# Patient Record
Sex: Male | Born: 1935 | Race: White | Hispanic: No | Smoking: Former smoker
Health system: Southern US, Community
[De-identification: ages and names within clinical notes are randomized; demographics above are authoritative.]

## PROBLEM LIST (undated history)

## (undated) DIAGNOSIS — K219 Gastro-esophageal reflux disease without esophagitis: Secondary | ICD-10-CM

## (undated) DIAGNOSIS — K625 Hemorrhage of anus and rectum: Secondary | ICD-10-CM

## (undated) DIAGNOSIS — R2689 Other abnormalities of gait and mobility: Secondary | ICD-10-CM

## (undated) DIAGNOSIS — E7800 Pure hypercholesterolemia, unspecified: Secondary | ICD-10-CM

## (undated) DIAGNOSIS — G8929 Other chronic pain: Secondary | ICD-10-CM

## (undated) DIAGNOSIS — N3281 Overactive bladder: Secondary | ICD-10-CM

## (undated) DIAGNOSIS — H579 Unspecified disorder of eye and adnexa: Secondary | ICD-10-CM

## (undated) DIAGNOSIS — L309 Dermatitis, unspecified: Secondary | ICD-10-CM

## (undated) DIAGNOSIS — K5909 Other constipation: Secondary | ICD-10-CM

## (undated) DIAGNOSIS — R0602 Shortness of breath: Secondary | ICD-10-CM

## (undated) DIAGNOSIS — I509 Heart failure, unspecified: Secondary | ICD-10-CM

## (undated) DIAGNOSIS — H9190 Unspecified hearing loss, unspecified ear: Secondary | ICD-10-CM

## (undated) DIAGNOSIS — M2560 Stiffness of unspecified joint, not elsewhere classified: Secondary | ICD-10-CM

## (undated) DIAGNOSIS — M869 Osteomyelitis, unspecified: Secondary | ICD-10-CM

## (undated) DIAGNOSIS — K649 Unspecified hemorrhoids: Secondary | ICD-10-CM

## (undated) DIAGNOSIS — I499 Cardiac arrhythmia, unspecified: Secondary | ICD-10-CM

## (undated) DIAGNOSIS — M419 Scoliosis, unspecified: Secondary | ICD-10-CM

## (undated) DIAGNOSIS — N390 Urinary tract infection, site not specified: Secondary | ICD-10-CM

## (undated) DIAGNOSIS — M48062 Spinal stenosis, lumbar region with neurogenic claudication: Secondary | ICD-10-CM

## (undated) DIAGNOSIS — M6281 Muscle weakness (generalized): Secondary | ICD-10-CM

## (undated) DIAGNOSIS — G43909 Migraine, unspecified, not intractable, without status migrainosus: Secondary | ICD-10-CM

## (undated) DIAGNOSIS — I10 Essential (primary) hypertension: Secondary | ICD-10-CM

## (undated) DIAGNOSIS — L989 Disorder of the skin and subcutaneous tissue, unspecified: Secondary | ICD-10-CM

## (undated) DIAGNOSIS — R0989 Other specified symptoms and signs involving the circulatory and respiratory systems: Secondary | ICD-10-CM

## (undated) DIAGNOSIS — Z950 Presence of cardiac pacemaker: Secondary | ICD-10-CM

## (undated) DIAGNOSIS — K1379 Other lesions of oral mucosa: Secondary | ICD-10-CM

## (undated) DIAGNOSIS — M1990 Unspecified osteoarthritis, unspecified site: Secondary | ICD-10-CM

## (undated) DIAGNOSIS — M2550 Pain in unspecified joint: Secondary | ICD-10-CM

## (undated) DIAGNOSIS — R197 Diarrhea, unspecified: Secondary | ICD-10-CM

## (undated) HISTORY — DX: Diarrhea, unspecified: R19.7

## (undated) HISTORY — DX: Gastro-esophageal reflux disease without esophagitis: K21.9

## (undated) HISTORY — DX: Unspecified hearing loss, unspecified ear: H91.90

## (undated) HISTORY — DX: Other chronic pain: G89.29

## (undated) HISTORY — DX: Heart failure, unspecified: I50.9

## (undated) HISTORY — DX: Other lesions of oral mucosa: K13.79

## (undated) HISTORY — DX: Osteomyelitis, unspecified: M86.9

## (undated) HISTORY — DX: Muscle weakness (generalized): M62.81

## (undated) HISTORY — DX: Other abnormalities of gait and mobility: R26.89

## (undated) HISTORY — DX: Unspecified hemorrhoids: K64.9

## (undated) HISTORY — DX: Unspecified disorder of eye and adnexa: H57.9

## (undated) HISTORY — DX: Cardiac arrhythmia, unspecified: I49.9

## (undated) HISTORY — DX: Other specified symptoms and signs involving the circulatory and respiratory systems: R09.89

## (undated) HISTORY — DX: Pain in unspecified joint: M25.50

## (undated) HISTORY — DX: Pure hypercholesterolemia, unspecified: E78.00

## (undated) HISTORY — DX: Other constipation: K59.09

## (undated) HISTORY — DX: Shortness of breath: R06.02

## (undated) HISTORY — DX: Hemorrhage of anus and rectum: K62.5

## (undated) HISTORY — DX: Disorder of the skin and subcutaneous tissue, unspecified: L98.9

## (undated) HISTORY — DX: Essential (primary) hypertension: I10

## (undated) HISTORY — DX: Presence of cardiac pacemaker: Z95.0

## (undated) HISTORY — PX: INSERT / REPLACE / REMOVE PACEMAKER: SUR710

## (undated) HISTORY — DX: Scoliosis, unspecified: M41.9

## (undated) HISTORY — DX: Unspecified osteoarthritis, unspecified site: M19.90

## (undated) HISTORY — DX: Dermatitis, unspecified: L30.9

## (undated) HISTORY — DX: Spinal stenosis, lumbar region with neurogenic claudication: M48.062

## (undated) HISTORY — DX: Migraine, unspecified, not intractable, without status migrainosus: G43.909

## (undated) HISTORY — DX: Stiffness of unspecified joint, not elsewhere classified: M25.60

## (undated) HISTORY — DX: Urinary tract infection, site not specified: N39.0

## (undated) HISTORY — PX: DENTAL SURGERY: SHX609

## (undated) HISTORY — PX: OTHER SURGICAL HISTORY: SHX169

## (undated) HISTORY — DX: Overactive bladder: N32.81

---

## 2017-06-01 ENCOUNTER — Encounter: Payer: Self-pay | Admitting: Internal Medicine

## 2018-12-14 ENCOUNTER — Encounter: Payer: Self-pay | Admitting: Internal Medicine

## 2019-08-03 ENCOUNTER — Encounter: Payer: Self-pay | Admitting: Internal Medicine

## 2019-08-03 ENCOUNTER — Ambulatory Visit (INDEPENDENT_AMBULATORY_CARE_PROVIDER_SITE_OTHER): Payer: Medicare Other | Admitting: Internal Medicine

## 2019-08-03 ENCOUNTER — Other Ambulatory Visit: Payer: Self-pay

## 2019-08-03 VITALS — BP 118/64 | HR 70 | Ht 67.0 in | Wt 133.4 lb

## 2019-08-03 DIAGNOSIS — R0609 Other forms of dyspnea: Secondary | ICD-10-CM | POA: Diagnosis not present

## 2019-08-03 DIAGNOSIS — Z950 Presence of cardiac pacemaker: Secondary | ICD-10-CM

## 2019-08-03 DIAGNOSIS — R0600 Dyspnea, unspecified: Secondary | ICD-10-CM

## 2019-08-03 DIAGNOSIS — I442 Atrioventricular block, complete: Secondary | ICD-10-CM | POA: Diagnosis not present

## 2019-08-03 LAB — CUP PACEART INCLINIC DEVICE CHECK
Brady Statistic RA Percent Paced: 4 %
Brady Statistic RV Percent Paced: 99 %
Date Time Interrogation Session: 20200923040000
Implantable Lead Implant Date: 20090901
Implantable Lead Implant Date: 20090901
Implantable Lead Location: 753859
Implantable Lead Location: 753860
Implantable Lead Model: 4076
Implantable Lead Model: 4076
Implantable Pulse Generator Implant Date: 20180723
Lead Channel Impedance Value: 348 Ohm
Lead Channel Impedance Value: 894 Ohm
Lead Channel Pacing Threshold Amplitude: 0.7 V
Lead Channel Pacing Threshold Amplitude: 0.8 V
Lead Channel Pacing Threshold Pulse Width: 0.4 ms
Lead Channel Pacing Threshold Pulse Width: 0.4 ms
Lead Channel Sensing Intrinsic Amplitude: 5.3 mV
Lead Channel Setting Pacing Amplitude: 1.1 V
Lead Channel Setting Pacing Amplitude: 2 V
Lead Channel Setting Pacing Pulse Width: 0.4 ms
Lead Channel Setting Sensing Sensitivity: 2.5 mV
Pulse Gen Serial Number: 357327

## 2019-08-03 NOTE — Progress Notes (Signed)
ELECTROPHYSIOLOGY CONSULT NOTE  Patient ID: Justin Richmond, MRN: [id number], DOB/AGE: 12/22/1923 85 y.o. Admit date: (Not on file) Date of Consult: 08/03/2019  Primary Physician: Shirline Frees, MD Primary Cardiologist: new     Justin Richmond is a 85 y.o. male who is being seen today for the evaluation of pacemaker at the request of Dr Kenton Kingfisher .    HPI Justin Richmond is a 85 y.o. male SEEN to establish pacemaker followup.  Implanted for syncope 2009 with generator replacement 2018  No recurrent syncope  Largely nonambulatory 2/2 DOE but also limited by debility and weakness.  Dyspnea has been incompletely evaluated.  CT scan showed interstitial lung disease.  Pulmonary consultation was considered but never consummated.  There is no accompanying chest discomfort not withstanding his known coronary artery disease See Below   DATE TEST EF   2002 LHC    % Ramus severe Mild diffuse disease  11/14 MYOVIEW  36 % Mod anteroapical Fixed defect  6/18 Echo  74%     Past Medical History:  Diagnosis Date  . Chronic constipation   . Hemorrhoids   . Hypertension   . OAB (overactive bladder)   . Pacemaker   . Pure hypercholesterolemia   . Recurrent UTI   . Spinal stenosis, lumbar region with neurogenic claudication       Surgical History:  Past Surgical History:  Procedure Laterality Date  . INSERT / REPLACE / REMOVE PACEMAKER       Home Meds: Current Meds  Medication Sig  . aspirin EC 81 MG tablet Take 81 mg by mouth daily.  . carvedilol (COREG) 6.25 MG tablet Take 6.25 mg by mouth 2 (two) times daily with a meal.  . ROSUVASTATIN CALCIUM PO Take by mouth.    Allergies: No Known Allergies  Social History   Socioeconomic History  . Marital status: Unknown    Spouse name: Not on file  . Number of children: Not on file  . Years of education: Not on file  . Highest education level: Not on file  Occupational History  . Not on file  Social Needs  . Financial  resource strain: Not on file  . Food insecurity    Worry: Not on file    Inability: Not on file  . Transportation needs    Medical: Not on file    Non-medical: Not on file  Tobacco Use  . Smoking status: Former Smoker    Years: 2.00    Types: Cigarettes  . Smokeless tobacco: Never Used  Substance and Sexual Activity  . Alcohol use: Not on file  . Drug use: Not on file  . Sexual activity: Not on file  Lifestyle  . Physical activity    Days per week: Not on file    Minutes per session: Not on file  . Stress: Not on file  Relationships  . Social Herbalist on phone: Not on file    Gets together: Not on file    Attends religious service: Not on file    Active member of club or organization: Not on file    Attends meetings of clubs or organizations: Not on file    Relationship status: Not on file  . Intimate partner violence    Fear of current or ex partner: Not on file    Emotionally abused: Not on file    Physically abused: Not on file    Forced sexual activity: Not on file  Other  Topics Concern  . Not on file  Social History Narrative  . Not on file     No family history on file.   ROS:  Please see the history of present illness.     All other systems reviewed and negative.    Physical Exam: Blood pressure 118/64, pulse 70, height 5\' 7"  (1.702 m), weight 133 lb 6.4 oz (60.5 kg), SpO2 97 %. General: Well developed, well nourished male in no acute distress sitting in a wheel chair Head: Normocephalic, atraumatic, sclera non-icteric, no xanthomas, nares are without discharge. EENT: normal  Lymph Nodes:  none Neck: Negative for carotid bruits. JVD not elevated. Back:without scoliosis kyphosis Lungs: Clear bilaterally to auscultation without wheezes, rales, or rhonchi. Breathing is unlabored. Heart: RRR with S1 S2. No murmur . No rubs, or gallops appreciated. Abdomen: Soft, non-tender, non-distended with normoactive bowel sounds. No hepatomegaly. No  rebound/guarding. No obvious abdominal masses. Msk:  Strength and tone appear normal for age. Extremities: No clubbing or cyanosis. No edema.  Distal pedal pulses are 2+ and equal bilaterally. Skin: Warm and Dry Neuro: Alert and oriented X 3. CN III-XII intact Grossly normal sensory and motor function . Psych:  Responds to questions appropriately with a normal affect.      Labs: Cardiac Enzymes No results for input(s): CKTOTAL, CKMB, TROPONINI in the last 72 hours. CBC No results found for: WBC, HGB, HCT, MCV, PLT PROTIME: No results for input(s): LABPROT, INR in the last 72 hours. Chemistry No results for input(s): NA, K, CL, CO2, BUN, CREATININE, CALCIUM, PROT, BILITOT, ALKPHOS, ALT, AST, GLUCOSE in the last 168 hours.  Invalid input(s): LABALBU Lipids No results found for: CHOL, HDL, LDLCALC, TRIG BNP No results found for: PROBNP Thyroid Function Tests: No results for input(s): TSH, T4TOTAL, T3FREE, THYROIDAB in the last 72 hours.  Invalid input(s): FREET3 Miscellaneous No results found for: DDIMER  Radiology/Studies:  No results found.  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  Pacemaker function is normal/adequate.  Polarity switch occurred because of high bipolar impedance.  This normalized with reprogramming to unipolar.  I have reviewed the issue with the family.  Dyspnea is likely multifactorial related to debility, interstitial lung disease noted on CT scanning, and possibly some coronary artery disease resulting in ischemia.  Have suggested a follow-up with Dr. 01-30-2005 regarding further evaluation of his dyspnea, physical therapy as well as consideration of evaluation of his neuropathy.   We will see him in 3 to 4 months.         Tiburcio Pea

## 2019-08-03 NOTE — Patient Instructions (Addendum)
Medication Instructions:  Your physician recommends that you continue on your current medications as directed. Please refer to the Current Medication list given to you today.  *If you need a refill on your cardiac medications before your next appointment, please call your pharmacy*  Labwork: None ordered  Testing/Procedures: None ordered  Follow-Up: Remote monitoring is used to monitor your Pacemaker of ICD from home. This monitoring reduces the number of office visits required to check your device to one time per year. It allows [place] to keep an eye on the functioning of your device to ensure it is working properly. You are scheduled for a device check from home on 11/02/19. You may send your transmission at any time that day. If you have a wireless device, the transmission will be sent automatically. After your physician reviews your transmission, you will receive a postcard with your next transmission date.  Your physician recommends that you schedule a follow-up appointment in: 4 months with Dr. Caryl Comes.   Thank you for choosing [facility]!!   [phone]  Any Other Special Instructions Will Be Listed Below (If Applicable).

## 2019-09-07 ENCOUNTER — Other Ambulatory Visit: Payer: Self-pay

## 2019-09-07 ENCOUNTER — Ambulatory Visit (INDEPENDENT_AMBULATORY_CARE_PROVIDER_SITE_OTHER): Payer: Medicare Other | Admitting: Podiatry

## 2019-09-07 ENCOUNTER — Encounter: Payer: Self-pay | Admitting: Podiatry

## 2019-09-07 VITALS — BP 135/83 | HR 82

## 2019-09-07 DIAGNOSIS — G629 Polyneuropathy, unspecified: Secondary | ICD-10-CM | POA: Diagnosis not present

## 2019-09-07 DIAGNOSIS — B351 Tinea unguium: Secondary | ICD-10-CM | POA: Diagnosis not present

## 2019-09-07 DIAGNOSIS — M79674 Pain in right toe(s): Secondary | ICD-10-CM

## 2019-09-07 DIAGNOSIS — M79675 Pain in left toe(s): Secondary | ICD-10-CM | POA: Diagnosis not present

## 2019-09-09 NOTE — Progress Notes (Signed)
Subjective:   Patient ID: Justin Richmond, male   DOB: 85 y.o.   MRN: [id number]   HPI Patient states he gets some tingling in his feet if he is been on them a long time but he is having a lot of problems with his nails that have become very thick and incurvated and they cannot cut them patient presents with caregiver today.  Patient does not smoke likes to be active if possible   Review of Systems  All other systems reviewed and are negative.       Objective:  Physical Exam Vitals signs and nursing note reviewed.  Constitutional:      Appearance: He is well-developed.  Pulmonary:     Effort: Pulmonary effort is normal.  Musculoskeletal: Normal range of motion.  Skin:    General: Skin is warm.  Neurological:     Mental Status: He is alert.     Neurovascular status was found to be mildly diminished bilateral with patient found to have thick yellow brittle nailbeds 1-5 both feet that are dystrophic and moderately painful with palpation.  Patient is noted to have mild diminishment of digital perfusion and does have diminished muscle strength bilateral      Assessment:  Chronic mycotic nail infection with pain 1-5 both feet with possible mild neuropathic symptoms commensurate with age     Plan:  H&P reviewed conditions and at this point I did debride nailbeds 1-5 both feet with no iatrogenic bleeding and I recommended continuation of this treatment.  Patient will be seen back to recheck

## 2019-11-02 ENCOUNTER — Ambulatory Visit (INDEPENDENT_AMBULATORY_CARE_PROVIDER_SITE_OTHER): Payer: Medicare Other | Admitting: *Deleted

## 2019-11-02 DIAGNOSIS — I442 Atrioventricular block, complete: Secondary | ICD-10-CM

## 2019-11-03 LAB — CUP PACEART REMOTE DEVICE CHECK
Date Time Interrogation Session: 20201223055718
Implantable Lead Implant Date: 20090901
Implantable Lead Implant Date: 20090901
Implantable Lead Location: 753859
Implantable Lead Location: 753860
Implantable Lead Model: 4076
Implantable Lead Model: 4076
Implantable Pulse Generator Implant Date: 20180723
Pulse Gen Serial Number: 357327

## 2019-12-09 ENCOUNTER — Encounter: Payer: Self-pay | Admitting: Podiatry

## 2019-12-09 ENCOUNTER — Ambulatory Visit (INDEPENDENT_AMBULATORY_CARE_PROVIDER_SITE_OTHER): Payer: Medicare Other | Admitting: Podiatry

## 2019-12-09 ENCOUNTER — Other Ambulatory Visit: Payer: Self-pay

## 2019-12-09 DIAGNOSIS — G629 Polyneuropathy, unspecified: Secondary | ICD-10-CM

## 2019-12-09 DIAGNOSIS — B351 Tinea unguium: Secondary | ICD-10-CM

## 2019-12-09 DIAGNOSIS — M79675 Pain in left toe(s): Secondary | ICD-10-CM | POA: Diagnosis not present

## 2019-12-09 DIAGNOSIS — M79674 Pain in right toe(s): Secondary | ICD-10-CM

## 2019-12-09 NOTE — Progress Notes (Signed)
Complaint:  Visit Type: Patient returns to my office for continued preventative foot care services. Complaint: Patient states" my nails have grown long and thick and become painful to walk and wear shoes" Patient has been diagnosed with neuropathy.. The patient presents for preventative foot care services.  Podiatric Exam: Vascular: dorsalis pedis and posterior tibial pulses are palpable bilateral. Capillary return is immediate. Temperature gradient is WNL. Skin turgor WNL  Sensorium: Normal Semmes Weinstein monofilament test. Normal tactile sensation bilaterally. Nail Exam: Pt has thick disfigured discolored nails with subungual debris noted bilateral entire nail hallux through fifth toenails Ulcer Exam: There is no evidence of ulcer or pre-ulcerative changes or infection. Orthopedic Exam: Muscle tone and strength are WNL. No limitations in general ROM. No crepitus or effusions noted. Foot type and digits show no abnormalities. Bony prominences are unremarkable. Skin: No Porokeratosis. No infection or ulcers  Diagnosis:  Onychomycosis, , Pain in right toe, pain in left toes  Treatment & Plan Procedures and Treatment: Consent by patient was obtained for treatment procedures.   Debridement of mycotic and hypertrophic toenails, 1 through 5 bilateral and clearing of subungual debris. No ulceration, no infection noted.  Return Visit-Office Procedure: Patient instructed to return to the office for a follow up visit 4 months for continued evaluation and treatment.    Helane Gunther DPM

## 2019-12-13 ENCOUNTER — Encounter: Payer: Medicare Other | Admitting: Student

## 2019-12-15 ENCOUNTER — Encounter: Payer: Medicare Other | Admitting: Student

## 2020-02-01 ENCOUNTER — Ambulatory Visit (INDEPENDENT_AMBULATORY_CARE_PROVIDER_SITE_OTHER): Payer: Medicare Other | Admitting: *Deleted

## 2020-02-01 DIAGNOSIS — I442 Atrioventricular block, complete: Secondary | ICD-10-CM | POA: Diagnosis not present

## 2020-02-02 LAB — CUP PACEART REMOTE DEVICE CHECK
Battery Remaining Longevity: 90 mo
Battery Remaining Percentage: 100 %
Brady Statistic RA Percent Paced: 3 %
Brady Statistic RV Percent Paced: 99 %
Date Time Interrogation Session: 20210325094700
Implantable Lead Implant Date: 20090901
Implantable Lead Implant Date: 20090901
Implantable Lead Location: 753859
Implantable Lead Location: 753860
Implantable Lead Model: 4076
Implantable Lead Model: 4076
Implantable Pulse Generator Implant Date: 20180723
Lead Channel Impedance Value: 323 Ohm
Lead Channel Impedance Value: 927 Ohm
Lead Channel Pacing Threshold Amplitude: 0.8 V
Lead Channel Pacing Threshold Pulse Width: 0.4 ms
Lead Channel Setting Pacing Amplitude: 1.3 V
Lead Channel Setting Pacing Amplitude: 2 V
Lead Channel Setting Pacing Pulse Width: 0.4 ms
Lead Channel Setting Sensing Sensitivity: 2.5 mV
Pulse Gen Serial Number: 357327

## 2020-02-09 ENCOUNTER — Encounter: Payer: Self-pay | Admitting: *Deleted

## 2020-02-09 ENCOUNTER — Ambulatory Visit: Payer: Medicare Other | Admitting: Neurology

## 2020-02-09 ENCOUNTER — Telehealth: Payer: Self-pay | Admitting: Neurology

## 2020-02-09 NOTE — Telephone Encounter (Signed)
Patient showed up 10 minutes late to his appointment. I advised him that we would have to reschedule. Patient did not wish to reschedule.

## 2020-03-28 NOTE — Progress Notes (Signed)
[facility]    Provider:  Dr Jaynee Eagles Requesting Provider: Shirline Frees, MD, Suella Broad MD Primary Care Provider:  Shirline Frees, MD  CC:  Numbness in the feet  HPI:  Justin Richmond is a 86 y.o. male here as requested by Suella Broad and Dr. Kenton Kingfisher for numbness, weakness in legs, spinal stenosis but no pain.  Past medical history spinal stenosis of the lumbar region with neurogenic claudication, scoliosis, hypercholesterolemia, poor circulation, pacemaker for complete heart block, osteoarthritis, muscle weakness, migraine, joint stiffness and pain, hypertension, hearing loss, balance problems.  I reviewed Dr. Lurena Nida notes: His chief complaint was 5 years of low back pain, right leg pain and neck pain as well, 5 years "nagging pain", uses Tylenol or Aleve, previous injections gave him some nerve blocks in the past which was 5 years ago which lasted several years.  He also reports numbness in both of his feet, not a great historian accompanied with his daughter, also bilateral lower limb numbness, no pain more numbness, no history of diabetes.  Strength was 5 out of 5 but reflexes were difficult to get in either lower extremity, Hoffmann sign negative, he was seated in a wheelchair.  Not really complaining of pain in his neck or lumbar spine however biggest complaint is numbness and paresthesias in both lower extremities.  I was also able to review notes from Dr. Kenton Kingfisher who is his primary care, physical exam by Dr. Kenton Kingfisher appeared unremarkable, he was referred here for weakness of both legs by Dr. Kenton Kingfisher as well.  It started with low back pain and radiculopathy. He had 3 injections in the low back and the pain improved. He did not have this prior to the back pain. It has bene stable since the not worsening but not improving. The right one is worse. He has a difficult time telling me where, he have numbness up to the right knee, an din the left it is in the foot. No new  medication, not exposed to toxins, no cancer therapies. He lived for tell years at a care facility. Just numb. No burning or pain, he is having electrical shocks not painful but noticeable, numbness is continuous all day and all night, worse at night because he is inactive.Hand and feet always cold. No FHx of neuropathy. His son is diabetic but no history of diabetes. Here with daughter who provides much information.   Reviewed notes, labs and imaging from outside physicians, which showed:  See above  Review of Systems: Patient complains of symptoms per HPI as well as the following symptoms: low back pain ad numbness. Pertinent negatives and positives per HPI. All others negative.    Social History   Socioeconomic History  . Marital status: Widowed    Spouse name: Not on file  . Number of children: 4  . Years of education: Not on file  . Highest education level: Professional school degree (e.g., MD, DDS, DVM, JD)  Occupational History  . Not on file  Tobacco Use  . Smoking status: Former Smoker    Years: 2.00    Types: Cigarettes  . Smokeless tobacco: Never Used  Substance and Sexual Activity  . Alcohol use: Never    Comment: in college  . Drug use: Not on file    Comment: no  . Sexual activity: Not on file  Other Topics Concern  . Not on file  Social History Narrative   Lives with daughter   Right handed   Caffeine: 1 cup/day  Social Determinants of Health   Financial Resource Strain:   . Difficulty of Paying Living Expenses:   Food Insecurity:   . Worried About Charity fundraiser in the Last Year:   . Arboriculturist in the Last Year:   Transportation Needs:   . Film/video editor (Medical):   Marland Kitchen Lack of Transportation (Non-Medical):   Physical Activity:   . Days of Exercise per Week:   . Minutes of Exercise per Session:   Stress:   . Feeling of Stress :   Social Connections:   . Frequency of Communication with Friends and Family:   . Frequency of Social  Gatherings with Friends and Family:   . Attends Religious Services:   . Active Member of Clubs or Organizations:   . Attends Archivist Meetings:   Marland Kitchen Marital Status:   Intimate Partner Violence:   . Fear of Current or Ex-Partner:   . Emotionally Abused:   Marland Kitchen Physically Abused:   . Sexually Abused:     Family History  Problem Relation Age of Onset  . Stroke Mother   . Other Father        "black lung disease"  . Cancer Sister   . Ankylosing spondylitis Brother     Past Medical History:  Diagnosis Date  . Arrhythmia   . Balance problems   . Chronic back pain   . Chronic constipation   . Congestive heart failure ([place])   . Diarrhea   . Eczema   . Eye disease    cataracts/glaucoma  . GERD (gastroesophageal reflux disease)   . Hearing loss   . Hemorrhoids   . High cholesterol   . Hypertension   . Irregular heartbeat   . Joint pain   . Joint stiffness   . Migraine headache   . Mouth sores   . Muscle weakness   . OAB (overactive bladder)   . Osteoarthritis   . Pacemaker   . Poor circulation   . Pure hypercholesterolemia   . Rectal bleeding   . Recurrent UTI   . Scoliosis   . Shortness of breath   . Skin disorder   . Spinal stenosis, lumbar region with neurogenic claudication     Patient Active Problem List   Diagnosis Date Noted  . Peripheral polyneuropathy 04/02/2020  . Spinal stenosis of lumbar region with neurogenic claudication 04/02/2020    Past Surgical History:  Procedure Laterality Date  . DENTAL SURGERY     tooth removal, root canals  . INSERT / REPLACE / REMOVE PACEMAKER    . osteomyelitis surgery     x10 surgeries  . SPINAL INJECTION     x3     Current Outpatient Medications  Medication Sig Dispense Refill  . aspirin EC 81 MG tablet Take 81 mg by mouth daily.    . carvedilol (COREG) 6.25 MG tablet Take 6.25 mg by mouth 2 (two) times daily with a meal.    . pantoprazole (PROTONIX) 40 MG tablet Take 40 mg by mouth daily.    .  rosuvastatin (CRESTOR) 10 MG tablet      No current facility-administered medications for this visit.    Allergies as of 03/29/2020 - Review Complete 03/29/2020  Allergen Reaction Noted  . Penicillins  03/29/2020    Vitals: BP (!) 184/97 (BP Location: Right Arm, Patient Position: Sitting) Comment: "it happens everytime I go to a doctors office"  Pulse 75   Ht '5\' 7"'  (1.702 m)  Wt 143 lb (64.9 kg)   BMI 22.40 kg/m  Last Weight:  Wt Readings from Last 1 Encounters:  03/29/20 143 lb (64.9 kg)   Last Height:   Ht Readings from Last 1 Encounters:  03/29/20 '5\' 7"'  (1.702 m)     Physical exam: Exam: Gen: NAD, conversant, looks much younger than stated age                    CV: RRR, no MRG. No Carotid Bruits.  mild swelling in the distal lower right leg., warm, nontender Eyes: Conjunctivae clear without exudates or hemorrhage  Neuro: Detailed Neurologic Exam  Speech:    Speech is normal; fluent and spontaneous with normal comprehension.  Cognition:    The patient is oriented to person, place, and time;     recent and remote memory intact;     language fluent;     normal attention, concentration,     fund of knowledge Cranial Nerves:    The pupils are equal, round, pinpoint. Attempted fundoscopy could not visualize due to small pupils. Visual fields are full to finger confrontation. Extraocular movements are intact. Trigeminal sensation is intact and the muscles of mastication are normal. The face is symmetric. The palate elevates in the midline. Hearing intact. Voice is normal. Shoulder shrug is normal. The tongue has normal motion without fasciculations.   Coordination: No dysmetria  Gait:    Uses a walker, slow but not shuffling or ataxic  Motor Observation:  Left lower leg with atrophy and a large scar (chronic from age 63). left knee fusion cannot bed.  Tone:    Normal muscle tone.    Posture:    Posture is normal. normal erect    Strength: very minimal prox  weakness but symmetrical and not pathological.     Strength is V/V in the upper and lower limbs.      Sensation: Left foot intact pp, right foot decreased pp to the ankle, decreased temp left foot and the right as well. Intact vibration a few seconds, impaired proprioception.       Reflex Exam:  DTR's:    left knee fusion cannot bed. Trace AJs, 1+ right patellar, biceps 1+.   Toes:    The toes are downgoing bilaterally.   Clonus:    Clonus is absent.    Assessment/Plan:  86 y.o. male here as requested by Suella Broad and Dr. Kenton Kingfisher for numbness, weakness in legs, spinal stenosis but no pain.  Past medical history spinal stenosis of the lumbar region with neurogenic claudication, scoliosis, hypercholesterolemia, poor circulation, pacemaker for complete heart block, osteoarthritis, muscle weakness, migraine, joint stiffness and pain, hypertension, hearing loss, balance problems  -86 year old patient who looks much younger than stated age.  He has an interesting story he states that he developed lumbar stenosis with radiculopathy and current numbness in the feet at the same time.  Pain management with injections improve the radicular pain however he is left with stable numbness in the bilateral lower extremities.  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  I did explain to patient and daughter that after many years it is unlikely numbness will improve with any treatment however we will perform a complete serum neuropathy panel  - and EMG nerve conduction study only on the right leg.   Orders Placed This Encounter  Procedures  .  Hemoglobin A1c  . B. burgdorfi Antibody  . Vitamin B1  . TSH  . Sedimentation rate  . Sjogren's syndrome antibods(ssa + ssb)  . B12 and Folate Panel  . Tissue transglutaminase, IgA  . Gliadin antibodies, serum  .  Rheumatoid factor  . Heavy metals, blood  . Vitamin B6  . Multiple Myeloma Panel (SPEP&IFE w/QIG)  . Methylmalonic acid, serum  . ANA, IFA (with reflex)  . CBC  . Comprehensive metabolic panel  . NCV with EMG(electromyography)   No orders of the defined types were placed in this encounter.   Cc: Shirline Frees, MD,   Suella Broad MD  Sarina Ill, MD  [facility] [address] [place], [place] [address]  Phone [phone] Fax [phone]  I spent 60  minutes of face-to-face and non-face-to-face time with patient on the  1. Peripheral polyneuropathy   2. Spinal stenosis of lumbar region with neurogenic claudication    diagnosis.  This included previsit chart review, lab review, study review, order entry, electronic health record documentation, patient education on the different diagnostic and therapeutic options, counseling and coordination of care, risks and benefits of management, compliance, or risk factor reduction

## 2020-03-29 ENCOUNTER — Other Ambulatory Visit: Payer: Self-pay

## 2020-03-29 ENCOUNTER — Ambulatory Visit (INDEPENDENT_AMBULATORY_CARE_PROVIDER_SITE_OTHER): Payer: Medicare Other | Admitting: Neurology

## 2020-03-29 ENCOUNTER — Encounter: Payer: Self-pay | Admitting: Neurology

## 2020-03-29 VITALS — BP 184/97 | HR 75 | Ht 67.0 in | Wt 143.0 lb

## 2020-03-29 DIAGNOSIS — M48062 Spinal stenosis, lumbar region with neurogenic claudication: Secondary | ICD-10-CM | POA: Diagnosis not present

## 2020-03-29 DIAGNOSIS — G629 Polyneuropathy, unspecified: Secondary | ICD-10-CM | POA: Diagnosis not present

## 2020-03-29 NOTE — Patient Instructions (Addendum)
Peripheral Neuropathy Peripheral neuropathy is a type of nerve damage. It affects nerves that carry signals between the spinal cord and the arms, legs, and the rest of the body (peripheral nerves). It does not affect nerves in the spinal cord or brain. In peripheral neuropathy, one nerve or a group of nerves may be damaged. Peripheral neuropathy is a broad category that includes many specific nerve disorders, like diabetic neuropathy, hereditary neuropathy, and carpal tunnel syndrome. What are the causes? This condition may be caused by:  Diabetes. This is the most common cause of peripheral neuropathy.  Nerve injury.  Pressure or stress on a nerve that lasts a long time.  Lack (deficiency) of B vitamins. This can result from alcoholism, poor diet, or a restricted diet.  Infections.  Autoimmune diseases, such as rheumatoid arthritis and systemic lupus erythematosus.  Nerve diseases that are passed from parent to child (inherited).  Some medicines, such as cancer medicines (chemotherapy).  Poisonous (toxic) substances, such as lead and mercury.  Too little blood flowing to the legs.  Kidney disease.  Thyroid disease. In some cases, the cause of this condition is not known. What are the signs or symptoms? Symptoms of this condition depend on which of your nerves is damaged. Common symptoms include:  Loss of feeling (numbness) in the feet, hands, or both.  Tingling in the feet, hands, or both.  Burning pain.  Very sensitive skin.  Weakness.  Not being able to move a part of the body (paralysis).  Muscle twitching.  Clumsiness or poor coordination.  Loss of balance.  Not being able to control your bladder.  Feeling dizzy.  Sexual problems. How is this diagnosed? Diagnosing and finding the cause of peripheral neuropathy can be difficult. Your health care provider will take your medical history and do a physical exam. A neurological exam will also be done. This  involves checking things that are affected by your brain, spinal cord, and nerves (nervous system). For example, your health care provider will check your reflexes, how you move, and what you can feel. You may have other tests, such as:  Blood tests.  Electromyogram (EMG) and nerve conduction tests. These tests check nerve function and how well the nerves are controlling the muscles.  Imaging tests, such as CT scans or MRI to rule out other causes of your symptoms.  Removing a small piece of nerve to be examined in a lab (nerve biopsy). This is rare.  Removing and examining a small amount of the fluid that surrounds the brain and spinal cord (lumbar puncture). This is rare. How is this treated? Treatment for this condition may involve:  Treating the underlying cause of the neuropathy, such as diabetes, kidney disease, or vitamin deficiencies.  Stopping medicines that can cause neuropathy, such as chemotherapy.  Medicine to relieve pain. Medicines may include: ? Prescription or over-the-counter pain medicine. ? Antiseizure medicine. ? Antidepressants. ? Pain-relieving patches that are applied to painful areas of skin.  Surgery to relieve pressure on a nerve or to destroy a nerve that is causing pain.  Physical therapy to help improve movement and balance.  Devices to help you move around (assistive devices). Follow these instructions at home: Medicines  Take over-the-counter and prescription medicines only as told by your health care provider. Do not take any other medicines without first asking your health care provider.  Do not drive or use heavy machinery while taking prescription pain medicine. Lifestyle   Do not use any products that  contain nicotine or tobacco, such as cigarettes and e-cigarettes. Smoking keeps blood from reaching damaged nerves. If you need help quitting, ask your health care provider.  Avoid or limit alcohol. Too much alcohol can cause a vitamin B  deficiency, and vitamin B is needed for healthy nerves.  Eat a healthy diet. This includes: ? Eating foods that are high in fiber, such as fresh fruits and vegetables, whole grains, and beans. ? Limiting foods that are high in fat and processed sugars, such as fried or sweet foods. General instructions   If you have diabetes, work closely with your health care provider to keep your blood sugar under control.  If you have numbness in your feet: ? Check every day for signs of injury or infection. Watch for redness, warmth, and swelling. ? Wear padded socks and comfortable shoes. These help protect your feet.  Develop a good support system. Living with peripheral neuropathy can be stressful. Consider talking with a mental health specialist or joining a support group.  Use assistive devices and attend physical therapy as told by your health care provider. This may include using a walker or a cane.  Keep all follow-up visits as told by your health care provider. This is important. Contact a health care provider if:  You have new signs or symptoms of peripheral neuropathy.  You are struggling emotionally from dealing with peripheral neuropathy.  Your pain is not well-controlled. Get help right away if:  You have an injury or infection that is not healing normally.  You develop new weakness in an arm or leg.  You fall frequently. Summary  Peripheral neuropathy is when the nerves in the arms, or legs are damaged, resulting in numbness, weakness, or pain.  There are many causes of peripheral neuropathy, including diabetes, pinched nerves, vitamin deficiencies, autoimmune disease, and hereditary conditions.  Diagnosing and finding the cause of peripheral neuropathy can be difficult. Your health care provider will take your medical history, do a physical exam, and do tests, including blood tests and nerve function tests.  Treatment involves treating the underlying cause of the  neuropathy and taking medicines to help control pain. Physical therapy and assistive devices may also help. This information is not intended to replace advice given to you by your health care provider. Make sure you discuss any questions you have with your health care provider. Document Revised: 10/09/2017 Document Reviewed: 01/05/2017 Elsevier Patient Education  2020 [employer]    Electromyoneurogram Electromyoneurogram is a test to check how well your muscles and nerves are working. This procedure includes the combined use of electromyogram (EMG) and nerve conduction study (NCS). EMG is used to look for muscular disorders. NCS, which is also called electroneurogram, measures how well your nerves are controlling your muscles. The procedures are usually done together to check if your muscles and nerves are healthy. If the results of the tests are abnormal, this may indicate disease or injury, such as a neuromuscular disease or peripheral nerve damage. Tell a health care provider about:  Any allergies you have.  All medicines you are taking, including vitamins, herbs, eye drops, creams, and over-the-counter medicines.  Any problems you or family members have had with anesthetic medicines.  Any blood disorders you have.  Any surgeries you have had.  Any medical conditions you have.  If you have a pacemaker.  Whether you are pregnant or may be pregnant. What are the risks? Generally, this is a safe procedure. However, problems may occur, including:  Infection  where the electrodes were inserted.  Bleeding. What happens before the procedure? Medicines Ask your health care provider about:  Changing or stopping your regular medicines. This is especially important if you are taking diabetes medicines or blood thinners.  Taking medicines such as aspirin and ibuprofen. These medicines can thin your blood. Do not take these medicines unless your health care provider tells you to take  them.  Taking over-the-counter medicines, vitamins, herbs, and supplements. General instructions  Your health care provider may ask you to avoid: ? Beverages that have caffeine, such as coffee and tea. ? Any products that contain nicotine or tobacco. These products include cigarettes, e-cigarettes, and chewing tobacco. If you need help quitting, ask your health care provider.  Do not use lotions or creams on the same day that you will be having the procedure. What happens during the procedure? For EMG   Your health care provider will ask you to stay in a position so that he or she can access the muscle that will be studied. You may be standing, sitting, or lying down.  You may be given a medicine that numbs the area (local anesthetic).  A very thin needle that has an electrode will be inserted into your muscle.  Another small electrode will be placed on your skin near the muscle.  Your health care provider will ask you to continue to remain still.  The electrodes will send a signal that tells about the electrical activity of your muscles. You may see this on a monitor or hear it in the room.  After your muscles have been studied at rest, your health care provider will ask you to contract or flex your muscles. The electrodes will send a signal that tells about the electrical activity of your muscles.  Your health care provider will remove the electrodes and the electrode needles when the procedure is finished. The procedure may vary among health care providers and hospitals. For NCS   An electrode that records your nerve activity (recording electrode) will be placed on your skin by the muscle that is being studied.  An electrode that is used as a reference (reference electrode) will be placed near the recording electrode.  A paste or gel will be applied to your skin between the recording electrode and the reference electrode.  Your nerve will be stimulated with a mild shock.  Your health care provider will measure how much time it takes for your muscle to react.  Your health care provider will remove the electrodes and the gel when the procedure is finished. The procedure may vary among health care providers and hospitals. What happens after the procedure?  It is up to you to get the results of your procedure. Ask your health care provider, or the department that is doing the procedure, when your results will be ready.  Your health care provider may: ? Give you medicines for any pain. ? Monitor the insertion sites to make sure that bleeding stops. Summary  Electromyoneurogram is a test to check how well your muscles and nerves are working.  If the results of the tests are abnormal, this may indicate disease or injury.  This is a safe procedure. However, problems may occur, such as bleeding and infection.  Your health care provider will do two tests to complete this procedure. One checks your muscles (EMG) and another checks your nerves (NCS).  It is up to you to get the results of your procedure. Ask your health care provider, or  the department that is doing the procedure, when your results will be ready. This information is not intended to replace advice given to you by your health care provider. Make sure you discuss any questions you have with your health care provider. Document Revised: 07/13/2018 Document Reviewed: 06/25/2018 Elsevier Patient Education  [place].

## 2020-04-02 ENCOUNTER — Telehealth: Payer: Self-pay | Admitting: Emergency Medicine

## 2020-04-02 ENCOUNTER — Encounter: Payer: Self-pay | Admitting: Neurology

## 2020-04-02 DIAGNOSIS — M48062 Spinal stenosis, lumbar region with neurogenic claudication: Secondary | ICD-10-CM | POA: Insufficient documentation

## 2020-04-02 DIAGNOSIS — G629 Polyneuropathy, unspecified: Secondary | ICD-10-CM | POA: Insufficient documentation

## 2020-04-02 NOTE — Telephone Encounter (Signed)
Remote transmission sent to verify RV threshold was 0.5 V @ 0.4 ms.

## 2020-04-04 LAB — CBC
Hematocrit: 40.6 % (ref 37.5–51.0)
Hemoglobin: 13.6 g/dL (ref 13.0–17.7)
MCH: 31.5 pg (ref 26.6–33.0)
MCHC: 33.5 g/dL (ref 31.5–35.7)
MCV: 94 fL (ref 79–97)
Platelets: 231 10*3/uL (ref 150–450)
RBC: 4.32 x10E6/uL (ref 4.14–5.80)
RDW: 12.7 % (ref 11.6–15.4)
WBC: 8.7 10*3/uL (ref 3.4–10.8)

## 2020-04-04 LAB — MULTIPLE MYELOMA PANEL, SERUM
Albumin SerPl Elph-Mcnc: 3.8 g/dL (ref 2.9–4.4)
Albumin/Glob SerPl: 1 (ref 0.7–1.7)
Alpha 1: 0.2 g/dL (ref 0.0–0.4)
Alpha2 Glob SerPl Elph-Mcnc: 1.1 g/dL — ABNORMAL HIGH (ref 0.4–1.0)
B-Globulin SerPl Elph-Mcnc: 1.3 g/dL (ref 0.7–1.3)
Gamma Glob SerPl Elph-Mcnc: 1.4 g/dL (ref 0.4–1.8)
Globulin, Total: 4 g/dL — ABNORMAL HIGH (ref 2.2–3.9)
IgA/Immunoglobulin A, Serum: 474 mg/dL — ABNORMAL HIGH (ref 61–437)
IgG (Immunoglobin G), Serum: 1409 mg/dL (ref 603–1613)
IgM (Immunoglobulin M), Srm: 32 mg/dL (ref 15–143)

## 2020-04-04 LAB — COMPREHENSIVE METABOLIC PANEL
ALT: 16 IU/L (ref 0–44)
AST: 24 IU/L (ref 0–40)
Albumin/Globulin Ratio: 1.2 (ref 1.2–2.2)
Albumin: 4.3 g/dL (ref 3.5–4.6)
Alkaline Phosphatase: 70 IU/L (ref 48–121)
BUN/Creatinine Ratio: 26 — ABNORMAL HIGH (ref 10–24)
BUN: 24 mg/dL (ref 10–36)
Bilirubin Total: 0.4 mg/dL (ref 0.0–1.2)
CO2: 26 mmol/L (ref 20–29)
Calcium: 9.6 mg/dL (ref 8.6–10.2)
Chloride: 101 mmol/L (ref 96–106)
Creatinine, Ser: 0.94 mg/dL (ref 0.76–1.27)
GFR calc Af Amer: 80 mL/min/{1.73_m2} (ref >59)
GFR calc non Af Amer: 70 mL/min/{1.73_m2} (ref >59)
Globulin, Total: 3.5 g/dL (ref 1.5–4.5)
Glucose: 98 mg/dL (ref 65–99)
Potassium: 5.3 mmol/L — ABNORMAL HIGH (ref 3.5–5.2)
Sodium: 139 mmol/L (ref 134–144)
Total Protein: 7.8 g/dL (ref 6.0–8.5)

## 2020-04-04 LAB — ANTINUCLEAR ANTIBODIES, IFA: ANA Titer 1: NEGATIVE

## 2020-04-04 LAB — HEAVY METALS, BLOOD
Arsenic: 4 ug/L (ref 2–23)
Lead, Blood: 1 ug/dL (ref 0–4)
Mercury: 2.2 ug/L (ref 0.0–14.9)

## 2020-04-04 LAB — GLIADIN ANTIBODIES, SERUM
Antigliadin Abs, IgA: 6 units (ref 0–19)
Gliadin IgG: 2 units (ref 0–19)

## 2020-04-04 LAB — SEDIMENTATION RATE: Sed Rate: 37 mm/hr — ABNORMAL HIGH (ref 0–30)

## 2020-04-04 LAB — B. BURGDORFI ANTIBODIES: Lyme IgG/IgM Ab: <0.91 {ISR} (ref 0.00–0.90)

## 2020-04-04 LAB — HEMOGLOBIN A1C
Est. average glucose Bld gHb Est-mCnc: 123 mg/dL
Hgb A1c MFr Bld: 5.9 % — ABNORMAL HIGH (ref 4.8–5.6)

## 2020-04-04 LAB — TSH: TSH: 1.85 u[IU]/mL (ref 0.450–4.500)

## 2020-04-04 LAB — VITAMIN B1: Thiamine: 137.9 nmol/L (ref 66.5–200.0)

## 2020-04-04 LAB — B12 AND FOLATE PANEL
Folate: 10.9 ng/mL (ref >3.0)
Vitamin B-12: >2000 pg/mL — ABNORMAL HIGH (ref 232–1245)

## 2020-04-04 LAB — TISSUE TRANSGLUTAMINASE, IGA: Transglutaminase IgA: <2 U/mL (ref 0–3)

## 2020-04-04 LAB — RHEUMATOID FACTOR: Rheumatoid fact SerPl-aCnc: <10 IU/mL (ref 0.0–13.9)

## 2020-04-04 LAB — SJOGREN'S SYNDROME ANTIBODS(SSA + SSB)
ENA SSA (RO) Ab: <0.2 AI (ref 0.0–0.9)
ENA SSB (LA) Ab: <0.2 AI (ref 0.0–0.9)

## 2020-04-04 LAB — VITAMIN B6: Vitamin B6: 6.2 ug/L (ref 5.3–46.7)

## 2020-04-04 LAB — METHYLMALONIC ACID, SERUM: Methylmalonic Acid: 136 nmol/L (ref 0–378)

## 2020-04-05 ENCOUNTER — Telehealth: Payer: Self-pay | Admitting: *Deleted

## 2020-04-05 NOTE — Telephone Encounter (Signed)
Spoke with pt and advised he is prediabetic per lab results and suggested he follow up with PCP. Pt aware other labs looked fine and we will see him for EMG/NCS on June 28th @ 1:15 pm. Pt verbalized appreciation and his questions were answered.

## 2020-04-05 NOTE — Telephone Encounter (Signed)
-----   Message from Anson Fret, MD sent at 04/03/2020  2:36 PM EDT ----- Patient is ore-diabetic otherwise labs look fine, will see him for emg/ncs thanks.

## 2020-04-06 ENCOUNTER — Ambulatory Visit (INDEPENDENT_AMBULATORY_CARE_PROVIDER_SITE_OTHER): Payer: Medicare Other | Admitting: Podiatry

## 2020-04-06 ENCOUNTER — Other Ambulatory Visit: Payer: Self-pay

## 2020-04-06 ENCOUNTER — Encounter: Payer: Self-pay | Admitting: Podiatry

## 2020-04-06 VITALS — Temp 97.7°F

## 2020-04-06 DIAGNOSIS — M79675 Pain in left toe(s): Secondary | ICD-10-CM

## 2020-04-06 DIAGNOSIS — B351 Tinea unguium: Secondary | ICD-10-CM | POA: Diagnosis not present

## 2020-04-06 DIAGNOSIS — M79674 Pain in right toe(s): Secondary | ICD-10-CM | POA: Diagnosis not present

## 2020-04-06 NOTE — Progress Notes (Signed)
This patient returns to my office for at risk foot care.  This patient requires this care by a professional since this patient will be at risk due to having peripheral polyneuropathy.  Patient presents to the office in a wheelchair accompanied by his daughter. This patient is unable to cut nails himself since the patient cannot reach his nails.These nails are painful walking and wearing shoes.  This patient presents for at risk foot care today.  General Appearance  Alert, conversant and in no acute stress.  Vascular  Dorsalis pedis and posterior tibial  pulses are palpable  bilaterally.  Capillary return is within normal limits  bilaterally. Temperature is within normal limits  bilaterally.  Neurologic  Senn-Weinstein monofilament wire test within normal limits  bilaterally. Muscle power within normal limits bilaterally.  Nails Thick disfigured discolored nails with subungual debris  from hallux to fifth toes bilaterally. No evidence of bacterial infection or drainage bilaterally.  Orthopedic  No limitations of motion  feet .  No crepitus or effusions noted.  No bony pathology or digital deformities noted.  Skin  normotropic skin with no porokeratosis noted bilaterally.  No signs of infections or ulcers noted.     Onychomycosis  Pain in right toes  Pain in left toes  Consent was obtained for treatment procedures.   Mechanical debridement of nails 1-5  bilaterally performed with a nail nipper.  Filed with dremel without incident.    Return office visit   4 months                  Told patient to return for periodic foot care and evaluation due to potential at risk complications.   Helane Gunther DPM

## 2020-05-02 ENCOUNTER — Ambulatory Visit (INDEPENDENT_AMBULATORY_CARE_PROVIDER_SITE_OTHER): Payer: Medicare Other | Admitting: *Deleted

## 2020-05-02 DIAGNOSIS — I442 Atrioventricular block, complete: Secondary | ICD-10-CM | POA: Diagnosis not present

## 2020-05-03 ENCOUNTER — Telehealth: Payer: Self-pay

## 2020-05-03 LAB — CUP PACEART REMOTE DEVICE CHECK
Battery Remaining Longevity: 84 mo
Battery Remaining Percentage: 100 %
Brady Statistic RA Percent Paced: 3 %
Brady Statistic RV Percent Paced: 99 %
Date Time Interrogation Session: 20210624100500
Implantable Lead Implant Date: 20090901
Implantable Lead Implant Date: 20090901
Implantable Lead Location: 753859
Implantable Lead Location: 753860
Implantable Lead Model: 4076
Implantable Lead Model: 4076
Implantable Pulse Generator Implant Date: 20180723
Lead Channel Impedance Value: 322 Ohm
Lead Channel Impedance Value: 949 Ohm
Lead Channel Pacing Threshold Amplitude: 0.9 V
Lead Channel Pacing Threshold Pulse Width: 0.4 ms
Lead Channel Setting Pacing Amplitude: 2 V
Lead Channel Setting Pacing Amplitude: 3.5 V
Lead Channel Setting Pacing Pulse Width: 0.4 ms
Lead Channel Setting Sensing Sensitivity: 2.5 mV
Pulse Gen Serial Number: 357327

## 2020-05-03 NOTE — Progress Notes (Signed)
Remote pacemaker transmission.   

## 2020-05-03 NOTE — Telephone Encounter (Signed)
Spoke with patient to remind of missed remote transmission 

## 2020-05-07 ENCOUNTER — Encounter: Payer: Medicare Other | Admitting: Neurology

## 2020-05-09 ENCOUNTER — Institutional Professional Consult (permissible substitution): Payer: Medicare Other | Admitting: Critical Care Medicine

## 2020-05-16 ENCOUNTER — Other Ambulatory Visit: Payer: Self-pay

## 2020-05-16 ENCOUNTER — Ambulatory Visit (INDEPENDENT_AMBULATORY_CARE_PROVIDER_SITE_OTHER): Payer: Medicare Other | Admitting: Critical Care Medicine

## 2020-05-16 ENCOUNTER — Encounter: Payer: Self-pay | Admitting: Critical Care Medicine

## 2020-05-16 VITALS — BP 150/78 | HR 65 | Ht 66.14 in | Wt 144.2 lb

## 2020-05-16 DIAGNOSIS — J300 Vasomotor rhinitis: Secondary | ICD-10-CM | POA: Diagnosis not present

## 2020-05-16 DIAGNOSIS — R9389 Abnormal findings on diagnostic imaging of other specified body structures: Secondary | ICD-10-CM | POA: Diagnosis not present

## 2020-05-16 DIAGNOSIS — R0600 Dyspnea, unspecified: Secondary | ICD-10-CM

## 2020-05-16 DIAGNOSIS — R0609 Other forms of dyspnea: Secondary | ICD-10-CM

## 2020-05-16 DIAGNOSIS — R5381 Other malaise: Secondary | ICD-10-CM

## 2020-05-16 NOTE — Patient Instructions (Addendum)
Thank you for visiting Dr. Chestine Spore at [facility]. We recommend the following: Orders Placed This Encounter  Procedures  . CT Chest High Resolution  . Pulmonary function test   Orders Placed This Encounter  Procedures  . CT Chest High Resolution    Standing Status:   Future    Standing Expiration Date:   05/16/2021    Order Specific Question:   ** REASON FOR EXAM (FREE TEXT)    Answer:   concern for ILD    Order Specific Question:   Preferred imaging location?    Answer:   [facility]    Order Specific Question:   Radiology Contrast Protocol - do NOT remove file path    Answer:   [email or web address]  . Pulmonary function test    Standing Status:   Future    Standing Expiration Date:   05/16/2021    Order Specific Question:   Where should this test be performed?    Answer:   [facility]    Order Specific Question:   Full PFT: includes the following: basic spirometry, spirometry pre & post bronchodilator, diffusion capacity (DLCO), lung volumes    Answer:   Full PFT      Return in about 2 months (around 07/17/2020). after PFTs.    Please do your part to reduce the spread of COVID-19.

## 2020-05-16 NOTE — Progress Notes (Signed)
Synopsis: Referred in July 2021 for DOE by Johny Blamer, MD.  Subjective:   PATIENT ID: Justin Richmond GENDER: male DOB: 01/14/1924, MRN: [id number]  Chief Complaint  Patient presents with   Consult    Patient has shortness of breath with exertion that he has had for some time but feels like it has got worse but also became more mobile than before. Dry cough. Patient walks from room to room at home with a walker.     Justin Richmond is a 86 year old gentleman with a history of complete heart block status post PPM in 2009, lower extremity neuropathy bilaterally, lumbar spinal stenosis who presents for evaluation of dyspnea on exertion.  He is accompanied by daughter who lives in [place] and is staying with him while his daughter who lives in [place] is on vacation with her family.  He has dyspnea on exertion that has been going on for several years. He can only walk about 10 steps before stopping due to shortness of breath.  It did not have a discrete onset.  He is noticing it more since he is trying to walk more with his walker.  Due to balance issues and fear of falling from neuropathy and lower extremity numbness he has used a wheelchair or scooter significantly for the past several years.  He has been hospitalized twice for pneumonia in the last 5 years, but otherwise has no previous history of lung disease.  His father died of black lung disease from coal mining in his 68s and he has a son with asthma since childhood.  He never had shortness of breath at rest, sputum production, or significant cough.  He has postnasal drip and rhinorrhea which causes him to clear his throat frequently.  He has infrequent wheezing.  He does not remember when his mobility was last normal.  He moved to [place] from [place] in 2020 several months after his wife passed away.  He has a history of an enlarged heart, but is unsure if he has a history of congestive heart disease.  He follows with Dr. Graciela Husbands in  electrophysiology.  His most recent pacemaker generator change was in 2018.  Dr. Odessa Fleming note on 08/03/2019 indicates that his shortness of breath is not well explained by his cardiac disease.  He mentions a previously abnormal CT scan, but the patient is unsure if he has ever had one.  There is a chest x-ray report from [address] in [place] scanned into the chart.  He has no previous history of MI or PCI, but had a prescription for nitroglycerin several years ago which he infrequently had to use.  He has chronic right lower extremity edema, but is not on a diuretic regimen.  He does not relate shortness of breath to times when his pacemaker was implanted or changes were made.  He is pacemaker dependent.  Neuro appt, Dr. 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  No previous amiodarone exposure.  No pets; he has never had a pet bird.  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  No other occupational dust exposures.  He quit smoking in 1951 after 4 years x 0.75 packs/day.     Past Medical History:  Diagnosis Date   Arrhythmia    Balance problems    Chronic back pain  Chronic constipation    Congestive heart failure (HCC)    Diarrhea    Eczema    Eye disease    cataracts/glaucoma   GERD (gastroesophageal reflux disease)    Hearing loss    Hemorrhoids    High cholesterol    Hypertension    Irregular heartbeat    Joint pain    Joint stiffness    Migraine headache    Mouth sores    Muscle weakness    OAB (overactive bladder)    Osteoarthritis    Osteomyelitis (HCC)    childhood, left leg   Pacemaker    Poor circulation    Pure hypercholesterolemia    Rectal bleeding    Recurrent UTI    Scoliosis    Shortness of breath    Skin disorder    Spinal stenosis,  lumbar region with neurogenic claudication      Family History  Problem Relation Age of Onset   Stroke Mother    Other Father        "black lung disease"   Cancer Sister    Ankylosing spondylitis Brother    Asthma Son      Past Surgical History:  Procedure Laterality Date   DENTAL SURGERY     tooth removal, root canals   INSERT / REPLACE / REMOVE PACEMAKER     osteomyelitis surgery     x10 surgeries   SPINAL INJECTION     x3     Social History   Socioeconomic History   Marital status: Widowed    Spouse name: Not on file   Number of children: 4   Years of education: Not on file   Highest education level: Professional school degree (e.g., MD, DDS, DVM, JD)  Occupational History   Not on file  Tobacco Use   Smoking status: Former Smoker    Packs/day: 0.25    Years: 2.00    Pack years: 0.50    Types: Cigarettes    Quit date: 05/16/1950    Years since quitting: 70.0   Smokeless tobacco: Never Used  Vaping Use   Vaping Use: Never used  Substance and Sexual Activity   Alcohol use: Never    Comment: in college   Drug use: Never    Comment: no   Sexual activity: Not Currently  Other Topics Concern   Not on file  Social History Narrative   Lives with daughter   Right handed   Caffeine: 1 cup/day   Social Determinants of Health   Financial Resource Strain:    Difficulty of Paying Living Expenses:   Food Insecurity:    Worried About Programme researcher, broadcasting/film/video in the Last Year:    Barista in the Last Year:   Transportation Needs:    Freight forwarder (Medical):    Lack of Transportation (Non-Medical):   Physical Activity:    Days of Exercise per Week:    Minutes of Exercise per Session:   Stress:    Feeling of Stress :   Social Connections:    Frequency of Communication with Friends and Family:    Frequency of Social Gatherings with Friends and Family:    Attends Religious Services:    Active Member of Clubs or  Organizations:    Attends Banker Meetings:    Marital Status:   Intimate Partner Violence:    Fear of Current or Ex-Partner:    Emotionally Abused:    Physically Abused:  Sexually Abused:      Allergies  Allergen Reactions   Penicillins     Unknown reaction, "it's been a long time".      There is no immunization history on file for this patient.  Outpatient Medications Prior to Visit  Medication Sig Dispense Refill   aspirin EC 81 MG tablet Take 81 mg by mouth daily.     carvedilol (COREG) 6.25 MG tablet Take 6.25 mg by mouth 2 (two) times daily with a meal.     famotidine (PEPCID) 40 MG tablet Take 1 tablet by mouth daily.     rosuvastatin (CRESTOR) 10 MG tablet      pantoprazole (PROTONIX) 40 MG tablet Take 40 mg by mouth daily.     No facility-administered medications prior to visit.    Review of Systems  Constitutional: Negative for chills and fever.  HENT: Negative for congestion.        Rhinorrhea and postnasal drip chronically  Respiratory: Positive for shortness of breath and wheezing. Negative for cough and sputum production.   Cardiovascular: Positive for leg swelling. Negative for chest pain.  Gastrointestinal: Negative.   Genitourinary: Negative.   Musculoskeletal: Negative for joint pain and myalgias.  Skin: Negative for rash.  Neurological: Positive for weakness.       Chronic numbness bilateral lower extremities and balance issues due to neuropathy  Endo/Heme/Allergies: Does not bruise/bleed easily.     Objective:   Vitals:   05/16/20 1143  BP: (!) 150/78  Pulse: 65  SpO2: 95%  Weight: 144 lb 3.2 oz (65.4 kg)  Height: 5' 6.14" (1.68 m)   95% on  RA BMI Readings from Last 3 Encounters:  05/16/20 23.17 kg/m  03/29/20 22.40 kg/m  08/03/19 20.89 kg/m   Wt Readings from Last 3 Encounters:  05/16/20 144 lb 3.2 oz (65.4 kg)  03/29/20 143 lb (64.9 kg)  08/03/19 133 lb 6.4 oz (60.5 kg)    Physical Exam Vitals  reviewed.  Constitutional:      General: He is not in acute distress.    Comments: Frail-appearing elderly man sitting in a wheelchair  HENT:     Head: Normocephalic and atraumatic.  Eyes:     General: No scleral icterus. Cardiovascular:     Rate and Rhythm: Normal rate and regular rhythm.     Heart sounds: No murmur heard.   Pulmonary:     Comments: Breathing comfortably on room air, speaking in full sentences without conversational dyspnea.  Clear to auscultation bilaterally. Abdominal:     General: There is no distension.     Palpations: Abdomen is soft.  Musculoskeletal:     Cervical back: Neck supple.     Comments: Right lower extremity pitting edema, reduced left lower extremity muscle mass chronically  Lymphadenopathy:     Cervical: No cervical adenopathy.  Skin:    General: Skin is warm and dry.     Findings: No rash.  Neurological:     General: No focal deficit present.     Mental Status: He is alert.     Coordination: Coordination normal.  Psychiatric:        Mood and Affect: Mood normal.        Behavior: Behavior normal.      CBC    Component Value Date/Time   WBC 8.7 03/29/2020 1419   RBC 4.32 03/29/2020 1419   HGB 13.6 03/29/2020 1419   HCT 40.6 03/29/2020 1419   PLT 231 03/29/2020 1419   MCV 94  03/29/2020 1419   MCH 31.5 03/29/2020 1419   MCHC 33.5 03/29/2020 1419   RDW 12.7 03/29/2020 1419    CHEMISTRY No results for input(s): NA, K, CL, CO2, GLUCOSE, BUN, CREATININE, CALCIUM, MG, PHOS in the last 168 hours. CrCl cannot be calculated (Patient's most recent lab result is older than the maximum 21 days allowed.).  PCP labs 06/27/19: BUN 23 Cr 0.89  Chest Imaging- films reviewed: Report only available from 2016 CXR-biapical scarring, increased interstitial markings bilaterally, but improved compared to a prior film  Pulmonary Functions Testing Results: No flowsheet data found.   Echocardiogram 06/22/2018: LVEF 73%, diastolic dysfunction  present.  Normal LA, RV, RA.  Mild AR.     Assessment & Plan:     ICD-10-CM   1. DOE (dyspnea on exertion)  R06.00 Pulmonary function test    CT Chest High Resolution  2. Physical deconditioning  R53.81 Pulmonary function test  3. Abnormal CXR  R93.89 Pulmonary function test  4. 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  Deconditioning seems to be a significant part of this, although interstitial lung disease cannot be ruled out with a history of abnormal chest x-ray.  He does not have obvious inhalant exposures that increases risk for ILD. -HRCT chest -PFTs -Discussed deconditioning, which is best addressed with frequent submaximal physical activity.  Shortness of breath does not have to be a limitation; chest pain, dizziness, lightheadedness, feeling like you are going to fall or pass out would be symptoms that require you to stop rather than dyspnea alone. -If HRCT chest and PFTs do not fully explain symptoms, can assess whether he is limited by cardiac output from pacemaker settings, although this seems less likely than deconditioning.  Vasomotor rhinitis -Can start azelastine nasal spray twice daily if this remains bothersome.  Deconditioning, likely due to immobility from peripheral neuropathy -Regular submaximal physical activity -Physical therapy would be an option; neurology can better prognosticate if this will be progressive or not.   RTC in 2 months after PFTs.   Current Outpatient Medications:    aspirin EC 81 MG tablet, Take 81 mg by mouth daily., Disp: , Rfl:    carvedilol (COREG) 6.25 MG tablet, Take 6.25 mg by mouth 2 (two) times daily with a meal., Disp: , Rfl:    famotidine (PEPCID) 40 MG tablet, Take 1 tablet by mouth daily., Disp: , Rfl:    rosuvastatin (CRESTOR) 10 MG tablet, , Disp: , Rfl:    I spent 60 minutes on this encounter, including face to face time and non-face to face time spent reviewing records, charting,  coordinating care, etc.   Steffanie DunnLaura P Kinzlee Selvy, DO [facility] 05/16/2020 1:23 PM

## 2020-05-21 ENCOUNTER — Telehealth: Payer: Self-pay | Admitting: Critical Care Medicine

## 2020-05-21 NOTE — Telephone Encounter (Signed)
ATC Patient's Daughter [name].  LM to call back.

## 2020-05-22 NOTE — Telephone Encounter (Signed)
Spoke with pt's daughter, Marisue Ivan. She had a few questions about the timing of her father's follow up. These questions have been answered to the best of my ability. She also asked to have the pt's OV and PFT rescheduled to a different date. This has been done as well as his COVID test. Nothing further was needed.

## 2020-05-22 NOTE — Telephone Encounter (Signed)
Pt's daughter Norva Pavlov returning call.  [phone].  Should be available the rest of the day.  Usually late morning, early afternoon.

## 2020-05-22 NOTE — Telephone Encounter (Signed)
LMTCB x2 for pt's daughter, Marisue Ivan.

## 2020-05-22 NOTE — Telephone Encounter (Signed)
Called and left message for daughter, Norva Pavlov, to return call.

## 2020-07-02 ENCOUNTER — Ambulatory Visit (INDEPENDENT_AMBULATORY_CARE_PROVIDER_SITE_OTHER): Payer: Medicare Other | Admitting: Neurology

## 2020-07-02 DIAGNOSIS — G629 Polyneuropathy, unspecified: Secondary | ICD-10-CM | POA: Diagnosis not present

## 2020-07-02 DIAGNOSIS — Z0289 Encounter for other administrative examinations: Secondary | ICD-10-CM

## 2020-07-02 NOTE — Progress Notes (Signed)
Full Name: Justin Richmond Gender: Male MRN #: [id number] Date of Birth: 03/15/1924    Visit Date: 07/02/2020 14:50 Age: 86 Years Examining Physician: Naomie Dean, MD  Referring Physician: Johny Blamer, MD, Sheran Luz MD Height: 5 feet 6 inch  History: Numbness in the feet  Summary: EMG/nerve conduction study was performed on the right lower extremity.  Peroneal motor nerve showed reduced amplitude (0.6 mV, normal greater than 2) and decreased conduction velocity (fibular head to ankle, 36 m/s, normal greater than 44) and decreased conduction velocity (pop fossa to fibular head, 34 m/s, normal greater than 44).  The right tibial motor nerve showed reduced amplitude (1 mV, normal greater than 4) and decreased conduction velocity (pop fossa to ankle, 35 m/s, normal greater than 41).  The right sural sensory nerve and the right superficial peroneal sensory nerve showed no response.  The right tibial F wave showed delayed latency (56.2 ms, normal less than 56) in the right ulnar F wave showed delayed latency (36.7 ms, normal less than 32).  All remaining nerves (as indicated in the following tables) were within normal limits.  All muscles (as indicated in the following tables) were within normal limits.       Conclusion: There is electrophysiologic evidence of the length dependent, axonal, sensorimotor, moderately-severe peripheral polyneuropathy.  Naomie Dean, M.D.  [facility] [address] [place], [place] [address] Tel: [phone] Fax: [phone]  Verbal informed consent was obtained from the patient, patient was informed of potential risk of procedure, including bruising, bleeding, hematoma formation, infection, muscle weakness, muscle pain, numbness, among others.         MNC    Nerve / Sites Muscle Latency Ref. Amplitude Ref. Rel Amp Segments Distance Velocity Ref. Area    ms ms mV mV %  cm m/s m/s mVms  R Ulnar - ADM     Wrist ADM 3.3 ?3.3  7.1 ?6.0 100 Wrist - ADM 7   33.2     B.Elbow ADM 7.9  6.8  95.6 B.Elbow - Wrist 23 49 ?49 29.4     A.Elbow ADM 10.0  6.6  96.3 A.Elbow - B.Elbow 10 49 ?49 29.5         A.Elbow - Wrist      R Peroneal - EDB     Ankle EDB 6.1 ?6.5 0.6 ?2.0 100 Ankle - EDB 9   4.0     Fib head EDB 14.3  0.5  72.7 Fib head - Ankle 29 36 ?44 3.3     Pop fossa EDB 17.2  0.5  104 Pop fossa - Fib head 10 34 ?44 3.0         Pop fossa - Ankle      R Tibial - AH     Ankle AH 4.6 ?5.8 1.0 ?4.0 100 Ankle - AH 9   6.2     Pop fossa AH 16.6  0.7  68.5 Pop fossa - Ankle 42 35 ?41 3.5           SNC    Nerve / Sites Rec. Site Peak Lat Ref.  Amp Ref. Segments Distance    ms ms V V  cm  R Sural - Ankle (Calf)     Calf Ankle NR ?4.4 NR ?6 Calf - Ankle 14  R Superficial peroneal - Ankle     Lat leg Ankle NR ?4.4 NR ?6 Lat leg - Ankle 14  R Ulnar - Orthodromic, (Dig  V, Mid palm)     Dig V Wrist 3.1 ?3.1 5 ?5 Dig V - Wrist 11           F  Wave    Nerve F Lat Ref.   ms ms  R Tibial - AH 56.2 ?56.0  R Ulnar - ADM 36.7 ?32.0         EMG Summary Table    Spontaneous MUAP Recruitment  Muscle IA Fib PSW Fasc Other Amp Dur. Poly Pattern  R. Vastus medialis Normal None None None _______ Normal Normal Normal Normal  R. Tibialis anterior Normal None None None _______ Normal Normal Normal Normal  R. Gastrocnemius (Medial head) Normal None None None _______ Normal Normal Normal Normal  R. Extensor hallucis longus Normal None None None _______ Normal Normal Normal Normal  R. Abductor hallucis Normal None None None _______ Normal Normal Normal Normal      

## 2020-07-02 NOTE — Progress Notes (Signed)
See procedure note.

## 2020-07-03 NOTE — Progress Notes (Signed)
History: This is a really lovely 86 year old here as requested by Dr. Herma Mering and Dr. Kenton Kingfisher for peripheral polyneuropathy in the legs.  He is a very nice gentleman here with his wife who wants to be very proactive about trying to find out why he has chronic numbness for decades in his feet and legs.  EMG nerve conduction results consistent with axonal peripheral polyneuropathy.  I did discuss today again with patient and daughter that after many years it is unlikely that numbness will improve with any treatment or that we may even find a cause.  However patient really wanted to go forward with work-up and we did perform extensive lab work which included ANA, MMA, multiple myeloma, B6, heavy metals, rheumatoid factor, celiac's disease antibodies, B12, folate, Sjogren's, sed rate, TSH, B1, Lyme, hemoglobin A1c without any significant findings.  His hemoglobin A1c was 5.9 which is prediabetes which may be contributing.  EMG nerve conduction study today did show a suspected axonal, length dependent, peripheral polyneuropathy of idiopathic etiology.  I reviewed this with patient and his daughter today, we reviewed lab work, and unfortunately at this time there is no intervention for his chronic polyneuropathy.  EMG did not show any myopathy or myositis or other issues to explain his reported weakness however is likely due to his history of lumbar stenosis.  Patient and daughter appreciated discussion.     I spent over 15 minutes of face-to-face and non-face-to-face time with patient on the  1. Peripheral polyneuropathy    diagnosis.  This included previsit chart review, lab review, study review, order entry, electronic health record documentation, patient education on the different diagnostic and therapeutic options, counseling and coordination of care, risks and benefits of management, compliance, or risk factor reduction. This does not include time spent on emg/ncs.

## 2020-07-03 NOTE — Procedures (Signed)
Full Name: Justin Richmond Gender: Male MRN #: [id number] Date of Birth: 03/15/1924    Visit Date: 07/02/2020 14:50 Age: 86 Years Examining Physician: Naomie Dean, MD  Referring Physician: Johny Blamer, MD, Sheran Luz MD Height: 5 feet 6 inch  History: Numbness in the feet  Summary: EMG/nerve conduction study was performed on the right lower extremity.  Peroneal motor nerve showed reduced amplitude (0.6 mV, normal greater than 2) and decreased conduction velocity (fibular head to ankle, 36 m/s, normal greater than 44) and decreased conduction velocity (pop fossa to fibular head, 34 m/s, normal greater than 44).  The right tibial motor nerve showed reduced amplitude (1 mV, normal greater than 4) and decreased conduction velocity (pop fossa to ankle, 35 m/s, normal greater than 41).  The right sural sensory nerve and the right superficial peroneal sensory nerve showed no response.  The right tibial F wave showed delayed latency (56.2 ms, normal less than 56) in the right ulnar F wave showed delayed latency (36.7 ms, normal less than 32).  All remaining nerves (as indicated in the following tables) were within normal limits.  All muscles (as indicated in the following tables) were within normal limits.       Conclusion: There is electrophysiologic evidence of the length dependent, axonal, sensorimotor, moderately-severe peripheral polyneuropathy.  Naomie Dean, M.D.  [facility] [address] [place], [place] [address] Tel: [phone] Fax: [phone]  Verbal informed consent was obtained from the patient, patient was informed of potential risk of procedure, including bruising, bleeding, hematoma formation, infection, muscle weakness, muscle pain, numbness, among others.         MNC    Nerve / Sites Muscle Latency Ref. Amplitude Ref. Rel Amp Segments Distance Velocity Ref. Area    ms ms mV mV %  cm m/s m/s mVms  R Ulnar - ADM     Wrist ADM 3.3 ?3.3  7.1 ?6.0 100 Wrist - ADM 7   33.2     B.Elbow ADM 7.9  6.8  95.6 B.Elbow - Wrist 23 49 ?49 29.4     A.Elbow ADM 10.0  6.6  96.3 A.Elbow - B.Elbow 10 49 ?49 29.5         A.Elbow - Wrist      R Peroneal - EDB     Ankle EDB 6.1 ?6.5 0.6 ?2.0 100 Ankle - EDB 9   4.0     Fib head EDB 14.3  0.5  72.7 Fib head - Ankle 29 36 ?44 3.3     Pop fossa EDB 17.2  0.5  104 Pop fossa - Fib head 10 34 ?44 3.0         Pop fossa - Ankle      R Tibial - AH     Ankle AH 4.6 ?5.8 1.0 ?4.0 100 Ankle - AH 9   6.2     Pop fossa AH 16.6  0.7  68.5 Pop fossa - Ankle 42 35 ?41 3.5           SNC    Nerve / Sites Rec. Site Peak Lat Ref.  Amp Ref. Segments Distance    ms ms V V  cm  R Sural - Ankle (Calf)     Calf Ankle NR ?4.4 NR ?6 Calf - Ankle 14  R Superficial peroneal - Ankle     Lat leg Ankle NR ?4.4 NR ?6 Lat leg - Ankle 14  R Ulnar - Orthodromic, (Dig  V, Mid palm)     Dig V Wrist 3.1 ?3.1 5 ?5 Dig V - Wrist 33           F  Wave    Nerve F Lat Ref.   ms ms  R Tibial - AH 56.2 ?56.0  R Ulnar - ADM 36.7 ?32.0         EMG Summary Table    Spontaneous MUAP Recruitment  Muscle IA Fib PSW Fasc Other Amp Dur. Poly Pattern  R. Vastus medialis Normal None None None _______ Normal Normal Normal Normal  R. Tibialis anterior Normal None None None _______ Normal Normal Normal Normal  R. Gastrocnemius (Medial head) Normal None None None _______ Normal Normal Normal Normal  R. Extensor hallucis longus Normal None None None _______ Normal Normal Normal Normal  R. Abductor hallucis Normal None None None _______ Normal Normal Normal Normal

## 2020-07-04 ENCOUNTER — Telehealth: Payer: Self-pay | Admitting: Neurology

## 2020-07-04 NOTE — Telephone Encounter (Signed)
I called the pt. He stated the bill did not specify which labs were covered and which ones were not. He has a bill of $500.

## 2020-07-04 NOTE — Telephone Encounter (Signed)
Pt called stating that he received a bill from [facility] and they informed him that in order for him not to have this bill the codes would have to be changed for his lab work. Please advise.

## 2020-07-05 NOTE — Telephone Encounter (Signed)
I called [facility] and updated the codes for the labs she provided: Vitamin B12, Hgb A1C, Sjogrens, and Gliadin. Dr. Lucia Gaskins was checking for Diabetes, Vitamin B12 deficiency, Celiac Disease and Sjogren's. They will bill insurance again and I was advised the pt should disregard the current bill and wait 30-45 days for them to hear back from insurance. I updated the pt. He verbalized appreciation.

## 2020-07-14 ENCOUNTER — Other Ambulatory Visit (HOSPITAL_COMMUNITY): Payer: Medicare Other

## 2020-07-18 ENCOUNTER — Ambulatory Visit: Payer: Medicare Other | Admitting: Critical Care Medicine

## 2020-07-20 ENCOUNTER — Other Ambulatory Visit: Payer: Self-pay

## 2020-07-20 ENCOUNTER — Ambulatory Visit
Admission: RE | Admit: 2020-07-20 | Discharge: 2020-07-20 | Disposition: A | Discharge status: Home / Self Care | Payer: Medicare Other | Source: Ambulatory Visit | Attending: Critical Care Medicine | Admitting: Critical Care Medicine

## 2020-07-20 DIAGNOSIS — R0600 Dyspnea, unspecified: Secondary | ICD-10-CM

## 2020-07-20 DIAGNOSIS — R0609 Other forms of dyspnea: Secondary | ICD-10-CM

## 2020-07-20 NOTE — Progress Notes (Signed)
Please let Mr. Justin Richmond know that his CT scan does show some scarring in his lungs.  We will review this at his visit coming up on 9/17.  Steffanie Dunn, DO 07/20/20 8:06 PM [facility]

## 2020-07-23 ENCOUNTER — Telehealth: Payer: Self-pay | Admitting: Critical Care Medicine

## 2020-07-23 NOTE — Progress Notes (Signed)
Tried calling pt, NA and no VM

## 2020-07-23 NOTE — Telephone Encounter (Signed)
Tried calling the pt and he did not answer and there was no VM, WCB

## 2020-07-23 NOTE — Telephone Encounter (Signed)
Please let Mr. Justin Richmond know that his CT scan does show some scarring in his lungs.  We will review this at his visit coming up on 9/17.  Laura P Clark, DO 07/20/20 8:06 PM [facility]  

## 2020-07-24 ENCOUNTER — Other Ambulatory Visit (HOSPITAL_COMMUNITY)
Admission: RE | Admit: 2020-07-24 | Discharge: 2020-07-24 | Disposition: A | Discharge status: Home / Self Care | Payer: Medicare Other | Source: Ambulatory Visit | Attending: Critical Care Medicine | Admitting: Critical Care Medicine

## 2020-07-24 ENCOUNTER — Other Ambulatory Visit (HOSPITAL_COMMUNITY): Payer: Medicare Other

## 2020-07-24 DIAGNOSIS — Z01812 Encounter for preprocedural laboratory examination: Secondary | ICD-10-CM | POA: Diagnosis present

## 2020-07-24 DIAGNOSIS — Z20822 Contact with and (suspected) exposure to covid-19: Secondary | ICD-10-CM | POA: Insufficient documentation

## 2020-07-24 LAB — SARS CORONAVIRUS 2 (TAT 6-24 HRS): SARS Coronavirus 2: NEGATIVE

## 2020-07-24 NOTE — Telephone Encounter (Signed)
Called spoke with patient let him know the results. And he wanted to let us know he went for his covid test today for his procedure on the 17th  Nothing further needed at this time.

## 2020-07-26 NOTE — Progress Notes (Signed)
Synopsis: Referred in July 2021 for DOE by Shirline Frees, MD.  Subjective:   PATIENT ID: Justin Richmond GENDER: male DOB: [birth year], MRN: [id number]  Chief Complaint  Patient presents with  . Follow-up    PFT today. Still shortness of breath with exertion. Daughter states that she has heard him breathing a little hard when he is sitting at rest.     Justin Richmond is a 86 y/o gentleman who presents for follow-up of chronic shortness of breath that has been going on for several years.  He has a history of abnormal chest x-ray in [place], but no imaging prior to recent CT scan in the [place] area.  His symptoms are overall stable.  He continues to have shortness of breath with exertion.  He has been more tired with increased shortness of breath at times, even at rest.  Has episodes of shortness of breath that can persist for several hours until he falls asleep. He has a history of asbestos exposures in the remote past, some that were significant volumes, although these were infrequent.  He is accompanied today by his daughter who lives in [place].    OV 05/16/20: Justin Richmond is a 86 year old gentleman with a history of complete heart block status post PPM in 2009, lower extremity neuropathy bilaterally, lumbar spinal stenosis who presents for evaluation of dyspnea on exertion.  He is accompanied by daughter who lives in [place] and is staying with him while his daughter who lives in [place] is on vacation with her family.  He has dyspnea on exertion that has been going on for several years. He can only walk about 10 steps before stopping due to shortness of breath.  It did not have a discrete onset.  He is noticing it more since he is trying to walk more with his walker.  Due to balance issues and fear of falling from neuropathy and lower extremity numbness he has used a wheelchair or scooter significantly for the past several years.  He has been hospitalized twice for pneumonia in the  last 5 years, but otherwise has no previous history of lung disease.  His father died of black lung disease from coal mining in his 60s and he has a son with asthma since childhood.  He never had shortness of breath at rest, sputum production, or significant cough.  He has postnasal drip and rhinorrhea which causes him to clear his throat frequently.  He has infrequent wheezing.  He does not remember when his mobility was last normal.  He moved to [place] from [place] in 2020 several months after his wife passed away.  He has a history of an enlarged heart, but is unsure if he has a history of congestive heart disease.  He follows with Dr. Caryl Comes in electrophysiology.  His most recent pacemaker generator change was in 2018.  Dr. Olin Pia note on 08/03/2019 indicates that his shortness of breath is not well explained by his cardiac disease.  He mentions a previously abnormal CT scan, but the patient is unsure if he has ever had one.  There is a chest x-ray report from thousand 16 in [place] scanned into the chart.  He has no previous history of MI or PCI, but had a prescription for nitroglycerin several years ago which he infrequently had to use.  He has chronic right lower extremity edema, but is not on a diuretic regimen.  He does not relate shortness of breath to times when his pacemaker  was implanted or changes were made.  He is pacemaker dependent.  Neuro appt, Dr. 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  No previous amiodarone exposure.  No pets; he has never had a pet bird.  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  No other occupational dust exposures.  He quit smoking in 1951 after 4 years x 0.75 packs/day.   Past Medical History:  Diagnosis Date  . Arrhythmia   . Balance problems   . Chronic  back pain   . Chronic constipation   . Congestive heart failure ([place])   . Diarrhea   . Eczema   . Eye disease    cataracts/glaucoma  . GERD (gastroesophageal reflux disease)   . Hearing loss   . Hemorrhoids   . High cholesterol   . Hypertension   . Irregular heartbeat   . Joint pain   . Joint stiffness   . Migraine headache   . Mouth sores   . Muscle weakness   . OAB (overactive bladder)   . Osteoarthritis   . Osteomyelitis ([place])    childhood, left leg  . Pacemaker   . Poor circulation   . Pure hypercholesterolemia   . Rectal bleeding   . Recurrent UTI   . Scoliosis   . Shortness of breath   . Skin disorder   . Spinal stenosis, lumbar region with neurogenic claudication      Family History  Problem Relation Age of Onset  . Stroke Mother   . Other Father        "black lung disease"  . Cancer Sister   . Ankylosing spondylitis Brother   . Asthma Son      Past Surgical History:  Procedure Laterality Date  . DENTAL SURGERY     tooth removal, root canals  . INSERT / REPLACE / REMOVE PACEMAKER    . osteomyelitis surgery     x10 surgeries  . SPINAL INJECTION     x3     Social History   Socioeconomic History  . Marital status: Widowed    Spouse name: Not on file  . Number of children: 4  . Years of education: Not on file  . Highest education level: Professional school degree (e.g., MD, DDS, DVM, JD)  Occupational History  . Not on file  Tobacco Use  . Smoking status: Former Smoker    Packs/day: 0.25    Years: 2.00    Pack years: 0.50    Types: Cigarettes    Quit date: 05/16/1950    Years since quitting: 70.2  . Smokeless tobacco: Never Used  Vaping Use  . Vaping Use: Never used  Substance and Sexual Activity  . Alcohol use: Never    Comment: in college  . Drug use: Never    Comment: no  . Sexual activity: Not Currently  Other Topics Concern  . Not on file  Social History Narrative   Lives with daughter   Right handed   Caffeine: 1 cup/day    Social Determinants of Health   Financial Resource Strain:   . Difficulty of Paying Living Expenses: Not on file  Food Insecurity:   . Worried About Charity fundraiser in the Last Year: Not on file  . Ran Out of Food in the Last Year: Not on file  Transportation  Needs:   . Lack of Transportation (Medical): Not on file  . Lack of Transportation (Non-Medical): Not on file  Physical Activity:   . Days of Exercise per Week: Not on file  . Minutes of Exercise per Session: Not on file  Stress:   . Feeling of Stress : Not on file  Social Connections:   . Frequency of Communication with Friends and Family: Not on file  . Frequency of Social Gatherings with Friends and Family: Not on file  . Attends Religious Services: Not on file  . Active Member of Clubs or Organizations: Not on file  . Attends Archivist Meetings: Not on file  . Marital Status: Not on file  Intimate Partner Violence:   . Fear of Current or Ex-Partner: Not on file  . Emotionally Abused: Not on file  . Physically Abused: Not on file  . Sexually Abused: Not on file     Allergies  Allergen Reactions  . Penicillins     Unknown reaction, "it's been a long time".      There is no immunization history on file for this patient.  Outpatient Medications Prior to Visit  Medication Sig Dispense Refill  . aspirin EC 81 MG tablet Take 81 mg by mouth daily.    . carvedilol (COREG) 6.25 MG tablet Take 6.25 mg by mouth 2 (two) times daily with a meal.    . esomeprazole (NEXIUM) 40 MG capsule Take 20 mg by mouth daily.    . rosuvastatin (CRESTOR) 10 MG tablet     . VENTOLIN HFA 108 (90 Base) MCG/ACT inhaler     . famotidine (PEPCID) 40 MG tablet Take 1 tablet by mouth daily.     No facility-administered medications prior to visit.    Review of Systems  Constitutional: Negative for chills and fever.  HENT: Negative for congestion.        Rhinorrhea and postnasal drip chronically  Respiratory: Positive for  shortness of breath and wheezing. Negative for cough and sputum production.   Cardiovascular: Positive for leg swelling. Negative for chest pain.  Gastrointestinal: Negative.   Genitourinary: Negative.   Musculoskeletal: Negative for joint pain and myalgias.  Skin: Negative for rash.  Neurological: Positive for weakness.       Chronic numbness bilateral lower extremities and balance issues due to neuropathy  Endo/Heme/Allergies: Does not bruise/bleed easily.     Objective:   Vitals:   07/27/20 1616  BP: (!) 150/78  Pulse: 77  Temp: (!) 96 F (35.6 C)  TempSrc: Temporal  SpO2: 96%  Weight: 142 lb (64.4 kg)  Height: '5\' 6"'  (1.676 m)   96% on  RA BMI Readings from Last 3 Encounters:  07/27/20 22.92 kg/m  05/16/20 23.17 kg/m  03/29/20 22.40 kg/m   Wt Readings from Last 3 Encounters:  07/27/20 142 lb (64.4 kg)  05/16/20 144 lb 3.2 oz (65.4 kg)  03/29/20 143 lb (64.9 kg)    Physical Exam Vitals reviewed.  Constitutional:      Appearance: He is not ill-appearing.     Comments: Frail-appearing, sitting in a wheelchair  HENT:     Head: Normocephalic and atraumatic.  Eyes:     General: No scleral icterus. Cardiovascular:     Rate and Rhythm: Normal rate and regular rhythm.     Heart sounds: No murmur heard.   Pulmonary:     Comments: Breathing comfortably on room air, no conversational dyspnea.  Faint rales bilaterally. Abdominal:     General:  There is no distension.     Palpations: Abdomen is soft.  Musculoskeletal:        General: No swelling.     Cervical back: Neck supple.     Comments: Right distal lower extremity with minimal muscle mass related to previous surgical debridements.  Lymphadenopathy:     Cervical: No cervical adenopathy.  Skin:    General: Skin is warm and dry.     Findings: No rash.  Neurological:     General: No focal deficit present.     Mental Status: He is alert.     Coordination: Coordination normal.  Psychiatric:        Mood and  Affect: Mood normal.        Behavior: Behavior normal.      CBC    Component Value Date/Time   WBC 8.7 03/29/2020 1419   RBC 4.32 03/29/2020 1419   HGB 13.6 03/29/2020 1419   HCT 40.6 03/29/2020 1419   PLT 231 03/29/2020 1419   MCV 94 03/29/2020 1419   MCH 31.5 03/29/2020 1419   MCHC 33.5 03/29/2020 1419   RDW 12.7 03/29/2020 1419    CHEMISTRY CMP Latest Ref Rng & Units 03/29/2020  Glucose 65 - 99 mg/dL 98  BUN 10 - 36 mg/dL 24  Creatinine 0.76 - 1.27 mg/dL 0.94  Sodium 134 - 144 mmol/L 139  Potassium 3.5 - 5.2 mmol/L 5.3(H)  Chloride 96 - 106 mmol/L 101  CO2 20 - 29 mmol/L 26  Calcium 8.6 - 10.2 mg/dL 9.6  Total Protein 6.0 - 8.5 g/dL 7.8  Total Bilirubin 0.0 - 1.2 mg/dL 0.4  Alkaline Phos 48 - 121 IU/L 70  AST 0 - 40 IU/L 24  ALT 0 - 44 IU/L 16    PCP labs 06/27/19: BUN 23 Cr 0.89  Recent labs 03/29/2020: ESR 37  ANA negative SSA, SSB <0.2 RF <10  Chest Imaging- films reviewed: CT chest 07/20/20: subpleural cysts, intraparenchymal fibrosis without cranio-caudal gradient. No air trapping. Indeterminate for UIP. 4cm ascending aortic aneurysm. Moderate hiatal herina.   Pulmonary Functions Testing Results: PFT Results Latest Ref Rng & Units 07/27/2020  FVC-Pre L 1.82  FVC-Predicted Pre % 65  FVC-Post L 1.96  FVC-Predicted Post % 70  Pre FEV1/FVC % % 78  Post FEV1/FCV % % 68  FEV1-Pre L 1.42  FEV1-Predicted Pre % 78  FEV1-Post L 1.33  DLCO uncorrected ml/min/mmHg 10.04  DLCO corrected ml/min/mmHg 10.04   2021-no significant obstruction or bronchodilator reversibility.  Spirometry suggests restriction, but lung volumes unable to be performed to verify this.  Diffusion capacity unable to be performed.   Echocardiogram 06/22/2018: LVEF 40%, diastolic dysfunction present.  Normal LA, RV, RA.  Mild AR.     Assessment & Plan:   No diagnosis found.    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  Duration of symptoms and advanced age  may favor pulmonary asbestosis.  PFT suggestive of restriction, but lung volumes unable to be performed. -ILD packet given him -Reviewed previous ILD labs that were obtained. -Follow-up with Dr. Chase Caller in [place].  Likely will require follow-up imaging to evaluate for degree of progressiveness.  Decision to initiate antifibrotic therapy at his age will be deferred to Dr. Chase Caller.  Aortic aneurysm incidentally discovered on CT -needs 1- year follow up CT scan to monitor -avoid quinolone antibiotics  RTC in 2 months with Dr. Chase Caller.   Current Outpatient Medications:  .  aspirin EC 81 MG tablet, Take 81  mg by mouth daily., Disp: , Rfl:  .  carvedilol (COREG) 6.25 MG tablet, Take 6.25 mg by mouth 2 (two) times daily with a meal., Disp: , Rfl:  .  esomeprazole (NEXIUM) 40 MG capsule, Take 20 mg by mouth daily., Disp: , Rfl:  .  rosuvastatin (CRESTOR) 10 MG tablet, , Disp: , Rfl:  .  VENTOLIN HFA 108 (90 Base) MCG/ACT inhaler, , Disp: , Rfl:      Julian Hy, DO [facility] 07/27/2020 4:19 PM

## 2020-07-27 ENCOUNTER — Ambulatory Visit (INDEPENDENT_AMBULATORY_CARE_PROVIDER_SITE_OTHER): Payer: Medicare Other | Admitting: Critical Care Medicine

## 2020-07-27 ENCOUNTER — Other Ambulatory Visit: Payer: Self-pay

## 2020-07-27 ENCOUNTER — Encounter: Payer: Self-pay | Admitting: Critical Care Medicine

## 2020-07-27 VITALS — BP 150/78 | HR 77 | Temp 96.0°F | Ht 66.0 in | Wt 142.0 lb

## 2020-07-27 DIAGNOSIS — R5381 Other malaise: Secondary | ICD-10-CM

## 2020-07-27 DIAGNOSIS — R0600 Dyspnea, unspecified: Secondary | ICD-10-CM

## 2020-07-27 DIAGNOSIS — R0609 Other forms of dyspnea: Secondary | ICD-10-CM

## 2020-07-27 DIAGNOSIS — J849 Interstitial pulmonary disease, unspecified: Secondary | ICD-10-CM | POA: Diagnosis not present

## 2020-07-27 DIAGNOSIS — R9389 Abnormal findings on diagnostic imaging of other specified body structures: Secondary | ICD-10-CM

## 2020-07-27 LAB — PULMONARY FUNCTION TEST
DL/VA: 3.11 ml/min/mmHg/L
DLCO cor: 10.04 ml/min/mmHg
DLCO unc: 10.04 ml/min/mmHg
FEF 25-75 Post: 1.07 L/sec
FEF 25-75 Pre: 1.29 L/sec
FEF2575-%Change-Post: -16 %
FEF2575-%Pred-Post: 111 %
FEF2575-%Pred-Pre: 133 %
FEV1-%Change-Post: -6 %
FEV1-%Pred-Post: 73 %
FEV1-%Pred-Pre: 78 %
FEV1-Post: 1.33 L
FEV1-Pre: 1.42 L
FEV1FVC-%Change-Post: -12 %
FEV1FVC-%Pred-Pre: 113 %
FEV6-%Change-Post: 9 %
FEV6-%Pred-Post: 77 %
FEV6-%Pred-Pre: 71 %
FEV6-Post: 1.96 L
FEV6-Pre: 1.79 L
FEV6FVC-%Change-Post: 0 %
FEV6FVC-%Pred-Post: 110 %
FEV6FVC-%Pred-Pre: 110 %
FVC-%Change-Post: 7 %
FVC-%Pred-Post: 70 %
FVC-%Pred-Pre: 65 %
FVC-Post: 1.96 L
FVC-Pre: 1.82 L
Post FEV1/FVC ratio: 68 %
Post FEV6/FVC ratio: 100 %
Pre FEV1/FVC ratio: 78 %
Pre FEV6/FVC Ratio: 100 %

## 2020-07-27 NOTE — Patient Instructions (Addendum)
Thank you for visiting Dr. Chestine Spore at [facility]. We recommend the following:   No follow-ups on file. with Dr. Marchelle Gearing (30 minutes visit).    Please do your part to reduce the spread of COVID-19.

## 2020-07-27 NOTE — Progress Notes (Signed)
Pre and Post Spirometry and DLCO completed today

## 2020-08-01 ENCOUNTER — Ambulatory Visit (INDEPENDENT_AMBULATORY_CARE_PROVIDER_SITE_OTHER): Payer: Medicare Other | Admitting: *Deleted

## 2020-08-01 DIAGNOSIS — I442 Atrioventricular block, complete: Secondary | ICD-10-CM

## 2020-08-01 LAB — CUP PACEART REMOTE DEVICE CHECK
Battery Remaining Longevity: 78 mo
Battery Remaining Percentage: 100 %
Brady Statistic RA Percent Paced: 4 %
Brady Statistic RV Percent Paced: 97 %
Date Time Interrogation Session: 20210922001100
Implantable Lead Implant Date: 20090901
Implantable Lead Implant Date: 20090901
Implantable Lead Location: 753859
Implantable Lead Location: 753860
Implantable Lead Model: 4076
Implantable Lead Model: 4076
Implantable Pulse Generator Implant Date: 20180723
Lead Channel Impedance Value: 1039 Ohm
Lead Channel Impedance Value: 345 Ohm
Lead Channel Pacing Threshold Amplitude: 0.7 V
Lead Channel Pacing Threshold Pulse Width: 0.4 ms
Lead Channel Setting Pacing Amplitude: 1.2 V
Lead Channel Setting Pacing Amplitude: 2 V
Lead Channel Setting Pacing Pulse Width: 0.4 ms
Lead Channel Setting Sensing Sensitivity: 2.5 mV
Pulse Gen Serial Number: 357327

## 2020-08-03 NOTE — Progress Notes (Signed)
Remote pacemaker transmission.   

## 2020-08-08 ENCOUNTER — Ambulatory Visit: Payer: Medicare Other | Admitting: Podiatry

## 2020-08-24 ENCOUNTER — Ambulatory Visit (INDEPENDENT_AMBULATORY_CARE_PROVIDER_SITE_OTHER): Payer: Medicare Other | Admitting: Podiatry

## 2020-08-24 ENCOUNTER — Other Ambulatory Visit: Payer: Self-pay

## 2020-08-24 DIAGNOSIS — M79674 Pain in right toe(s): Secondary | ICD-10-CM | POA: Diagnosis not present

## 2020-08-24 DIAGNOSIS — M79675 Pain in left toe(s): Secondary | ICD-10-CM | POA: Diagnosis not present

## 2020-08-24 DIAGNOSIS — B351 Tinea unguium: Secondary | ICD-10-CM

## 2020-08-28 ENCOUNTER — Encounter: Payer: Self-pay | Admitting: Podiatry

## 2020-08-28 NOTE — Progress Notes (Signed)
  Subjective:  Patient ID: Justin Richmond, male    DOB: 03-18-1924,  MRN: [id number]  Chief Complaint  Patient presents with  . routine foot care    nail trim    86 y.o. male returns for the above complaint.  Patient presents with thickened elongated dystrophic toenails x10.  Patient would like to have them debrided down as she is not able to do it himself.  He denies any other acute complaints.  He is known to Dr. Stacie Acres.  Objective:  There were no vitals filed for this visit. Podiatric Exam: Vascular: dorsalis pedis and posterior tibial pulses are palpable bilateral. Capillary return is immediate. Temperature gradient is WNL. Skin turgor WNL  Sensorium: Normal Semmes Weinstein monofilament test. Normal tactile sensation bilaterally. Nail Exam: Pt has thick disfigured discolored nails with subungual debris noted bilateral entire nail hallux through fifth toenails.  Pain on palpation to the nails. Ulcer Exam: There is no evidence of ulcer or pre-ulcerative changes or infection. Orthopedic Exam: Muscle tone and strength are WNL. No limitations in general ROM. No crepitus or effusions noted. HAV  B/L.  Hammer toes 2-5  B/L. Skin: No Porokeratosis. No infection or ulcers    Assessment & Plan:   1. Pain due to onychomycosis of toenails of both feet     Patient was evaluated and treated and all questions answered.  Onychomycosis with pain  -Nails palliatively debrided as below. -Educated on self-care  Procedure: Nail Debridement Rationale: pain  Type of Debridement: manual, sharp debridement. Instrumentation: Nail nipper, rotary burr. Number of Nails: 10  Procedures and Treatment: Consent by patient was obtained for treatment procedures. The patient understood the discussion of treatment and procedures well. All questions were answered thoroughly reviewed. Debridement of mycotic and hypertrophic toenails, 1 through 5 bilateral and clearing of subungual debris. No ulceration, no  infection noted.  Return Visit-Office Procedure: Patient instructed to return to the office for a follow up visit 3 months for continued evaluation and treatment.  Nicholes Rough, DPM    No follow-ups on file.

## 2020-08-31 ENCOUNTER — Other Ambulatory Visit: Payer: Self-pay

## 2020-08-31 ENCOUNTER — Ambulatory Visit (INDEPENDENT_AMBULATORY_CARE_PROVIDER_SITE_OTHER): Payer: Medicare Other | Admitting: Physician Assistant

## 2020-08-31 VITALS — BP 128/64 | HR 85 | Ht 66.0 in | Wt 144.0 lb

## 2020-08-31 DIAGNOSIS — Z950 Presence of cardiac pacemaker: Secondary | ICD-10-CM | POA: Diagnosis not present

## 2020-08-31 DIAGNOSIS — R0602 Shortness of breath: Secondary | ICD-10-CM

## 2020-08-31 DIAGNOSIS — I10 Essential (primary) hypertension: Secondary | ICD-10-CM | POA: Diagnosis not present

## 2020-08-31 DIAGNOSIS — I493 Ventricular premature depolarization: Secondary | ICD-10-CM | POA: Diagnosis not present

## 2020-08-31 NOTE — Progress Notes (Signed)
Cardiology Office Note Date:  08/31/2020  Patient ID:  Justin Richmond, DOB [birth year], MRN [id number] PCP:  Johny Blamer, MD  Cardiologist/Electrophysiologist: Dr. Graciela Husbands (new, Sept 2020) Pulmonology: Dr. Dot Been, Dr. Marchelle Gearing [facility])    Chief Complaint: SOB  History of Present Illness: Justin Richmond is a 86 y.o. male with history of CHB w/p PPM, spinal stenosis with b/l neuropathy, ILD, HTN, HLD.  moved from [place] to be with his daughter  He comes in today to be seen fopr Dr. Graciela Husbands.  Last seen by him (and the 1st time) 08/03/19 to establish EP and PPM care. DATE TEST EF   2002 LHC    % Ramus severe Mild diffuse disease  11/14 MYOVIEW  36 % Mod anteroapical Fixed defect  6/18 Echo  74%   06/22/2018 echo with LVEF 73%, diastolic dysfunction (grade I), mild AI          07/15/2017 Lexiscan myoview, negative for ischemia, EF 66%   He noted A lead failure and a polarity switch to unipolar with normalized impedance. Dyspnea felt multifactorial, debility, interstitial lung disease noted on CT scanning, and possibly some coronary artery disease resulting in ischemia.  Recommended f/u with PMD and see EP back again 3-55mo.  He saw pulm service Sept/2021, described as having chronic SOB for several years, described as chroinically at about 10 steps requiring him stop to catch his breath. Ambulates minimally due to pain and instaibility mostly using wheelchair or scooter. At this visit states most recently had developed some degree of rest SOB. Pulm dx Pulmonary fibrosis concerning for IPF vs p. Asbestos.  PFTs suggestive of restrictive Planned for f/u with Dr. Marchelle Gearing in ILD clinic.    His PMD is sending him back for SOB. High res CT 07/20/2020  Lungs/Pleura: There is a pattern of mild pulmonary fibrosis without apical to basal gradient, featuring areas of irregular peripheral consolidation and subpleural bronchiolectasis without clear evidence of honeycombing. No  significant air trapping on expiratory phase imaging. No pleural effusion or pneumothorax  IMPRESSION: 1. There is a pattern of mild pulmonary fibrosis without apical to basal gradient, featuring areas of irregular peripheral consolidation and subpleural bronchiolectasis without clear evidence of honeycombing. Findings are in an "indeterminate for UIP" pattern by ATS pulmonary fibrosis criteria given unusual distribution of findings, differential considerations including both UIP and NSIP. Consider follow-up examination in 1 year to assess for stability of fibrotic findings and pattern. 2. Enlargement of the tubular ascending thoracic aorta measuring up to 4.0 x 3.8 cm. Recommend annual imaging followup by CTA or MRA if clinically appropriate. This recommendation follows 2010 ACCF/AHA/AATS/ACR/ASA/SCA/SCAI/SIR/STS/SVM Guidelines for the Diagnosis and Management of Patients with Thoracic Aortic Disease. Circulation. 2010; 121: T701-X793. Aortic aneurysm NOS (ICD10-I71.9) Aortic aneurysm NOS (ICD10-I71.9) Aortic Atherosclerosis (ICD10-I70.0). 3. Coronary artery disease. 4. Moderate hiatal hernia with intrathoracic position of the gastric Fundus.  TODAY He is accompanied by his daughter The patient comes in a wheelchair, he reports he ambulates very little, mentions not only his spinal stenosis but severe scoliosis make walking very difficult.  He will ambulate in the house with a walker, but uses wheelchair or scooter for any need to go any further distances or outside. He states she has been SOB for many years. It is very difficult to get a clear picture of changes, escalation in this symptoms.  His daughter notes with  Minimal exertion he is breathing heavier. He says occasionally he will feel a little breathless at rest. No symptoms of orthopnea or  PND that I can elicit clearly. He has hit/miss resting SOB some occur at night but do not require him to get up or do anything to for it  to settle. He had 3 days ago a brief episode of CP, was at rest, central, nonradiating, no associated symptoms and no particular SOB either.  Lasted maybe a minute and resolved. No dizzy spells, near syncope or syncope.  He has observed when checking his pulse he will feel skipped beats and is concerned about his pacemaker.   Device information BSCi dual chamber PPM implanted 2009, gen change 2018 Has Medtronic leads NOT and MRI compatible system  Past Medical History:  Diagnosis Date  . Arrhythmia   . Balance problems   . Chronic back pain   . Chronic constipation   . Congestive heart failure ([facility])   . Diarrhea   . Eczema   . Eye disease    cataracts/glaucoma  . GERD (gastroesophageal reflux disease)   . Hearing loss   . Hemorrhoids   . High cholesterol   . Hypertension   . Irregular heartbeat   . Joint pain   . Joint stiffness   . Migraine headache   . Mouth sores   . Muscle weakness   . OAB (overactive bladder)   . Osteoarthritis   . Osteomyelitis ([facility])    childhood, left leg  . Pacemaker   . Poor circulation   . Pure hypercholesterolemia   . Rectal bleeding   . Recurrent UTI   . Scoliosis   . Shortness of breath   . Skin disorder   . Spinal stenosis, lumbar region with neurogenic claudication     Past Surgical History:  Procedure Laterality Date  . DENTAL SURGERY     tooth removal, root canals  . INSERT / REPLACE / REMOVE PACEMAKER    . osteomyelitis surgery     x10 surgeries  . SPINAL INJECTION     x3     Current Outpatient Medications  Medication Sig Dispense Refill  . aspirin EC 81 MG tablet Take 81 mg by mouth daily.    . carvedilol (COREG) 6.25 MG tablet Take 6.25 mg by mouth 2 (two) times daily with a meal.    . esomeprazole (NEXIUM) 40 MG capsule Take 20 mg by mouth daily.    Marland Kitchen gabapentin (NEURONTIN) 100 MG capsule Take 100 mg by mouth daily.     Marland Kitchen ofloxacin (FLOXIN) 0.3 % OTIC solution     . rosuvastatin (CRESTOR) 10 MG tablet     .  VENTOLIN HFA 108 (90 Base) MCG/ACT inhaler Inhale 2 puffs into the lungs every 4 (four) hours as needed.      No current facility-administered medications for this visit.    Allergies:   Penicillins   Social History:  The patient  reports that he quit smoking about 70 years ago. His smoking use included cigarettes. He has a 0.50 pack-year smoking history. He has never used smokeless tobacco. He reports that he does not drink alcohol and does not use drugs.   Family History:  The patient's family history includes Ankylosing spondylitis in his brother; Asthma in his son; Cancer in his sister; Other in his father; Stroke in his mother.  ROS:  Please see the history of present illness.    All other systems are reviewed and otherwise negative.   PHYSICAL EXAM:  VS:  BP 128/64   Pulse 85   Ht 5\' 6"  (1.676 m)   Wt 144 lb (  65.3 kg)   SpO2 95%   BMI 23.24 kg/m  BMI: Body mass index is 23.24 kg/m. Well nourished, well developed, in no acute distress HEENT: normocephalic, atraumatic Neck: no JVD, carotid bruits or masses Cardiac:  RRR; no significant murmurs, no rubs, or gallops Lungs:  CTA b/l, no wheezing, rhonchi or rales Abd: soft, nontender MS: no deformity, age appropriate, perhaps advanced atrophy Ext: no edema Skin: warm and dry, no rash Neuro:  No gross deficits appreciated Psych: euthymic mood, full affect  PPM site is stable, no tethering or discomfort   EKG:  Done today and reviewed by myself shows  SR?VP, some AV paced beats, PVCs  Device interrogation done today and reviewed by myself:  Battery and lead measurements are good No episodes There are AT/AF in a arrhythmia report log since Sept 2020, without EGM, longest 33min50sec.  Recent Labs: 03/29/2020: ALT 16; BUN 24; Creatinine, Ser 0.94; Hemoglobin 13.6; Platelets 231; Potassium 5.3; Sodium 139; TSH 1.850  No results found for requested labs within last 8760 hours.   CrCl cannot be calculated (Patient's most  recent lab result is older than the maximum 21 days allowed.).   Wt Readings from Last 3 Encounters:  08/31/20 144 lb (65.3 kg)  07/27/20 142 lb (64.4 kg)  05/16/20 144 lb 3.2 oz (65.4 kg)     Other studies reviewed: Additional studies/records reviewed today include: summarized above  ASSESSMENT AND PLAN:  1. PPM     Intact function  2. HTN     Looks good  3. CAD     No anginal sounding symptoms  4. SOB 5. ILD 6. Grade I DD     I do not suspect volume OL, hios CT last month was with edema or effusions     Exam does not support volume OL    SOB sounds chronic with perhaps slow progression, he denies any acute changes    Given his sever scoliosis, this may also play a role  7. PVCs     None heard on auscultation, burden appears low by pacer check   Disposition: F/u with remotes as usual, will have him back in 79mo, sooner if needed  Current medicines are reviewed at length with the patient today.  The patient did not have any concerns regarding medicines.  Norma Fredrickson, PA-C 08/31/2020 3:14 PM     [facility] [address] [place] [address] [phone] (office)  [phone] (fax)

## 2020-08-31 NOTE — Patient Instructions (Signed)
Medication Instructions:   Your physician recommends that you continue on your current medications as directed. Please refer to the Current Medication list given to you today.   *If you need a refill on your cardiac medications before your next appointment, please call your pharmacy*   Lab Work: NONE ORDERED  TODAY    If you have labs (blood work) drawn today and your tests are completely normal, you will receive your results only by: Marland Kitchen MyChart Message (if you have MyChart) OR . A paper copy in the mail If you have any lab test that is abnormal or we need to change your treatment, we will call you to review the results.   Testing/Procedures: NONE ORDERED  TODAY   Follow-Up: At [facility], you and your health needs are our priority.  As part of our continuing mission to provide you with exceptional heart care, we have created designated Provider Care Teams.  These Care Teams include your primary Cardiologist (physician) and Advanced Practice Providers (APPs -  Physician Assistants and Nurse Practitioners) who all work together to provide you with the care you need, when you need it.  We recommend signing up for the patient portal called "MyChart".  Sign up information is provided on this After Visit Summary.  MyChart is used to connect with patients for Virtual Visits (Telemedicine).  Patients are able to view lab/test results, encounter notes, upcoming appointments, etc.  Non-urgent messages can be sent to your provider as well.   To learn more about what you can do with MyChart, go to [email or web address].    Your next appointment:   6 month(s)  The format for your next appointment:   In Person  Provider:   You will see one of the following Advanced Practice Providers on your designated Care Team:    Gypsy Balsam, NP  Francis Dowse, PA-C  Casimiro Needle "Otilio Saber, [place]      Other Instructions

## 2020-09-04 ENCOUNTER — Other Ambulatory Visit: Payer: Self-pay

## 2020-09-04 ENCOUNTER — Encounter: Payer: Self-pay | Admitting: Pulmonary Disease

## 2020-09-04 ENCOUNTER — Ambulatory Visit (INDEPENDENT_AMBULATORY_CARE_PROVIDER_SITE_OTHER): Payer: Medicare Other | Admitting: Pulmonary Disease

## 2020-09-04 VITALS — BP 140/72 | HR 74 | Temp 97.3°F | Ht 66.0 in | Wt 145.2 lb

## 2020-09-04 DIAGNOSIS — J849 Interstitial pulmonary disease, unspecified: Secondary | ICD-10-CM

## 2020-09-04 NOTE — Patient Instructions (Signed)
I have reviewed your CT scan and lung function test which shows mild changes from a condition called interstitial lung disease At this point I would recommend conservative approach without any treatment.  I would not recommend further work-up such as biopsy We will continue to monitor this with repeat lung function test in 6 months and follow-up in clinic after that

## 2020-09-04 NOTE — Progress Notes (Signed)
Justin Richmond    [id number]    09/14/1924  Primary Care Physician:Harris, Chrissie Noa, MD  Referring Physician: Johny Blamer, MD [phone] [address] [place],  [place] [address]  Chief complaint: Consult for interstitial lung disease  HPI: 86 year old with history of CHF, complete heart block status post PPM, arrhythmias, GERD, spinal stenosis, childhood osteomyelitis of left leg Referred for evaluation of pulmonary fibrosis noted on CT scan He has chronic dyspnea on exertion for many years without any worsening.  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  No ongoing mold, hot tub, Jacuzzi.  No feather pillows or comforters Smoking history: Smoked 0.75 packs/day for 4 years.  Quit in 1951 Travel history: Originally from [place] in [place].  No significant recent travel Relevant family history: Father was a Forensic scientist and died of black lung   Outpatient Encounter Medications as of 09/04/2020  Medication Sig   aspirin EC 81 MG tablet Take 81 mg by mouth daily.   carvedilol (COREG) 6.25 MG tablet Take 6.25 mg by mouth 2 (two) times daily with a meal.   esomeprazole (NEXIUM) 40 MG capsule Take 20 mg by mouth daily.   gabapentin (NEURONTIN) 100 MG capsule Take 100 mg by mouth daily.    rosuvastatin (CRESTOR) 10 MG tablet    VENTOLIN HFA 108 (90 Base) MCG/ACT inhaler Inhale 2 puffs into the lungs every 4 (four) hours as needed.    [DISCONTINUED] ofloxacin (FLOXIN) 0.3 % OTIC solution    No facility-administered encounter medications on file as of 09/04/2020.    Allergies as of 09/04/2020 - Review Complete 09/04/2020  Allergen Reaction Noted   Penicillins  03/29/2020    Past Medical History:  Diagnosis Date   Arrhythmia    Balance problems    Chronic back  pain    Chronic constipation    Congestive heart failure (HCC)    Diarrhea    Eczema    Eye disease    cataracts/glaucoma   GERD (gastroesophageal reflux disease)    Hearing loss    Hemorrhoids    High cholesterol    Hypertension    Irregular heartbeat    Joint pain    Joint stiffness    Migraine headache    Mouth sores    Muscle weakness    OAB (overactive bladder)    Osteoarthritis    Osteomyelitis (HCC)    childhood, left leg   Pacemaker    Poor circulation    Pure hypercholesterolemia    Rectal bleeding    Recurrent UTI    Scoliosis    Shortness of breath    Skin disorder    Spinal stenosis, lumbar region with neurogenic claudication     Past Surgical History:  Procedure Laterality Date   DENTAL SURGERY     tooth removal, root canals   INSERT / REPLACE / REMOVE PACEMAKER     osteomyelitis surgery     x10 surgeries   SPINAL INJECTION     x3     Family History  Problem Relation Age of Onset   Stroke Mother    Other Father        "black lung disease"   Cancer Sister    Ankylosing spondylitis Brother    Asthma Son     Social History  Socioeconomic History   Marital status: Widowed    Spouse name: Not on file   Number of children: 4   Years of education: Not on file   Highest education level: Professional school degree (e.g., MD, DDS, DVM, JD)  Occupational History   Not on file  Tobacco Use   Smoking status: Former Smoker    Packs/day: 0.25    Years: 2.00    Pack years: 0.50    Types: Cigarettes    Quit date: 05/16/1950    Years since quitting: 70.3   Smokeless tobacco: Never Used  Vaping Use   Vaping Use: Never used  Substance and Sexual Activity   Alcohol use: Never    Comment: in college   Drug use: Never    Comment: no   Sexual activity: Not Currently  Other Topics Concern   Not on file  Social History Narrative   Lives with daughter   Right handed   Caffeine: 1 cup/day   Social  Determinants of Health   Financial Resource Strain:    Difficulty of Paying Living Expenses: Not on file  Food Insecurity:    Worried About Programme researcher, broadcasting/film/video in the Last Year: Not on file   [employer] of Food in the Last Year: Not on file  Transportation Needs:    Lack of Transportation (Medical): Not on file   Lack of Transportation (Non-Medical): Not on file  Physical Activity:    Days of Exercise per Week: Not on file   Minutes of Exercise per Session: Not on file  Stress:    Feeling of Stress : Not on file  Social Connections:    Frequency of Communication with Friends and Family: Not on file   Frequency of Social Gatherings with Friends and Family: Not on file   Attends Religious Services: Not on file   Active Member of Clubs or Organizations: Not on file   Attends Banker Meetings: Not on file   Marital Status: Not on file  Intimate Partner Violence:    Fear of Current or Ex-Partner: Not on file   Emotionally Abused: Not on file   Physically Abused: Not on file   Sexually Abused: Not on file    Review of systems: Review of Systems  Constitutional: Negative for fever and chills.  HENT: Negative.   Eyes: Negative for blurred vision.  Respiratory: as per HPI  Cardiovascular: Negative for chest pain and palpitations.  Gastrointestinal: Negative for vomiting, diarrhea, blood per rectum. Genitourinary: Negative for dysuria, urgency, frequency and hematuria.  Musculoskeletal: Negative for myalgias, back pain and joint pain.  Skin: Negative for itching and rash.  Neurological: Negative for dizziness, tremors, focal weakness, seizures and loss of consciousness.  Endo/Heme/Allergies: Negative for environmental allergies.  Psychiatric/Behavioral: Negative for depression, suicidal ideas and hallucinations.  All other systems reviewed and are negative.  Physical Exam: Blood pressure 140/72, pulse 74, temperature (!) 97.3 F (36.3 C), temperature  source Skin, height 5\' 6"  (1.676 m), weight 145 lb 3.2 oz (65.9 kg), SpO2 94 %. Gen:      No acute distress HEENT:  EOMI, sclera anicteric Neck:     No masses; no thyromegaly Lungs:    Clear to auscultation bilaterally; normal respiratory effort CV:         Regular rate and rhythm; no murmurs Abd:      + bowel sounds; soft, non-tender; no palpable masses, no distension Ext:    No edema; adequate peripheral perfusion Skin:  Warm and dry; no rash Neuro: alert and oriented x 3 Psych: normal mood and affect  Data Reviewed: Imaging: High-res CT 07/20/2020-mild pulmonary fibrosis without basal gradient with irregular consolidation, bronchiolectasis.  No honeycombing.  Indeterminate for UIP.  I have reviewed the images personally.  PFTs: 07/27/2020 FVC 1.82 [85%], FEV1 1.42 [38%], F/F 78, DLCO 10.04 Likely has mild restriction.  Unable to complete lung volumes.  Labs: CTD serologies 03/29/2020 ANA, SSA, SSB, rheumatoid factor, Ro, La-negative  Assessment:  Interstitial lung disease CT scan reviewed with nonspecific alternate pattern. Has remote asbestos exposure but CT scan is not typical for asbestosis. Given his age and frailty I would not recommend aggressive work-up or treatment at present  We can continue monitoring and if there is progression consider antifibrotic's in the future Discussed in detail with patient and daughter in clinic today.   Plan/Recommendations: Spirometry, diffusion capacity in 6 months  Chilton Greathouse MD [facility] 09/04/2020, 12:21 PM  CC: Johny Blamer, MD

## 2020-10-31 ENCOUNTER — Ambulatory Visit (INDEPENDENT_AMBULATORY_CARE_PROVIDER_SITE_OTHER): Payer: Medicare Other

## 2020-10-31 DIAGNOSIS — I442 Atrioventricular block, complete: Secondary | ICD-10-CM | POA: Diagnosis not present

## 2020-11-04 LAB — CUP PACEART REMOTE DEVICE CHECK
Battery Remaining Longevity: 72 mo
Battery Remaining Percentage: 100 %
Brady Statistic RA Percent Paced: 6 %
Brady Statistic RV Percent Paced: 92 %
Date Time Interrogation Session: 20211222001100
Implantable Lead Implant Date: 20090901
Implantable Lead Implant Date: 20090901
Implantable Lead Location: 753859
Implantable Lead Location: 753860
Implantable Lead Model: 4076
Implantable Lead Model: 4076
Implantable Pulse Generator Implant Date: 20180723
Lead Channel Impedance Value: 1011 Ohm
Lead Channel Impedance Value: 358 Ohm
Lead Channel Pacing Threshold Amplitude: 0.6 V
Lead Channel Pacing Threshold Pulse Width: 0.4 ms
Lead Channel Setting Pacing Amplitude: 1.2 V
Lead Channel Setting Pacing Amplitude: 2 V
Lead Channel Setting Pacing Pulse Width: 0.4 ms
Lead Channel Setting Sensing Sensitivity: 2.5 mV
Pulse Gen Serial Number: 357327

## 2020-11-07 ENCOUNTER — Other Ambulatory Visit: Payer: Self-pay | Admitting: Physician Assistant

## 2020-11-07 DIAGNOSIS — U071 COVID-19: Secondary | ICD-10-CM

## 2020-11-07 DIAGNOSIS — I10 Essential (primary) hypertension: Secondary | ICD-10-CM

## 2020-11-07 DIAGNOSIS — Z950 Presence of cardiac pacemaker: Secondary | ICD-10-CM

## 2020-11-07 DIAGNOSIS — J849 Interstitial pulmonary disease, unspecified: Secondary | ICD-10-CM

## 2020-11-07 NOTE — Progress Notes (Signed)
I connected by phone with [name] on 11/07/2020 at 5:18 PM to discuss the potential use of a new treatment for mild to moderate COVID-19 viral infection in non-hospitalized patients.  This patient is a 86 y.o. male that meets the FDA criteria for Emergency Use Authorization of COVID monoclonal antibody sotrovimab, casirivimab/imdevimab or bamlamivimab/estevimab.  Has a (+) direct SARS-CoV-2 viral test result  Has mild or moderate COVID-19   Is NOT hospitalized due to COVID-19  Is within 10 days of symptom onset  Has at least one of the high risk factor(s) for progression to severe COVID-19 and/or hospitalization as defined in EUA.  Specific high risk criteria : Older age (>/= 84 yo), Cardiovascular disease or hypertension, Chronic Lung Disease and Other high risk medical condition per CDC:  unvaccinated.    I have spoken and communicated the following to the patient or parent/caregiver regarding COVID monoclonal antibody treatment:  1. FDA has authorized the emergency use for the treatment of mild to moderate COVID-19 in adults and pediatric patients with positive results of direct SARS-CoV-2 viral testing who are 75 years of age and older weighing at least 40 kg, and who are at high risk for progressing to severe COVID-19 and/or hospitalization.  2. The significant known and potential risks and benefits of COVID monoclonal antibody, and the extent to which such potential risks and benefits are unknown.  3. Information on available alternative treatments and the risks and benefits of those alternatives, including clinical trials.  4. Patients treated with COVID monoclonal antibody should continue to self-isolate and use infection control measures (e.g., wear mask, isolate, social distance, avoid sharing personal items, clean and disinfect "high touch" surfaces, and frequent handwashing) according to CDC guidelines.   5. The patient or parent/caregiver has the option to accept or  refuse COVID monoclonal antibody treatment.  After reviewing this information with the patient, the patient has agreed to receive one of the available covid 19 monoclonal antibodies and will be provided an appropriate fact sheet prior to infusion.  Sx onset 12/28. Set up for infusion on 12/30 @ 10:30am. Directions given to [facility]. Pt is aware that insurance will be charged an infusion fee. Pt is unvaccinated. + home test.   [name] 11/07/2020 5:18 PM

## 2020-11-08 ENCOUNTER — Ambulatory Visit (HOSPITAL_COMMUNITY)
Admission: RE | Admit: 2020-11-08 | Discharge: 2020-11-08 | Disposition: A | Discharge status: Home / Self Care | Payer: Medicare Other | Source: Ambulatory Visit | Attending: Pulmonary Disease | Admitting: Pulmonary Disease

## 2020-11-08 DIAGNOSIS — I10 Essential (primary) hypertension: Secondary | ICD-10-CM | POA: Diagnosis present

## 2020-11-08 DIAGNOSIS — Z950 Presence of cardiac pacemaker: Secondary | ICD-10-CM

## 2020-11-08 DIAGNOSIS — Z23 Encounter for immunization: Secondary | ICD-10-CM | POA: Diagnosis not present

## 2020-11-08 DIAGNOSIS — J849 Interstitial pulmonary disease, unspecified: Secondary | ICD-10-CM | POA: Diagnosis present

## 2020-11-08 DIAGNOSIS — U071 COVID-19: Secondary | ICD-10-CM | POA: Diagnosis present

## 2020-11-08 MED ORDER — EPINEPHRINE 0.3 MG/0.3ML IJ SOAJ: 0.3000 mg | Freq: Once | INTRAMUSCULAR | Status: DC | PRN

## 2020-11-08 MED ORDER — ALBUTEROL SULFATE HFA 108 (90 BASE) MCG/ACT IN AERS: 2.0000 | INHALATION_SPRAY | Freq: Once | RESPIRATORY_TRACT | Status: DC | PRN

## 2020-11-08 MED ORDER — METHYLPREDNISOLONE SODIUM SUCC 125 MG IJ SOLR: 125.0000 mg | Freq: Once | INTRAMUSCULAR | Status: DC | PRN

## 2020-11-08 MED ORDER — DIPHENHYDRAMINE HCL 50 MG/ML IJ SOLN: 50.0000 mg | Freq: Once | INTRAMUSCULAR | Status: DC | PRN

## 2020-11-08 MED ORDER — FAMOTIDINE IN NACL 20-0.9 MG/50ML-% IV SOLN: 20.0000 mg | Freq: Once | INTRAVENOUS | Status: DC | PRN

## 2020-11-08 MED ORDER — SODIUM CHLORIDE 0.9 % IV SOLN: INTRAVENOUS | Status: DC | PRN

## 2020-11-08 MED ORDER — SODIUM CHLORIDE 0.9 % IV SOLN: Freq: Once | INTRAVENOUS | Status: AC

## 2020-11-08 NOTE — Progress Notes (Signed)
Patient reviewed Fact Sheet for Patients, Parents, and Caregivers for Emergency Use Authorization (EUA) of Casi/Regen for the Treatment of Coronavirus. Patient also reviewed and is agreeable to the estimated cost of treatment. Patient is agreeable to proceed.    

## 2020-11-08 NOTE — Progress Notes (Signed)
°  Diagnosis: COVID-19  Physician: Dr. Wright   Procedure: Covid Infusion Clinic Med: casirivimab\imdevimab infusion - Provided patient with casirivimab\imdevimab fact sheet for patients, parents and caregivers prior to infusion.  Complications: No immediate complications noted.  Discharge: Discharged home   Anab Vivar  B Chantelle Verdi 11/08/2020   

## 2020-11-08 NOTE — Discharge Instructions (Signed)
10 Things You Can Do to Manage Your COVID-19 Symptoms at Home If you have possible or confirmed COVID-19: 1. Stay home from work and school. And stay away from other public places. If you must go out, avoid using any kind of public transportation, ridesharing, or taxis. 2. Monitor your symptoms carefully. If your symptoms get worse, call your healthcare provider immediately. 3. Get rest and stay hydrated. 4. If you have a medical appointment, call the healthcare provider ahead of time and tell them that you have or may have COVID-19. 5. For medical emergencies, call 911 and notify the dispatch personnel that you have or may have COVID-19. 6. Cover your cough and sneezes with a tissue or use the inside of your elbow. 7. Wash your hands often with soap and water for at least 20 seconds or clean your hands with an alcohol-based hand sanitizer that contains at least 60% alcohol. 8. As much as possible, stay in a specific room and away from other people in your home. Also, you should use a separate bathroom, if available. If you need to be around other people in or outside of the home, wear a mask. 9. Avoid sharing personal items with other people in your household, like dishes, towels, and bedding. 10. Clean all surfaces that are touched often, like counters, tabletops, and doorknobs. Use household cleaning sprays or wipes according to the label instructions. cdc.gov/coronavirus 05/11/2019 This information is not intended to replace advice given to you by your health care provider. Make sure you discuss any questions you have with your health care provider. Document Revised: 10/13/2019 Document Reviewed: 10/13/2019 Elsevier Patient Education  2020 Elsevier Inc. What types of side effects do monoclonal antibody drugs cause?  Common side effects  In general, the more common side effects caused by monoclonal antibody drugs include: . Allergic reactions, such as hives or itching . Flu-like signs and  symptoms, including chills, fatigue, fever, and muscle aches and pains . Nausea, vomiting . Diarrhea . Skin rashes . Low blood pressure   The CDC is recommending patients who receive monoclonal antibody treatments wait at least 90 days before being vaccinated.  Currently, there are no data on the safety and efficacy of mRNA COVID-19 vaccines in persons who received monoclonal antibodies or convalescent plasma as part of COVID-19 treatment. Based on the estimated half-life of such therapies as well as evidence suggesting that reinfection is uncommon in the 90 days after initial infection, vaccination should be deferred for at least 90 days, as a precautionary measure until additional information becomes available, to avoid interference of the antibody treatment with vaccine-induced immune responses. If you have any questions or concerns after the infusion please call the Advanced Practice Provider on call at [phone]. This number is ONLY intended for your use regarding questions or concerns about the infusion post-treatment side-effects.  Please do not provide this number to others for use. For return to work notes please contact your primary care provider.   If someone you know is interested in receiving treatment please have them call the COVID hotline at [phone].   

## 2020-11-14 NOTE — Progress Notes (Signed)
Remote pacemaker transmission.   

## 2020-11-28 ENCOUNTER — Ambulatory Visit: Payer: Medicare Other | Admitting: Podiatry

## 2021-01-30 ENCOUNTER — Ambulatory Visit (INDEPENDENT_AMBULATORY_CARE_PROVIDER_SITE_OTHER): Payer: Medicare Other

## 2021-01-30 DIAGNOSIS — I442 Atrioventricular block, complete: Secondary | ICD-10-CM | POA: Diagnosis not present

## 2021-01-30 LAB — CUP PACEART REMOTE DEVICE CHECK
Battery Remaining Longevity: 72 mo
Battery Remaining Percentage: 100 %
Brady Statistic RA Percent Paced: 3 %
Brady Statistic RV Percent Paced: 95 %
Date Time Interrogation Session: 20220323125000
Implantable Lead Implant Date: 20090901
Implantable Lead Implant Date: 20090901
Implantable Lead Location: 753859
Implantable Lead Location: 753860
Implantable Lead Model: 4076
Implantable Lead Model: 4076
Implantable Pulse Generator Implant Date: 20180723
Lead Channel Impedance Value: 328 Ohm
Lead Channel Impedance Value: 819 Ohm
Lead Channel Pacing Threshold Amplitude: 0.7 V
Lead Channel Pacing Threshold Pulse Width: 0.4 ms
Lead Channel Setting Pacing Amplitude: 1.3 V
Lead Channel Setting Pacing Amplitude: 2 V
Lead Channel Setting Pacing Pulse Width: 0.4 ms
Lead Channel Setting Sensing Sensitivity: 2.5 mV
Pulse Gen Serial Number: 357327

## 2021-02-08 ENCOUNTER — Other Ambulatory Visit (HOSPITAL_COMMUNITY)
Admission: RE | Admit: 2021-02-08 | Discharge: 2021-02-08 | Disposition: A | Discharge status: Home / Self Care | Payer: Medicare Other | Source: Ambulatory Visit | Attending: Pulmonary Disease | Admitting: Pulmonary Disease

## 2021-02-08 DIAGNOSIS — Z20822 Contact with and (suspected) exposure to covid-19: Secondary | ICD-10-CM | POA: Insufficient documentation

## 2021-02-08 DIAGNOSIS — Z01812 Encounter for preprocedural laboratory examination: Secondary | ICD-10-CM | POA: Insufficient documentation

## 2021-02-08 NOTE — Progress Notes (Signed)
Remote pacemaker transmission.   

## 2021-02-09 LAB — SARS CORONAVIRUS 2 (TAT 6-24 HRS): SARS Coronavirus 2: NEGATIVE

## 2021-02-12 ENCOUNTER — Other Ambulatory Visit: Payer: Self-pay

## 2021-02-12 ENCOUNTER — Ambulatory Visit (INDEPENDENT_AMBULATORY_CARE_PROVIDER_SITE_OTHER): Payer: Medicare Other | Admitting: Pulmonary Disease

## 2021-02-12 DIAGNOSIS — J849 Interstitial pulmonary disease, unspecified: Secondary | ICD-10-CM

## 2021-02-12 NOTE — Patient Instructions (Signed)
Spirometry performed today. 

## 2021-02-12 NOTE — Progress Notes (Signed)
Spirometry only performed today. 

## 2021-02-14 ENCOUNTER — Ambulatory Visit (INDEPENDENT_AMBULATORY_CARE_PROVIDER_SITE_OTHER): Payer: Medicare Other | Admitting: Pulmonary Disease

## 2021-02-14 ENCOUNTER — Telehealth: Payer: Self-pay | Admitting: Pulmonary Disease

## 2021-02-14 ENCOUNTER — Other Ambulatory Visit: Payer: Self-pay

## 2021-02-14 ENCOUNTER — Encounter: Payer: Self-pay | Admitting: Pulmonary Disease

## 2021-02-14 VITALS — BP 130/72 | HR 91 | Temp 97.3°F | Ht 66.0 in | Wt 137.0 lb

## 2021-02-14 DIAGNOSIS — R0600 Dyspnea, unspecified: Secondary | ICD-10-CM

## 2021-02-14 DIAGNOSIS — J849 Interstitial pulmonary disease, unspecified: Secondary | ICD-10-CM

## 2021-02-14 DIAGNOSIS — R0609 Other forms of dyspnea: Secondary | ICD-10-CM

## 2021-02-14 MED ORDER — BUDESONIDE-FORMOTEROL FUMARATE 160-4.5 MCG/ACT IN AERO: 2.0000 | INHALATION_SPRAY | Freq: Two times a day (BID) | RESPIRATORY_TRACT | 5 refills | Status: DC

## 2021-02-14 MED ORDER — PREDNISONE 20 MG PO TABS: ORAL_TABLET | ORAL | 0 refills | Status: DC

## 2021-02-14 MED ORDER — AIRDUO DIGIHALER 232-14 MCG/ACT IN AEPB: 1.0000 | INHALATION_SPRAY | Freq: Two times a day (BID) | RESPIRATORY_TRACT | 5 refills | Status: DC

## 2021-02-14 NOTE — Patient Instructions (Addendum)
We will start you on prednisone 40 mg a day for 2 weeks Schedule high-resolution CT  Prescribed airduo respiclick 232 1 puff twice daily  Follow up in 3 months

## 2021-02-14 NOTE — Progress Notes (Signed)
Justin Richmond    [id number]    [birth year]  Primary Care Physician:Harris, Chrissie Noa, MD  Referring Physician: Johny Blamer, MD [phone] [place],  [place] [address]  Chief complaint: Follow for interstitial lung disease  HPI:  87 year old with history of CHF, complete heart block status post PPM, arrhythmias, GERD, spinal stenosis, childhood osteomyelitis of left leg Referred for evaluation of pulmonary fibrosis noted on CT scan He has chronic dyspnea on exertion for many years without any worsening.  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  No ongoing mold, hot tub, Jacuzzi.  No feather pillows or comforters Smoking history: Smoked 0.75 packs/day for 4 years.  Quit in 1951 Travel history: Originally from [place] in [place].  No significant recent travel Relevant family history: Father was a Forensic scientist and died of black lung  Interim history: Developed COVID-19 in December 2021.  He was treated with monoclonal antibody Breathing has worsened since then.  He has increased dyspnea on exertion, denies any cough, congestion  His primary care had given him samples of bevespi.  He cannot tell if it makes a difference and it is too expensive to get a prescription of bevespi through his insurance as it cost $500  Outpatient Encounter Medications as of 02/14/2021  Medication Sig  . Ascorbic Acid (VITAMIN C) 500 MG CAPS See admin instructions.  Marland Kitchen aspirin EC 81 MG tablet Take 81 mg by mouth daily.  . carvedilol (COREG) 6.25 MG tablet Take 6.25 mg by mouth 2 (two) times daily with a meal.  . esomeprazole (NEXIUM) 40 MG capsule Take 20 mg by mouth daily.  Marland Kitchen gabapentin (NEURONTIN) 100 MG capsule Take 100 mg by mouth daily.   . Garlic 500 MG CAPS See admin instructions.   . Magnesium Malate POWD See admin instructions.  Marland Kitchen oxybutynin (DITROPAN) 5 MG tablet 1 tablet  . rosuvastatin (CRESTOR) 10 MG tablet   . Turmeric 450 MG CAPS See admin instructions.  . VENTOLIN HFA 108 (90 Base) MCG/ACT inhaler Inhale 2 puffs into the lungs every 4 (four) hours as needed.   . Vitamin A 7.5 MG (25000 UT) CAPS See admin instructions.   No facility-administered encounter medications on file as of 02/14/2021.    Physical Exam: Blood pressure 130/72, pulse 91, temperature (!) 97.3 F (36.3 C), temperature source Temporal, height 5\' 6"  (1.676 m), weight 137 lb (62.1 kg), SpO2 93 %. Gen:      No acute distress HEENT:  EOMI, sclera anicteric Neck:     No masses; no thyromegaly Lungs:    Clear to auscultation bilaterally; normal respiratory effort CV:         Regular rate and rhythm; no murmurs Abd:      + bowel sounds; soft, non-tender; no palpable masses, no distension Ext:    No edema; adequate peripheral perfusion Skin:      Warm and dry; no rash Neuro: alert and oriented x 3 Psych: normal mood and affect  Data Reviewed: Imaging: High-res CT 07/20/2020-mild pulmonary fibrosis without basal gradient with irregular consolidation, bronchiolectasis.  No honeycombing.  Indeterminate for UIP.  I have reviewed the images personally.  PFTs: 07/27/2020 FVC 1.82 [85%], FEV1 1.42 [38%], F/F 78, DLCO 10.04 Likely has mild restriction.  Unable to complete lung volumes.  02/12/2021 FVC  1.71 [62%], FEV1 1.37 [concentration], F/F 80 Mild restriction, reduction in FVC compared to 2021  Labs: CTD serologies 03/29/2020 ANA, SSA, SSB, rheumatoid factor, Ro, La-negative  Assessment:  Interstitial lung disease Prior CT scan reviewed with nonspecific alternate pattern. Has remote asbestos exposure but CT scan is not typical for asbestosis. Given his age and frailty we had not recommended aggressive work-up or treatment at present  Now with worsening dyspnea after a bout of COVID-19 in  December.  The COVID-19 may have exacerbated underlying ILD or reactive airway disease has he has slight wheeze on examination We will give him prednisone 40 mg a day for 2 weeks and start AirDuo RespiClick generic as he cannot afford the bevespi Schedule high-resolution CT  Plan/Recommendations: Prednisone 40 mg a day for 2 weeks Airduo respiclick High-res CT  Chilton Greathouse MD [facility] 02/14/2021, 11:46 AM  CC: Johny Blamer, MD

## 2021-02-14 NOTE — Telephone Encounter (Signed)
Please call in Symbicort 160/4.5

## 2021-02-14 NOTE — Telephone Encounter (Signed)
Called and spoke with Patient. Patient had questions about what medications were sent to pharmacy. Patient was told by [facility] that Airduo was not preferred by his insurance. Patient stated pharmacy gave a list of preferred inhalers, but Patient was unable to give inhaler list. [facility] and spoke with Pharmacist. Madelaine Bhat is not covered by insurance. Preferred inhalers are Symbicort, Advair, Flovent, and Spiriva. Called Patient and told him I had his preferred inhaler list form pharmacy. Patient aware preferred inhaler list is being sent to Dr. Isaiah Serge and someone will call him back with another inhaler to try.  Message routed to Dr. Isaiah Serge to advise

## 2021-02-14 NOTE — Telephone Encounter (Signed)
Symbicort 160 prescription sent to [facility]. Air duo removed from patient medication list. Nothing further at this time.

## 2021-02-20 ENCOUNTER — Telehealth: Payer: Self-pay | Admitting: Pulmonary Disease

## 2021-02-20 NOTE — Telephone Encounter (Signed)
I called and spoke with patient daughter, [name], who is on DPR regarding cost of symbicort. Daughter stated the cost of Symbicort was $400 until deductible was made. I offered patient assistance and they agreed to try this so I have mailed paperwork to them and told them to return when they finish it. Dr. Isaiah Serge, daughter stated he was given Breztri samples from PCP and is almost out, can we give more samples to help until PAP is completed? Thanks!

## 2021-02-20 NOTE — Telephone Encounter (Signed)
Called pt's pharmacy and spoke with [facility] checking to see if they had the Symb Rx that was sent in after pt's last OV. Narda Rutherford stated that they did receive the Rx but due to pt's copay being $400 which will be until pt's deductible was met, they left the Rx on file and called the pt to try to tell him this. Stated to [place] that I would call pt's daughter to further discuss with her and she verbalized understanding.  Called pt's daughter Benjamine Mola but unable to reach. Left message for her to return call.

## 2021-02-20 NOTE — Telephone Encounter (Signed)
Pt's daughter returning a phone call. Pt's daughter can be reached at [phone].

## 2021-02-25 MED ORDER — BREZTRI AEROSPHERE 160-9-4.8 MCG/ACT IN AERO: 2.0000 | INHALATION_SPRAY | Freq: Two times a day (BID) | RESPIRATORY_TRACT | 0 refills | Status: AC

## 2021-02-25 NOTE — Telephone Encounter (Signed)
Looked at patient's formulary online. Symbicort is covered as a tier 3 medication. Generic Symbicort is not covered. Virgel Bouquet is also covered as a tier 3 medication.   Called and spoke with patient's daughter Justin Richmond. She is aware that I will leave samples of Breztri up front. She verbalized understanding of instructions.   Nothing further needed at time of call.

## 2021-02-25 NOTE — Addendum Note (Signed)
Addended by: Maurene Capes on: 02/25/2021 02:22 PM   Modules accepted: Orders

## 2021-02-25 NOTE — Telephone Encounter (Signed)
Ok to give [employer]. Can they find out from insurance which inhalers are preferred?

## 2021-02-27 ENCOUNTER — Ambulatory Visit (INDEPENDENT_AMBULATORY_CARE_PROVIDER_SITE_OTHER): Payer: Medicare Other | Admitting: Podiatry

## 2021-02-27 ENCOUNTER — Other Ambulatory Visit: Payer: Self-pay

## 2021-02-27 DIAGNOSIS — M79675 Pain in left toe(s): Secondary | ICD-10-CM

## 2021-02-27 DIAGNOSIS — B351 Tinea unguium: Secondary | ICD-10-CM | POA: Diagnosis not present

## 2021-02-27 DIAGNOSIS — M79674 Pain in right toe(s): Secondary | ICD-10-CM | POA: Diagnosis not present

## 2021-02-27 DIAGNOSIS — S90821A Blister (nonthermal), right foot, initial encounter: Secondary | ICD-10-CM

## 2021-02-27 DIAGNOSIS — G629 Polyneuropathy, unspecified: Secondary | ICD-10-CM | POA: Diagnosis not present

## 2021-02-28 ENCOUNTER — Encounter: Payer: Self-pay | Admitting: Podiatry

## 2021-02-28 NOTE — Progress Notes (Signed)
  Subjective:  Patient ID: Justin Richmond, male    DOB: 1924-09-06,  MRN: [id number]  Chief Complaint  Patient presents with  . debride    RFC -BL nails trimming    87 y.o. male returns for the above complaint.  Patient presents with thickened elongated dystrophic toenails x10.  Patient would like to have them debrided down as she is not able to do it himself.  He denies any other acute complaints.  He has secondary complaint of right dorsal right blister that he does not know how it happened.  There is no clinical signs of infection.  He has not done anything for it.  There has not been any fluid filled blister formation.  He would like to know what was going on if there is way to prevent this from happening.  He denies any other acute complaints.  Objective:  There were no vitals filed for this visit. Podiatric Exam: Vascular: dorsalis pedis and posterior tibial pulses are palpable bilateral. Capillary return is immediate. Temperature gradient is WNL. Skin turgor WNL  Sensorium: Normal Semmes Weinstein monofilament test. Normal tactile sensation bilaterally. Nail Exam: Pt has thick disfigured discolored nails with subungual debris noted bilateral entire nail hallux through fifth toenails.  Pain on palpation to the nails. Ulcer Exam: There is no evidence of ulcer or pre-ulcerative changes or infection. Orthopedic Exam: Muscle tone and strength are WNL. No limitations in general ROM. No crepitus or effusions noted. HAV  B/L.  Hammer toes 2-5  B/L. Skin: No Porokeratosis. No infection or ulcers.  Right dorsal foot dried blister without any clinical signs of infection no ulceration noted.  No bony abnormalities noted.    Assessment & Plan:   1. Friction blister of the foot, right, initial encounter   2. Pain due to onychomycosis of toenails of both feet   3. Neuropathy     Patient was evaluated and treated and all questions answered.  Right dorsal foot blister friction dried -I  explained the patient the etiology of friction blister versus but options were discussed.  At this time it is very dried and the underlying skin is completely epithelialized.  I discussed offloading with padding as well as possible shoe gear modification.  He states understanding and will immediately change his shoes as he seems to be putting aggressive pressure on the dorsal aspect of foot.  If there is no resolve male with trying conservative treatment options we can discuss other options during next cycle visit.  He states understanding  Onychomycosis with pain  -Nails palliatively debrided as below. -Educated on self-care  Procedure: Nail Debridement Rationale: pain  Type of Debridement: manual, sharp debridement. Instrumentation: Nail nipper, rotary burr. Number of Nails: 10  Procedures and Treatment: Consent by patient was obtained for treatment procedures. The patient understood the discussion of treatment and procedures well. All questions were answered thoroughly reviewed. Debridement of mycotic and hypertrophic toenails, 1 through 5 bilateral and clearing of subungual debris. No ulceration, no infection noted.  Return Visit-Office Procedure: Patient instructed to return to the office for a follow up visit 3 months for continued evaluation and treatment.  Nicholes Rough, DPM    No follow-ups on file.

## 2021-03-01 ENCOUNTER — Inpatient Hospital Stay: Admission: RE | Admit: 2021-03-01 | Payer: Medicare Other | Source: Ambulatory Visit

## 2021-03-05 LAB — PULMONARY FUNCTION TEST
FEF 25-75 Pre: 1.41 L/sec
FEF2575-%Pred-Pre: 154 %
FEV1-%Pred-Pre: 77 %
FEV1-Pre: 1.37 L
FEV1FVC-%Pred-Pre: 116 %
FEV6-%Pred-Pre: 69 %
FEV6-Pre: 1.71 L
FEV6FVC-%Pred-Pre: 110 %
FVC-%Pred-Pre: 62 %
Pre FEV1/FVC ratio: 80 %
Pre FEV6/FVC Ratio: 100 %

## 2021-03-10 ENCOUNTER — Emergency Department (HOSPITAL_COMMUNITY): Payer: Medicare Other

## 2021-03-10 ENCOUNTER — Encounter (HOSPITAL_COMMUNITY): Payer: Self-pay

## 2021-03-10 ENCOUNTER — Other Ambulatory Visit: Payer: Self-pay

## 2021-03-10 ENCOUNTER — Inpatient Hospital Stay (HOSPITAL_COMMUNITY)
Admission: EM | Admit: 2021-03-10 | Discharge: 2021-03-12 | DRG: 690 | Disposition: A | Discharge status: Home / Self Care | Payer: Medicare Other | Attending: Internal Medicine | Admitting: Internal Medicine

## 2021-03-10 DIAGNOSIS — M1990 Unspecified osteoarthritis, unspecified site: Secondary | ICD-10-CM | POA: Diagnosis present

## 2021-03-10 DIAGNOSIS — Z950 Presence of cardiac pacemaker: Secondary | ICD-10-CM

## 2021-03-10 DIAGNOSIS — G629 Polyneuropathy, unspecified: Secondary | ICD-10-CM | POA: Diagnosis present

## 2021-03-10 DIAGNOSIS — N3281 Overactive bladder: Secondary | ICD-10-CM | POA: Diagnosis present

## 2021-03-10 DIAGNOSIS — I110 Hypertensive heart disease with heart failure: Secondary | ICD-10-CM | POA: Diagnosis present

## 2021-03-10 DIAGNOSIS — J849 Interstitial pulmonary disease, unspecified: Secondary | ICD-10-CM | POA: Diagnosis present

## 2021-03-10 DIAGNOSIS — Z8679 Personal history of other diseases of the circulatory system: Secondary | ICD-10-CM

## 2021-03-10 DIAGNOSIS — G8929 Other chronic pain: Secondary | ICD-10-CM | POA: Diagnosis present

## 2021-03-10 DIAGNOSIS — Z66 Do not resuscitate: Secondary | ICD-10-CM | POA: Diagnosis present

## 2021-03-10 DIAGNOSIS — Z20822 Contact with and (suspected) exposure to covid-19: Secondary | ICD-10-CM | POA: Diagnosis present

## 2021-03-10 DIAGNOSIS — L89152 Pressure ulcer of sacral region, stage 2: Secondary | ICD-10-CM | POA: Diagnosis present

## 2021-03-10 DIAGNOSIS — Z7951 Long term (current) use of inhaled steroids: Secondary | ICD-10-CM | POA: Diagnosis not present

## 2021-03-10 DIAGNOSIS — M549 Dorsalgia, unspecified: Secondary | ICD-10-CM | POA: Diagnosis present

## 2021-03-10 DIAGNOSIS — Z7952 Long term (current) use of systemic steroids: Secondary | ICD-10-CM

## 2021-03-10 DIAGNOSIS — H409 Unspecified glaucoma: Secondary | ICD-10-CM | POA: Diagnosis present

## 2021-03-10 DIAGNOSIS — K5909 Other constipation: Secondary | ICD-10-CM | POA: Diagnosis present

## 2021-03-10 DIAGNOSIS — Z87440 Personal history of urinary (tract) infections: Secondary | ICD-10-CM | POA: Diagnosis not present

## 2021-03-10 DIAGNOSIS — I5032 Chronic diastolic (congestive) heart failure: Secondary | ICD-10-CM

## 2021-03-10 DIAGNOSIS — I10 Essential (primary) hypertension: Secondary | ICD-10-CM | POA: Diagnosis not present

## 2021-03-10 DIAGNOSIS — E7800 Pure hypercholesterolemia, unspecified: Secondary | ICD-10-CM | POA: Diagnosis present

## 2021-03-10 DIAGNOSIS — H9190 Unspecified hearing loss, unspecified ear: Secondary | ICD-10-CM | POA: Diagnosis present

## 2021-03-10 DIAGNOSIS — Z87891 Personal history of nicotine dependence: Secondary | ICD-10-CM

## 2021-03-10 DIAGNOSIS — N419 Inflammatory disease of prostate, unspecified: Secondary | ICD-10-CM | POA: Diagnosis present

## 2021-03-10 DIAGNOSIS — Z79899 Other long term (current) drug therapy: Secondary | ICD-10-CM

## 2021-03-10 DIAGNOSIS — L8990 Pressure ulcer of unspecified site, unspecified stage: Secondary | ICD-10-CM | POA: Insufficient documentation

## 2021-03-10 DIAGNOSIS — N390 Urinary tract infection, site not specified: Secondary | ICD-10-CM | POA: Diagnosis not present

## 2021-03-10 DIAGNOSIS — Z800 Family history of malignant neoplasm of digestive organs: Secondary | ICD-10-CM

## 2021-03-10 DIAGNOSIS — N3001 Acute cystitis with hematuria: Secondary | ICD-10-CM

## 2021-03-10 DIAGNOSIS — G2581 Restless legs syndrome: Secondary | ICD-10-CM | POA: Diagnosis present

## 2021-03-10 DIAGNOSIS — K219 Gastro-esophageal reflux disease without esophagitis: Secondary | ICD-10-CM

## 2021-03-10 DIAGNOSIS — Z880 Allergy status to penicillin: Secondary | ICD-10-CM

## 2021-03-10 LAB — CBC WITH DIFFERENTIAL/PLATELET
Abs Immature Granulocytes: 0.03 10*3/uL (ref 0.00–0.07)
Basophils Absolute: 0 10*3/uL (ref 0.0–0.1)
Basophils Relative: 0 %
Eosinophils Absolute: 0 10*3/uL (ref 0.0–0.5)
Eosinophils Relative: 0 %
HCT: 36.4 % — ABNORMAL LOW (ref 39.0–52.0)
Hemoglobin: 12.2 g/dL — ABNORMAL LOW (ref 13.0–17.0)
Immature Granulocytes: 0 %
Lymphocytes Relative: 5 %
Lymphs Abs: 0.4 10*3/uL — ABNORMAL LOW (ref 0.7–4.0)
MCH: 31 pg (ref 26.0–34.0)
MCHC: 33.5 g/dL (ref 30.0–36.0)
MCV: 92.6 fL (ref 80.0–100.0)
Monocytes Absolute: 0.1 10*3/uL (ref 0.1–1.0)
Monocytes Relative: 1 %
Neutro Abs: 7.9 10*3/uL — ABNORMAL HIGH (ref 1.7–7.7)
Neutrophils Relative %: 94 %
Platelets: 186 10*3/uL (ref 150–400)
RBC: 3.93 MIL/uL — ABNORMAL LOW (ref 4.22–5.81)
RDW: 13.8 % (ref 11.5–15.5)
WBC: 8.5 10*3/uL (ref 4.0–10.5)
nRBC: 0 % (ref 0.0–0.2)

## 2021-03-10 LAB — URINALYSIS, ROUTINE W REFLEX MICROSCOPIC
Bilirubin Urine: NEGATIVE
Glucose, UA: NEGATIVE mg/dL
Ketones, ur: NEGATIVE mg/dL
Nitrite: NEGATIVE
Protein, ur: 30 mg/dL — AB
RBC / HPF: >50 RBC/hpf — ABNORMAL HIGH (ref 0–5)
Specific Gravity, Urine: 1.008 (ref 1.005–1.030)
WBC, UA: >50 WBC/hpf — ABNORMAL HIGH (ref 0–5)
pH: 6 (ref 5.0–8.0)

## 2021-03-10 LAB — COMPREHENSIVE METABOLIC PANEL
ALT: 13 U/L (ref 0–44)
AST: 19 U/L (ref 15–41)
Albumin: 3.1 g/dL — ABNORMAL LOW (ref 3.5–5.0)
Alkaline Phosphatase: 51 U/L (ref 38–126)
Anion gap: 8 (ref 5–15)
BUN: 22 mg/dL (ref 8–23)
CO2: 20 mmol/L — ABNORMAL LOW (ref 22–32)
Calcium: 8.5 mg/dL — ABNORMAL LOW (ref 8.9–10.3)
Chloride: 108 mmol/L (ref 98–111)
Creatinine, Ser: 1.03 mg/dL (ref 0.61–1.24)
GFR, Estimated: >60 mL/min (ref >60)
Glucose, Bld: 85 mg/dL (ref 70–99)
Potassium: 4.2 mmol/L (ref 3.5–5.1)
Sodium: 136 mmol/L (ref 135–145)
Total Bilirubin: 0.6 mg/dL (ref 0.3–1.2)
Total Protein: 6.8 g/dL (ref 6.5–8.1)

## 2021-03-10 LAB — RESP PANEL BY RT-PCR (FLU A&B, COVID) ARPGX2
Influenza A by PCR: NEGATIVE
Influenza B by PCR: NEGATIVE
SARS Coronavirus 2 by RT PCR: NEGATIVE

## 2021-03-10 LAB — APTT: aPTT: 27 seconds (ref 24–36)

## 2021-03-10 LAB — LACTIC ACID, PLASMA: Lactic Acid, Venous: 1.8 mmol/L (ref 0.5–1.9)

## 2021-03-10 LAB — PROTIME-INR
INR: 1.1 (ref 0.8–1.2)
Prothrombin Time: 14.2 seconds (ref 11.4–15.2)

## 2021-03-10 MED ORDER — LACTATED RINGERS IV SOLN: INTRAVENOUS | Status: AC

## 2021-03-10 MED ORDER — ACETAMINOPHEN 650 MG RE SUPP: 650.0000 mg | Freq: Four times a day (QID) | RECTAL | Status: DC | PRN

## 2021-03-10 MED ORDER — ONDANSETRON HCL 4 MG/2ML IJ SOLN: 4.0000 mg | Freq: Four times a day (QID) | INTRAMUSCULAR | Status: DC | PRN

## 2021-03-10 MED ORDER — SODIUM CHLORIDE 0.9 % IV SOLN: INTRAVENOUS | Status: DC

## 2021-03-10 MED ORDER — LACTATED RINGERS IV BOLUS (SEPSIS)
1000.0000 mL | Freq: Once | INTRAVENOUS | Status: AC
Start: 2021-03-10 — End: 2021-03-10
  Administered 2021-03-10: 1000 mL via INTRAVENOUS

## 2021-03-10 MED ORDER — ACETAMINOPHEN 325 MG PO TABS: 650.0000 mg | ORAL_TABLET | Freq: Four times a day (QID) | ORAL | Status: DC | PRN

## 2021-03-10 MED ORDER — LEVOFLOXACIN IN D5W 750 MG/150ML IV SOLN
750.0000 mg | Freq: Once | INTRAVENOUS | Status: AC
  Administered 2021-03-10: 750 mg via INTRAVENOUS
  Filled 2021-03-10: qty 150

## 2021-03-10 MED ORDER — ONDANSETRON HCL 4 MG PO TABS: 4.0000 mg | ORAL_TABLET | Freq: Four times a day (QID) | ORAL | Status: DC | PRN

## 2021-03-10 MED ORDER — SODIUM CHLORIDE 0.9 % IV SOLN
1.0000 g | INTRAVENOUS | Status: DC
  Administered 2021-03-11 – 2021-03-12 (×2): 1 g via INTRAVENOUS
  Filled 2021-03-10 (×2): qty 1

## 2021-03-10 MED ORDER — ENSURE ENLIVE PO LIQD
237.0000 mL | Freq: Two times a day (BID) | ORAL | Status: DC
  Administered 2021-03-11 – 2021-03-12 (×3): 237 mL via ORAL

## 2021-03-10 MED ORDER — LACTATED RINGERS IV BOLUS (SEPSIS)
1000.0000 mL | Freq: Once | INTRAVENOUS | Status: AC
  Administered 2021-03-10: 1000 mL via INTRAVENOUS

## 2021-03-10 NOTE — ED Notes (Signed)
Pt departed the dept via stetcher and ED tech accompanying.

## 2021-03-10 NOTE — Progress Notes (Signed)
A consult was received from an ED physician for levaquin per pharmacy dosing.  The patient's profile has been reviewed for ht/wt/allergies/indication/available labs.    PCN allergy: unknown reaction, "it's been a long time".  No past abx seen in [facility].  On Bactrim PTA  A one time order has been placed for levaquin 750 IV x 1 by EDP   Further antibiotics/pharmacy consults should be ordered by admitting physician if indicated.                       Thank you, Herby Abraham, Pharm.D 03/10/2021 12:33 PM

## 2021-03-10 NOTE — ED Triage Notes (Signed)
Per EMS, pt presents to the ED for blood in urine. EMS states the pt is currently undergoing abx therapy for prostatitis and pain with urination. Pt additionally c/o chills.  Per EMS pt lives with his daughter. Per EMS< pt denying any n/v. Pt was hypertensive en route and hypoxic. Transported on [place]L [place]

## 2021-03-10 NOTE — ED Notes (Signed)
ED provider at the bedside.  

## 2021-03-10 NOTE — ED Notes (Signed)
Report given to floor nurse Grayland Jack, RN.

## 2021-03-10 NOTE — ED Notes (Signed)
Hospitalist at the bedside 

## 2021-03-10 NOTE — ED Notes (Signed)
Provider notified of pt's VS triggering sepsis.  

## 2021-03-10 NOTE — ED Provider Notes (Signed)
[facility]-EMERGENCY DEPT Provider Note   CSN: [id number] Arrival date & time: 03/10/21  1148     History Chief Complaint  Patient presents with  . Hematuria    Per EMS pt being treated for prostatitis    Justin Richmond is a 87 y.o. male.  87 year old male presents with hematuria x1 day with associated diffuse weakness.  No reported fever or emesis.  Denies any blood thinner use.  Has had some coughing with some dyspnea.  Denies any chest pain.  Patient recently diagnosed with prostatitis and is currently taking antibiotics.  EMS was called and patient found to have an oxygen requirement and transported here for further management        Past Medical History:  Diagnosis Date  . Arrhythmia   . Balance problems   . Chronic back pain   . Chronic constipation   . Congestive heart failure (HCC)   . Diarrhea   . Eczema   . Eye disease    cataracts/glaucoma  . GERD (gastroesophageal reflux disease)   . Hearing loss   . Hemorrhoids   . High cholesterol   . Hypertension   . Irregular heartbeat   . Joint pain   . Joint stiffness   . Migraine headache   . Mouth sores   . Muscle weakness   . OAB (overactive bladder)   . Osteoarthritis   . Osteomyelitis (HCC)    childhood, left leg  . Pacemaker   . Poor circulation   . Pure hypercholesterolemia   . Rectal bleeding   . Recurrent UTI   . Scoliosis   . Shortness of breath   . Skin disorder   . Spinal stenosis, lumbar region with neurogenic claudication     Patient Active Problem List   Diagnosis Date Noted  . Peripheral polyneuropathy 04/02/2020  . Spinal stenosis of lumbar region with neurogenic claudication 04/02/2020    Past Surgical History:  Procedure Laterality Date  . DENTAL SURGERY     tooth removal, root canals  . INSERT / REPLACE / REMOVE PACEMAKER    . osteomyelitis surgery     x10 surgeries  . SPINAL INJECTION     x3        Family History  Problem Relation Age of Onset   . Stroke Mother   . Other Father        "black lung disease"  . Cancer Sister   . Ankylosing spondylitis Brother   . Asthma Son     Social History   Tobacco Use  . Smoking status: Former Smoker    Packs/day: 0.25    Years: 2.00    Pack years: 0.50    Types: Cigarettes    Quit date: 05/16/1950    Years since quitting: 70.8  . Smokeless tobacco: Never Used  Vaping Use  . Vaping Use: Never used  Substance Use Topics  . Alcohol use: Never    Comment: in college  . Drug use: Never    Comment: no    Home Medications Prior to Admission medications   Medication Sig Start Date End Date Taking? Authorizing Provider  Ascorbic Acid (VITAMIN C) 500 MG CAPS See admin instructions.    [provider]  aspirin EC 81 MG tablet Take 81 mg by mouth daily.    [provider]  Budeson-Glycopyrrol-Formoterol (BREZTRI AEROSPHERE) 160-9-4.8 MCG/ACT AERO Inhale 2 puffs into the lungs 2 (two) times daily. 02/25/21   Chilton Greathouse, MD  budesonide-formoterol (SYMBICORT) 160-4.5 MCG/ACT  inhaler Inhale 2 puffs into the lungs 2 (two) times daily. 02/14/21   Mannam, Colbert Coyer, MD  carvedilol (COREG) 6.25 MG tablet Take 6.25 mg by mouth 2 (two) times daily with a meal.    [provider]  esomeprazole (NEXIUM) 40 MG capsule Take 20 mg by mouth daily. 06/12/20   [provider]  gabapentin (NEURONTIN) 100 MG capsule Take 100 mg by mouth daily.  08/14/20   [provider]  Garlic 500 MG CAPS See admin instructions.    [provider]  Magnesium Malate POWD See admin instructions.    [provider]  oxybutynin (DITROPAN) 5 MG tablet 1 tablet 02/04/21   [provider]  predniSONE (DELTASONE) 20 MG tablet Take 40mg  daily for 14 days 02/14/21   04/16/21, MD  rosuvastatin (CRESTOR) 10 MG tablet  03/05/20   [provider]  Turmeric 450 MG CAPS See admin instructions.    [provider]  VENTOLIN HFA 108 (90 Base) MCG/ACT  inhaler Inhale 2 puffs into the lungs every 4 (four) hours as needed.  07/18/20   [provider]  Vitamin A 7.5 MG (25000 UT) CAPS See admin instructions.    [provider]    Allergies    Penicillins  Review of Systems   Review of Systems  All other systems reviewed and are negative.   Physical Exam Updated Vital Signs Ht 1.676 m (5\' 6" )   Wt 62.1 kg   SpO2 (!) 85%   BMI 22.10 kg/m   Physical Exam Vitals and nursing note reviewed.  Constitutional:      General: He is not in acute distress.    Appearance: Normal appearance. He is well-developed. He is not toxic-appearing.  HENT:     Head: Normocephalic and atraumatic.  Eyes:     General: Lids are normal.     Conjunctiva/sclera: Conjunctivae normal.     Pupils: Pupils are equal, round, and reactive to light.  Neck:     Thyroid: No thyroid mass.     Trachea: No tracheal deviation.  Cardiovascular:     Rate and Rhythm: Normal rate and regular rhythm.     Heart sounds: Normal heart sounds. No murmur heard. No gallop.   Pulmonary:     Effort: Pulmonary effort is normal. No respiratory distress.     Breath sounds: Normal breath sounds. No stridor. No decreased breath sounds, wheezing, rhonchi or rales.  Abdominal:     General: Bowel sounds are normal. There is no distension.     Palpations: Abdomen is soft.     Tenderness: There is no abdominal tenderness. There is no rebound.  Musculoskeletal:        General: No tenderness. Normal range of motion.     Cervical back: Normal range of motion and neck supple.  Skin:    General: Skin is warm and dry.     Findings: No abrasion or rash.  Neurological:     General: No focal deficit present.     Mental Status: He is alert and oriented to person, place, and time.     GCS: GCS eye subscore is 4. GCS verbal subscore is 5. GCS motor subscore is 6.     Cranial Nerves: No cranial nerve deficit.     Sensory: No sensory deficit.  Psychiatric:        Attention and  Perception: Attention normal.        Mood and Affect: Affect is flat.  ED Results / Procedures / Treatments   Labs (all labs ordered are listed, but only abnormal results are displayed) Labs Reviewed  URINE CULTURE  CULTURE, BLOOD (ROUTINE X 2)  CULTURE, BLOOD (ROUTINE X 2)  RESP PANEL BY RT-PCR (FLU A&B, COVID) ARPGX2  URINALYSIS, ROUTINE W REFLEX MICROSCOPIC  CBC WITH DIFFERENTIAL/PLATELET  COMPREHENSIVE METABOLIC PANEL  LACTIC ACID, PLASMA    EKG EKG Interpretation  Date/Time:  Sunday Mar 10 2021 12:08:41 EDT Ventricular Rate:  113 PR Interval:  105 QRS Duration: 153 QT Interval:  378 QTC Calculation: 519 R Axis:   -84 Text Interpretation: Ventricular-paced complexes No further analysis attempted due to paced rhythm Confirmed by Lorre Nick ([id number]) on 03/10/2021 1:09:25 PM   Radiology No results found.  Procedures Procedures   Medications Ordered in ED Medications  0.9 %  sodium chloride infusion (has no administration in time range)    ED Course  I have reviewed the triage vital signs and the nursing notes.  Pertinent labs & imaging results that were available during my care of the patient were reviewed by me and considered in my medical decision making (see chart for details).    MDM Rules/Calculators/A&P                          Patient febrile here and tachycardic.  Code sepsis initiated due to altered middle status.  Given IV fluids and started on IV antibiotics.  Chest x-ray without acute findings.  Urinalysis consistent with infection.  Renal CT consistent with cystitis.  First lactate negative.  Will admit to the hospitalist service   CRITICAL CARE Performed by: Toy Baker Total critical care time: 50 minutes Critical care time was exclusive of separately billable procedures and treating other patients. Critical care was necessary to treat or prevent imminent or life-threatening deterioration. Critical care was time spent personally by  me on the following activities: development of treatment plan with patient and/or surrogate as well as nursing, discussions with consultants, evaluation of patient's response to treatment, examination of patient, obtaining history from patient or surrogate, ordering and performing treatments and interventions, ordering and review of laboratory studies, ordering and review of radiographic studies, pulse oximetry and re-evaluation of patient's condition.  Final Clinical Impression(s) / ED Diagnoses Final diagnoses:  None    Rx / DC Orders ED Discharge Orders    None       Lorre Nick, MD 03/10/21 1336

## 2021-03-10 NOTE — Progress Notes (Signed)
elink following sepsis 

## 2021-03-10 NOTE — ED Notes (Signed)
Pt in imaging at this time.

## 2021-03-10 NOTE — H&P (Signed)
History and Physical    Mclain Freer JGG:[id number] DOB: 01-02-1924 DOA: 03/10/2021  PCP: Johny Blamer, MD  Patient coming from: home  Chief Complaint: urinary frequency, weakness  HPI: Justin Richmond is a 87 y.o. male with medical history significant of ILD, CHB, HLD, overactive bladder. Presenting with dysuria and weakness. He reports that he's been having problems with overactive bladder for a month. However, over the past week, he's had more problems with dysuria and weakness. He's noted that he's going to the bathroom more frequently and only having a small amount come out. It's typically dark, smelly urine. He was seen by his PCP and started on medication for prostatitis 3 days ago. Over the last 24 hours, he's seen occasional redness in his urine and he's felt more weak. Family became concerned and brought his to the ED for help. He denies any other aggravating or alleviating symptoms.   ED Course: CT renal study was negative for stones, however it did show diffuse bladder thickening concerning for cystitis. He was started on levaquin and fluids. [facility] was called for admission.  Review of Systems:  Denies CP, dyspnea, palpitations, N/V/D, syncopal episodes. Reports fever today. Review of systems is otherwise negative for all not mentioned in HPI.   PMHx Past Medical History:  Diagnosis Date  . Arrhythmia   . Balance problems   . Chronic back pain   . Chronic constipation   . Congestive heart failure (HCC)   . Diarrhea   . Eczema   . Eye disease    cataracts/glaucoma  . GERD (gastroesophageal reflux disease)   . Hearing loss   . Hemorrhoids   . High cholesterol   . Hypertension   . Irregular heartbeat   . Joint pain   . Joint stiffness   . Migraine headache   . Mouth sores   . Muscle weakness   . OAB (overactive bladder)   . Osteoarthritis   . Osteomyelitis (HCC)    childhood, left leg  . Pacemaker   . Poor circulation   . Pure hypercholesterolemia   . Rectal  bleeding   . Recurrent UTI   . Scoliosis   . Shortness of breath   . Skin disorder   . Spinal stenosis, lumbar region with neurogenic claudication     PSHx Past Surgical History:  Procedure Laterality Date  . DENTAL SURGERY     tooth removal, root canals  . INSERT / REPLACE / REMOVE PACEMAKER    . osteomyelitis surgery     x10 surgeries  . SPINAL INJECTION     x3     SocHx  reports that he quit smoking about 70 years ago. His smoking use included cigarettes. He has a 0.50 pack-year smoking history. He has never used smokeless tobacco. He reports that he does not drink alcohol and does not use drugs.  Allergies  Allergen Reactions  . Penicillins     Unknown reaction, "it's been a long time".    FamHx Family History  Problem Relation Age of Onset  . Stroke Mother   . Other Father        "black lung disease"  . Cancer Sister   . Ankylosing spondylitis Brother   . Asthma Son     Prior to Admission medications   Medication Sig Start Date End Date Taking? Authorizing Provider  Ascorbic Acid (VITAMIN C) 500 MG CAPS See admin instructions.    [provider]  aspirin EC 81 MG tablet Take 81 mg by mouth  daily.    [provider]  Budeson-Glycopyrrol-Formoterol (BREZTRI AEROSPHERE) 160-9-4.8 MCG/ACT AERO Inhale 2 puffs into the lungs 2 (two) times daily. 02/25/21   Mannam, Colbert Coyer, MD  budesonide-formoterol (SYMBICORT) 160-4.5 MCG/ACT inhaler Inhale 2 puffs into the lungs 2 (two) times daily. 02/14/21   Mannam, Colbert Coyer, MD  carvedilol (COREG) 6.25 MG tablet Take 6.25 mg by mouth 2 (two) times daily with a meal.    [provider]  esomeprazole (NEXIUM) 40 MG capsule Take 20 mg by mouth daily. 06/12/20   [provider]  gabapentin (NEURONTIN) 100 MG capsule Take 100 mg by mouth daily.  08/14/20   [provider]  Garlic 500 MG CAPS See admin instructions.    [provider]  Magnesium Malate POWD See admin instructions.     [provider]  oxybutynin (DITROPAN) 5 MG tablet 1 tablet 02/04/21   [provider]  predniSONE (DELTASONE) 20 MG tablet Take 40mg  daily for 14 days 02/14/21   04/16/21, MD  rosuvastatin (CRESTOR) 10 MG tablet  03/05/20   [provider]  Turmeric 450 MG CAPS See admin instructions.    [provider]  VENTOLIN HFA 108 (90 Base) MCG/ACT inhaler Inhale 2 puffs into the lungs every 4 (four) hours as needed.  07/18/20   [provider]  Vitamin A 7.5 MG (25000 UT) CAPS See admin instructions.    [provider]    Physical Exam: Vitals:   03/10/21 1245 03/10/21 1306 03/10/21 1315 03/10/21 1329  BP: 117/65 125/74 122/69   Pulse: (!) 106 (!) 103 (!) 102 (!) 102  Resp: (!) 22 (!) 24 20 (!) 22  Temp:      TempSrc:      SpO2: 94% 94% 98% 96%  Weight:      Height:        General: 87 y.o. male resting in bed in NAD Eyes: PERRL, normal sclera ENMT: Nares patent w/o discharge, orophaynx clear, dentition normal, ears w/o discharge/lesions/ulcers Neck: Supple, trachea midline Cardiovascular: tachy, +S1, S2, no m/g/r, equal pulses throughout Respiratory: good air movement, soft crackles at bases, normal WOB GI: BS+, NDNT, no masses noted, no organomegaly noted MSK: No e/c/c Skin: No rashes, bruises, ulcerations noted Neuro: A&O x 3, no focal deficits Psyc: Appropriate interaction and affect, calm/cooperative  Labs on Admission: I have personally reviewed following labs and imaging studies  CBC: Recent Labs  Lab 03/10/21 1232  WBC 8.5  NEUTROABS 7.9*  HGB 12.2*  HCT 36.4*  MCV 92.6  PLT 186   Basic Metabolic Panel: Recent Labs  Lab 03/10/21 1232  NA 136  K 4.2  CL 108  CO2 20*  GLUCOSE 85  BUN 22  CREATININE 1.03  CALCIUM 8.5*   GFR: Estimated Creatinine Clearance: 38.5 mL/min (by C-G formula based on SCr of 1.03 mg/dL). Liver Function Tests: Recent Labs  Lab 03/10/21 1232  AST 19  ALT 13  ALKPHOS 51   BILITOT 0.6  PROT 6.8  ALBUMIN 3.1*   No results for input(s): LIPASE, AMYLASE in the last 168 hours. No results for input(s): AMMONIA in the last 168 hours. Coagulation Profile: Recent Labs  Lab 03/10/21 1232  INR 1.1   Cardiac Enzymes: No results for input(s): CKTOTAL, CKMB, CKMBINDEX, TROPONINI in the last 168 hours. BNP (last 3 results) No results for input(s): PROBNP in the last 8760 hours. HbA1C: No results for input(s): HGBA1C in the last 72 hours. CBG: No results for input(s): GLUCAP in the  last 168 hours. Lipid Profile: No results for input(s): CHOL, HDL, LDLCALC, TRIG, CHOLHDL, LDLDIRECT in the last 72 hours. Thyroid Function Tests: No results for input(s): TSH, T4TOTAL, FREET4, T3FREE, THYROIDAB in the last 72 hours. Anemia Panel: No results for input(s): VITAMINB12, FOLATE, FERRITIN, TIBC, IRON, RETICCTPCT in the last 72 hours. Urine analysis:    Component Value Date/Time   COLORURINE YELLOW 03/10/2021 1230   APPEARANCEUR HAZY (A) 03/10/2021 1230   LABSPEC 1.008 03/10/2021 1230   PHURINE 6.0 03/10/2021 1230   GLUCOSEU NEGATIVE 03/10/2021 1230   HGBUR LARGE (A) 03/10/2021 1230   BILIRUBINUR NEGATIVE 03/10/2021 1230   KETONESUR NEGATIVE 03/10/2021 1230   PROTEINUR 30 (A) 03/10/2021 1230   NITRITE NEGATIVE 03/10/2021 1230   LEUKOCYTESUR LARGE (A) 03/10/2021 1230    Radiological Exams on Admission: DG Chest Port 1 View  Result Date: 03/10/2021 CLINICAL DATA:  Shortness of breath EXAM: PORTABLE CHEST 1 VIEW COMPARISON:  CT chest 07/20/2020 FINDINGS: Chronic bilateral interstitial thickening. No focal consolidation. No pleural effusion or pneumothorax. Heart and mediastinal contours are unremarkable. Dual lead cardiac pacemaker. No acute osseous abnormality. IMPRESSION: No acute cardiopulmonary disease. Bilateral chronic interstitial lung disease. Electronically Signed   By: Elige KoHetal  Patel   On: 03/10/2021 13:03   CT Renal Stone Study  Result Date:  03/10/2021 CLINICAL DATA:  Hematuria.  Undergoing treatment for prostatitis. EXAM: CT ABDOMEN AND PELVIS WITHOUT CONTRAST TECHNIQUE: Multidetector CT imaging of the abdomen and pelvis was performed following the standard protocol without IV contrast. COMPARISON:  None. FINDINGS: Lower chest: Chronic interstitial lung disease at the lung bases with fibrosis. Hepatobiliary: No focal liver abnormality is seen. No gallstones, gallbladder wall thickening, or biliary dilatation. Pancreas: Unremarkable. No pancreatic ductal dilatation or surrounding inflammatory changes. Spleen: Normal in size without focal abnormality. Adrenals/Urinary Tract: Normal adrenal glands. 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 No other urolithiasis. No obstructive uropathy. Diffuse bladder wall thickening with haziness in the surrounding fat most concerning for cystitis. Stomach/Bowel: Stomach is within normal limits. Appendix appears normal. No evidence of bowel wall thickening, distention, or inflammatory changes. Large amount of stool in the ascending colon. Vascular/Lymphatic: Normal caliber abdominal aorta with mild atherosclerosis. No lymphadenopathy. Reproductive: Prostate is unremarkable. Other: Left inguinal hernia containing fat.  No ascites. Musculoskeletal: No acute osseous abnormality. No aggressive osseous lesion. Moderate osteoarthritis of bilateral hips. Diffuse lumbar spine spondylosis. IMPRESSION: 1. Diffuse bladder wall thickening with haziness in the surrounding fat most concerning for cystitis. 2. Left fat containing inguinal hernia. 3. Chronic interstitial lung disease. 4.  Aortic Atherosclerosis (ICD10-I70.0). 5. 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 Electronically Signed   By: Elige KoHetal  Patel   On: 03/10/2021 13:29    EKG: Independently reviewed. V-paced  rhythm  Assessment/Plan UTI Hematuria SIRS     - admit to inpt, tele     - qsofa is 1     - awaiting UCx; blood Cx also collected, follow     - fluid boluses PRN     - given lvq 750 in ED; change to rocephin (I've discussed the RvB of using cephalosporins over fluoroquinolones in his case knowing that he has a PCN allergy). Given his renal function, lvq dose given is a little high (he would be 500 EOD); can start 1g rocephin tomorrow, follow closely      - CT renal study w/o mass,  it does show diffuse cystitis; urine at the bedside is not grossly bloody; follow up outpt urology  ILD     - continue home inhalers, steroids     - clinical exam is good and he appears comfortable on room air, speaking in full sentences w/o distress  Hx of complete heart block     - s/p PPM     - continue home meds  Hx of overactive bladder     - continue home ditropan  GERD     -  PPI  Peripheral polyneuropathy     - continue home gabapentin  Chronic constipation     - colace, PRN miralax  DVT prophylaxis: SCDs  Code Status: DNR, confirmed with pt  Family Communication: w/ son at bedside  Consults called: none  Status is: Inpatient  Remains inpatient appropriate because:Inpatient level of care appropriate due to severity of illness   Dispo: The patient is from: Home              Anticipated d/c is to: Home              Patient currently is not medically stable to d/c.   Difficult to place patient No  Time spent coordinating admission: 50 minutes  Jury Caserta A Jaysha Lasure DO [facility]  If 7PM-7AM, please contact night-coverage [email or web address]  03/10/2021, 1:40 PM

## 2021-03-11 DIAGNOSIS — I5032 Chronic diastolic (congestive) heart failure: Secondary | ICD-10-CM

## 2021-03-11 DIAGNOSIS — G2581 Restless legs syndrome: Secondary | ICD-10-CM

## 2021-03-11 DIAGNOSIS — K219 Gastro-esophageal reflux disease without esophagitis: Secondary | ICD-10-CM

## 2021-03-11 DIAGNOSIS — L8990 Pressure ulcer of unspecified site, unspecified stage: Secondary | ICD-10-CM | POA: Insufficient documentation

## 2021-03-11 DIAGNOSIS — J849 Interstitial pulmonary disease, unspecified: Secondary | ICD-10-CM

## 2021-03-11 DIAGNOSIS — Z8679 Personal history of other diseases of the circulatory system: Secondary | ICD-10-CM

## 2021-03-11 DIAGNOSIS — I10 Essential (primary) hypertension: Secondary | ICD-10-CM

## 2021-03-11 LAB — URINE CULTURE: Culture: <10000 — AB

## 2021-03-11 LAB — COMPREHENSIVE METABOLIC PANEL
ALT: 13 U/L (ref 0–44)
AST: 22 U/L (ref 15–41)
Albumin: 2.6 g/dL — ABNORMAL LOW (ref 3.5–5.0)
Alkaline Phosphatase: 38 U/L (ref 38–126)
Anion gap: 8 (ref 5–15)
BUN: 18 mg/dL (ref 8–23)
CO2: 25 mmol/L (ref 22–32)
Calcium: 8.5 mg/dL — ABNORMAL LOW (ref 8.9–10.3)
Chloride: 108 mmol/L (ref 98–111)
Creatinine, Ser: 0.91 mg/dL (ref 0.61–1.24)
GFR, Estimated: >60 mL/min (ref >60)
Glucose, Bld: 95 mg/dL (ref 70–99)
Potassium: 4 mmol/L (ref 3.5–5.1)
Sodium: 141 mmol/L (ref 135–145)
Total Bilirubin: 0.4 mg/dL (ref 0.3–1.2)
Total Protein: 6 g/dL — ABNORMAL LOW (ref 6.5–8.1)

## 2021-03-11 LAB — CBC
HCT: 33.9 % — ABNORMAL LOW (ref 39.0–52.0)
Hemoglobin: 11 g/dL — ABNORMAL LOW (ref 13.0–17.0)
MCH: 30.9 pg (ref 26.0–34.0)
MCHC: 32.4 g/dL (ref 30.0–36.0)
MCV: 95.2 fL (ref 80.0–100.0)
Platelets: 161 10*3/uL (ref 150–400)
RBC: 3.56 MIL/uL — ABNORMAL LOW (ref 4.22–5.81)
RDW: 14.2 % (ref 11.5–15.5)
WBC: 16.5 10*3/uL — ABNORMAL HIGH (ref 4.0–10.5)
nRBC: 0 % (ref 0.0–0.2)

## 2021-03-11 MED ORDER — GABAPENTIN 100 MG PO CAPS
100.0000 mg | ORAL_CAPSULE | Freq: Every day | ORAL | Status: DC
  Administered 2021-03-11: 100 mg via ORAL
  Filled 2021-03-11: qty 1

## 2021-03-11 MED ORDER — CARVEDILOL 6.25 MG PO TABS
6.2500 mg | ORAL_TABLET | Freq: Two times a day (BID) | ORAL | Status: DC
  Administered 2021-03-11 – 2021-03-12 (×2): 6.25 mg via ORAL
  Filled 2021-03-11 (×2): qty 1

## 2021-03-11 MED ORDER — ASPIRIN EC 81 MG PO TBEC
81.0000 mg | DELAYED_RELEASE_TABLET | Freq: Every day | ORAL | Status: DC
  Administered 2021-03-11 – 2021-03-12 (×2): 81 mg via ORAL
  Filled 2021-03-11 (×2): qty 1

## 2021-03-11 MED ORDER — ROPINIROLE HCL 0.25 MG PO TABS
0.2500 mg | ORAL_TABLET | Freq: Every day | ORAL | Status: DC
  Administered 2021-03-11: 0.25 mg via ORAL
  Filled 2021-03-11: qty 1

## 2021-03-11 MED ORDER — ADULT MULTIVITAMIN W/MINERALS CH
1.0000 | ORAL_TABLET | Freq: Every day | ORAL | Status: DC
  Administered 2021-03-11 – 2021-03-12 (×2): 1 via ORAL
  Filled 2021-03-11 (×2): qty 1

## 2021-03-11 MED ORDER — LABETALOL HCL 5 MG/ML IV SOLN
10.0000 mg | INTRAVENOUS | Status: DC | PRN
  Administered 2021-03-11: 10 mg via INTRAVENOUS
  Filled 2021-03-11: qty 4

## 2021-03-11 MED ORDER — PANTOPRAZOLE SODIUM 40 MG PO TBEC
40.0000 mg | DELAYED_RELEASE_TABLET | Freq: Every day | ORAL | Status: DC
  Administered 2021-03-11 – 2021-03-12 (×2): 40 mg via ORAL
  Filled 2021-03-11 (×2): qty 1

## 2021-03-11 MED ORDER — CARVEDILOL 6.25 MG PO TABS: 6.2500 mg | ORAL_TABLET | Freq: Two times a day (BID) | ORAL | Status: DC

## 2021-03-11 MED ORDER — MAGNESIUM OXIDE -MG SUPPLEMENT 400 (240 MG) MG PO TABS
400.0000 mg | ORAL_TABLET | Freq: Every day | ORAL | Status: DC
  Administered 2021-03-11 – 2021-03-12 (×2): 400 mg via ORAL
  Filled 2021-03-11 (×2): qty 1

## 2021-03-11 MED ORDER — HYDRALAZINE HCL 25 MG PO TABS: 25.0000 mg | ORAL_TABLET | ORAL | Status: DC | PRN

## 2021-03-11 NOTE — Plan of Care (Signed)
  Problem: Education: Goal: Knowledge of General Education information will improve Description: Including pain rating scale, medication(s)/side effects and non-pharmacologic comfort measures Outcome: Progressing   Problem: Health Behavior/Discharge Planning: Goal: Ability to manage health-related needs will improve Outcome: Progressing   Problem: Clinical Measurements: Goal: Will remain free from infection Outcome: Progressing Goal: Diagnostic test results will improve Outcome: Progressing   Problem: Activity: Goal: Risk for activity intolerance will decrease Outcome: Progressing   Problem: Elimination: Goal: Will not experience complications related to bowel motility Outcome: Progressing Goal: Will not experience complications related to urinary retention Outcome: Progressing   Problem: Safety: Goal: Ability to remain free from injury will improve Outcome: Progressing   Problem: Skin Integrity: Goal: Risk for impaired skin integrity will decrease Outcome: Progressing

## 2021-03-11 NOTE — Hospital Course (Addendum)
Justin Richmond is a 87 yo male with PMH recurrent UTI, osteomyelitis, osteoarthritis, OAB, ILD, hypertension, hyperlipidemia, GERD, dCHF (EF 73%, Gr 1 DD on 07/02/18 echo) who presented to the hospital with complaints of increased urinary frequency and weakness.  He also noticed some discomfort with urinating as well as change in his urine color to darker and smelling foul.  He was seen by his PCP outpatient and started on Bactrim 3 days prior to admission.  Due to not feeling better, he presented to the ER. CT renal stone study was done on admission and showed diffuse bladder wall thickening concerning for cystitis.  No nephrolithiasis identified.  He was given 1 dose of Levaquin in the ER and admitted and continued on Rocephin.  He has a previous penicillin allergy but unknown reaction that he cannot recall.  He tolerated his dose of Rocephin well on 03/11/2021. His urine culture did not grow prior to discharge.  He was continued on 2 more days of cefuroxime which would be a total of 5-day course of antibiotics.  His urine color had improved prior to discharge returning more clear in color.  He also endorsed improved urinary symptoms overall but was still endorsing some increased frequency prior to discharge as well as some sediment noticed in his urine. Would recommend repeat UA at outpatient follow-up.  He had also endorsed seeing blood-tinged urine at home prior to admission, but there was no further gross hematuria during hospitalization.  Of note, urinalysis on admission did have greater than 50 RBC.

## 2021-03-11 NOTE — Assessment & Plan Note (Addendum)
-   Patient symptomatic with increased frequency, dysuria, foul smell, and color change; even reported subtle hematuria - patient on bactrim prior to admission as well (approx 2-3 days per patient) - UA consistent with UTI (Lrg LE, neg nitrite, >50 WBC, rare bacteria). Lrg Hgb, >50 RBC also noted - Follow-up urine culture (<10k colonies); possibly from abx prior to admission - Received Levaquin x1 in ER and started on Rocephin during hospitalization.  Tolerated Rocephin dose on 03/11/2021 despite unknown reaction to penicillin allergy -Continue Rocephin while in hospital - d/c with cefuroxime for 2 more days (5 days total course length) not including bactrim use prior to admission - repeat UA at follow to re-assess hematuria

## 2021-03-11 NOTE — Assessment & Plan Note (Addendum)
-   No signs or symptoms of exacerbation - Previous echo reviewed from 06/22/2018: 73% EF, grade 1 diastolic dysfunction - Continue aspirin and Coreg

## 2021-03-11 NOTE — Assessment & Plan Note (Signed)
-   Continue multivitamin and Requip

## 2021-03-11 NOTE — Assessment & Plan Note (Addendum)
-   Pacemaker in place and functioning well at last device check (01/30/21)

## 2021-03-11 NOTE — Discharge Instructions (Signed)
Nutrition Post Hospital Stay Proper nutrition can help your body recover from illness and injury.   Foods and beverages high in protein, vitamins, and minerals help rebuild muscle loss, promote healing, & reduce fall risk.   .In addition to eating healthy foods, a nutrition shake is an easy, delicious way to get the nutrition you need during and after your hospital stay  It is recommended that you continue to drink 2 bottles per day of: Ensure or Boost for meal replacement.  Tips for adding a nutrition shake into your routine: As allowed, drink one with vitamins or medications instead of water or juice Enjoy one as a tasty mid-morning or afternoon snack Drink cold or make a milkshake out of it Drink one instead of milk with cereal or snacks Use as a coffee creamer   Available at the following grocery stores and pharmacies:           * Karin Golden * [employer] * [employer]co  * Rite Aid          * Walmart * [employer] Club  * Walgreens      * Target  * BJ's   * CVS  * [employer]   * [facility] [phone]            For COUPONS visit: www.ensure.com/join or RoleLink.com.br   Suggested Substitutions Ensure Plus = Boost Plus = Carnation Breakfast Essentials = Boost Compact Ensure Active Clear = Boost Breeze Glucerna Shake = Boost Glucose Control = Carnation Breakfast Essentials SUGAR FREE  Suggestions For Increasing Calories And Protein . Several small meals a day are easier to eat and digest than three large ones. Space meals about 2 to 3 hours apart to maximize comfort. . Stop eating 2 to 3 hours before bed and sleep with your head elevated if gastric reflux (GERD) and heartburn are problems. . Do not eat your favorite foods if you are feeling bad. Save them for when you feel good! . Eat breakfast-type foods at any meal. Eggs are usually easy to eat and are great any time of the day. (The same goes for pancakes and waffles.) . Eat when you feel hungry. Most  people have the greatest appetite in the morning because they have not eaten all night. If this is the best meal for you, then pile on those calories and other nutrients in the morning and at lunch. Then you can have a smaller dinner without losing total calories for the day. . Eat leftovers or nutritious snacks in the afternoon and early evening to round out your day. . Try homemade or commercially prepared nutrition bars and puddings, as well as calorie- and protein-rich liquid nutritional supplements. Benefits of Physical Activity Talk to your doctor about physical activity. Light or moderate physical activity can help maintain muscle and promote an appetite. Walking in the neighborhood or the local mall is a great way to get up, get out, and get moving. If you are unsteady on your feet, try walking around the dining room table. Save Room for [employer]! Drink most fluids between meals instead of with meals. (It is fine to have a sip to help swallow food at meal time.) Fluids (which usually have fewer calories and nutrients than solid food) can take up valuable space in your stomach.  Foods Recommended High-Protein Foods Milk products Add cheese to toast, crackers, sandwiches, baked potatoes, vegetables, soups, noodles, meat, and fruit. Use reduced-fat (2%) or whole milk in place of water  when cooking cereal and cream soups. Include cream sauces on vegetables and pasta. Add powdered milk to cream soups and mashed potatoes.  Eggs Have hard-cooked eggs readily available in the refrigerator. Chop and add to salads, casseroles, soups, and vegetables. Make a quick egg salad. All eggs should be well cooked to avoid the risk of harmful bacteria.  Meats, poultry, and fish Add leftover cooked meats to soups, casseroles, salads, and omelets. Make dip by mixing diced, chopped, or shredded meat with sour cream and spices.  Beans, legumes, nuts, and seeds Sprinkle nuts and seeds on cereals, fruit, and  desserts such as ice cream, pudding, and custard. Also serve nuts and seeds on vegetables, salads, and pasta. Spread peanut butter on toast, bread, English muffins, and fruit, or blend it in a milk shake. Add beans and peas to salads, soups, casseroles, and vegetable dishes.  High-Calorie Foods Butter, margarine, and  oils Melt butter or margarine over potatoes, rice, pasta, and cooked vegetables. Add melted butter or margarine into soups and casseroles and spread on bread for sandwiches before spreading sandwich spread or peanut butter. Saut or stir-fry vegetables, meats, chicken and fish such as shrimp/scallops in olive or canola oil. A variety of oils add calories and can be used to [employer], chicken, or fish.  Milk products Add whipping cream to desserts, pancakes, waffles, fruit, and hot chocolate, and fold it into soups and casseroles. Add sour cream to baked potatoes and vegetables.  Salad dressing Use regular (not low-fat or diet) mayonnaise and salad dressing on sandwiches and in dips with vegetables and fruit.   Sweets Add jelly and honey to bread and crackers. Add jam to fruit and ice cream and as a topping over cake.   Copyright 2020  Academy of [employer]. All rights reserved.

## 2021-03-11 NOTE — Progress Notes (Signed)
Progress Note    Denese KillingsJoseph Edgar   ZOX:[id number]  DOB: 08/09/1924  DOA: 03/10/2021     1  PCP: Johny BlamerHarris, William, MD  CC: Urinary symptoms  Hospital Course: Mr. Justin Richmond is a 87 yo male with PMH recurrent UTI, osteomyelitis, osteoarthritis, OAB, ILD, hypertension, hyperlipidemia, GERD, dCHF (EF 73%, Gr 1 DD on 07/02/18 echo) who presented to the hospital with complaints of increased urinary frequency and weakness.  He also noticed some discomfort with urinating as well as change in his urine color to darker and smelling foul.  He was seen by his PCP outpatient and started on Bactrim 3 days prior to admission.  Due to not feeling better, he presented to the ER. CT renal stone study was done on admission and showed diffuse bladder wall thickening concerning for cystitis.  No nephrolithiasis identified.  He was given 1 dose of Levaquin in the ER and admitted and continued on Rocephin.  He has a previous penicillin allergy but unknown reaction that he cannot recall.  He tolerated his dose of Rocephin well on 03/11/2021.   Interval History:  Seen this morning resting in his room.  Already states that his urinary symptoms feel improved some.  He has been voiding well, eating well, and denies any fevers, chills.  We reviewed use of Rocephin; he again did not recall reaction to penicillin from years ago.  Tentative plan is for discharging home tomorrow if continuing to feel better and improved.  ROS: Constitutional: negative for chills and fevers, Respiratory: negative for cough, Cardiovascular: negative for chest pain and Gastrointestinal: negative for abdominal pain  Assessment & Plan: * UTI (urinary tract infection) - Patient symptomatic with increased frequency, dysuria, foul smell, and color change - UA consistent with UTI - Follow-up urine culture - Received Levaquin x1 in ER and started on Rocephin during hospitalization.  Tolerated Rocephin dose on 03/11/2021 despite unknown reaction to  penicillin allergy -Continue Rocephin and will de-escalate to oral pending sensitivities if culture grows (was on Bactrim prior to admission)  History of complete heart block - Pacemaker in place and functioning well at last device check (01/30/21)  ILD (interstitial lung disease) (HCC) - no signs of flare - continue home inhalers  RLS (restless legs syndrome) - Continue multivitamin and Requip  GERD (gastroesophageal reflux disease) - Continue PPI  Chronic diastolic CHF (congestive heart failure) (HCC) - No signs or symptoms of exacerbation - Previous echo reviewed from 06/22/2018: 73% EF, grade 1 diastolic dysfunction - Continue aspirin and Coreg  Essential hypertension - Resume home meds (Coreg) - Blood pressure trending up this afternoon likely from having not resumed home meds yet - Use labetalol or hydralazine as needed as well    Old records reviewed in assessment of this patient  Antimicrobials: Levaquin 03/10/2021 x 1 Rocephin 03/11/2021 >> current  DVT prophylaxis: SCDs Start: 03/10/21 1544   Code Status:   Code Status: DNR Family Communication:   Disposition Plan: Status is: Inpatient  Remains inpatient appropriate because:IV treatments appropriate due to intensity of illness or inability to take PO and Inpatient level of care appropriate due to severity of illness   Dispo: The patient is from: Home              Anticipated d/c is to: Home              Patient currently is not medically stable to d/c.   Difficult to place patient No  Risk of unplanned readmission score: Unplanned Admission- Pilot do  not use: 11.72   Objective: Blood pressure (!) 172/95, pulse 85, temperature 98.7 F (37.1 C), temperature source Oral, resp. rate 16, height 5\' 6"  (1.676 m), weight 62.1 kg, SpO2 94 %.  Examination: General appearance: alert, cooperative and no distress Head: Normocephalic, without obvious abnormality, atraumatic Eyes: EOMI Lungs: clear to auscultation  bilaterally Heart: regular rate and rhythm and S1, S2 normal Abdomen: soft, NT, ND, BS present Extremities: no edema Skin: mobility and turgor normal Neurologic: Grossly normal  Consultants:   n/a  Procedures:   n/a  Data Reviewed: I have personally reviewed following labs and imaging studies Results for orders placed or performed during the hospital encounter of 03/10/21 (from the past 24 hour(s))  CBC     Status: Abnormal   Collection Time: 03/11/21  3:42 AM  Result Value Ref Range   WBC 16.5 (H) 4.0 - 10.5 K/uL   RBC 3.56 (L) 4.22 - 5.81 MIL/uL   Hemoglobin 11.0 (L) 13.0 - 17.0 g/dL   HCT 05/11/21 (L) 16.1 - 09.6 %   MCV 95.2 80.0 - 100.0 fL   MCH 30.9 26.0 - 34.0 pg   MCHC 32.4 30.0 - 36.0 g/dL   RDW 04.5 40.9 - 81.1 %   Platelets 161 150 - 400 K/uL   nRBC 0.0 0.0 - 0.2 %  Comprehensive metabolic panel     Status: Abnormal   Collection Time: 03/11/21  3:42 AM  Result Value Ref Range   Sodium 141 135 - 145 mmol/L   Potassium 4.0 3.5 - 5.1 mmol/L   Chloride 108 98 - 111 mmol/L   CO2 25 22 - 32 mmol/L   Glucose, Bld 95 70 - 99 mg/dL   BUN 18 8 - 23 mg/dL   Creatinine, Ser 05/11/21 0.61 - 1.24 mg/dL   Calcium 8.5 (L) 8.9 - 10.3 mg/dL   Total Protein 6.0 (L) 6.5 - 8.1 g/dL   Albumin 2.6 (L) 3.5 - 5.0 g/dL   AST 22 15 - 41 U/L   ALT 13 0 - 44 U/L   Alkaline Phosphatase 38 38 - 126 U/L   Total Bilirubin 0.4 0.3 - 1.2 mg/dL   GFR, Estimated 7.82 >95 mL/min   Anion gap 8 5 - 15    Recent Results (from the past 240 hour(s))  Culture, blood (Routine X 2) w Reflex to ID Panel     Status: None (Preliminary result)   Collection Time: 03/10/21 12:30 PM   Specimen: BLOOD  Result Value Ref Range Status   Specimen Description   Final    BLOOD LEFT ANTECUBITAL Performed at [facility], [address], [place], [place]    Special Requests   Final    BOTTLES DRAWN AEROBIC AND ANAEROBIC Blood Culture results may not be optimal due to an inadequate volume  of blood received in culture bottles Performed at [facility], [address] [place], [place]    Culture   Final    NO GROWTH < 24 HOURS Performed at [facility], [address], [place], [place]    Report Status PENDING  Incomplete  Resp Panel by RT-PCR (Flu A&B, Covid) Nasopharyngeal Swab     Status: None   Collection Time: 03/10/21 12:30 PM   Specimen: Nasopharyngeal Swab; Nasopharyngeal(NP) swabs in vial transport medium  Result Value Ref Range Status   SARS Coronavirus 2 by RT PCR NEGATIVE NEGATIVE Final    Comment: (NOTE) SARS-CoV-2 target nucleic  acids are NOT DETECTED.  The SARS-CoV-2 RNA is generally detectable in upper respiratory specimens during the acute phase of infection. The lowest concentration of SARS-CoV-2 viral copies this assay can detect is 138 copies/mL. A negative result does not preclude SARS-Cov-2 infection and should not be used as the sole basis for treatment or other patient management decisions. A negative result may occur with  improper specimen collection/handling, submission of specimen other than nasopharyngeal swab, presence of viral mutation(s) within the areas targeted by this assay, and inadequate number of viral copies(<138 copies/mL). A negative result must be combined with clinical observations, patient history, and epidemiological information. The expected result is Negative.  Fact Sheet for Patients:  BloggerCourse.com  Fact Sheet for Healthcare Providers:  SeriousBroker.it  This test is no t yet approved or cleared by the [place] FDA and  has been authorized for detection and/or diagnosis of SARS-CoV-2 by FDA under an Emergency Use Authorization (EUA). This EUA will remain  in effect (meaning this test can be used) for the duration of the COVID-19 declaration under Section 564(b)(1) of the Act, 21 U.S.C.section 360bbb-3(b)(1),  unless the authorization is terminated  or revoked sooner.       Influenza A by PCR NEGATIVE NEGATIVE Final   Influenza B by PCR NEGATIVE NEGATIVE Final    Comment: (NOTE) The Xpert Xpress SARS-CoV-2/FLU/RSV plus assay is intended as an aid in the diagnosis of influenza from Nasopharyngeal swab specimens and should not be used as a sole basis for treatment. Nasal washings and aspirates are unacceptable for Xpert Xpress SARS-CoV-2/FLU/RSV testing.  Fact Sheet for Patients: BloggerCourse.com  Fact Sheet for Healthcare Providers: SeriousBroker.it  This test is not yet approved or cleared by the [place] FDA and has been authorized for detection and/or diagnosis of SARS-CoV-2 by FDA under an Emergency Use Authorization (EUA). This EUA will remain in effect (meaning this test can be used) for the duration of the COVID-19 declaration under Section 564(b)(1) of the Act, 21 U.S.C. section 360bbb-3(b)(1), unless the authorization is terminated or revoked.  Performed at [facility], [address], [place], [place] [address]   Culture, blood (Routine X 2) w Reflex to ID Panel     Status: None (Preliminary result)   Collection Time: 03/10/21 12:34 PM   Specimen: BLOOD  Result Value Ref Range Status   Specimen Description   Final    BLOOD RIGHT ANTECUBITAL Performed at [facility], [address] [place], [place] [address]    Special Requests   Final    BOTTLES DRAWN AEROBIC AND ANAEROBIC Blood Culture results may not be optimal due to an inadequate volume of blood received in culture bottles Performed at [facility], [address], [place], [place] [address]    Culture   Final    NO GROWTH < 24 HOURS Performed at [facility], [address] [place], [place] [address]    Report Status PENDING  Incomplete     Radiology Studies: DG Chest Port 1  View  Result Date: 03/10/2021 CLINICAL DATA:  Shortness of breath EXAM: PORTABLE CHEST 1 VIEW COMPARISON:  CT chest 07/20/2020 FINDINGS: Chronic bilateral interstitial thickening. No focal consolidation. No pleural effusion or pneumothorax. Heart and mediastinal contours are unremarkable. Dual lead cardiac pacemaker. No acute osseous abnormality. IMPRESSION: No acute cardiopulmonary disease. Bilateral chronic interstitial lung disease. Electronically Signed   By: Elige Ko   On: 03/10/2021 13:03   CT Renal Stone Study  Result Date: 03/10/2021 CLINICAL DATA:  Hematuria.  Undergoing treatment for prostatitis. EXAM: CT ABDOMEN AND PELVIS WITHOUT CONTRAST TECHNIQUE: Multidetector CT imaging of the abdomen and pelvis was performed following the standard protocol without IV contrast. COMPARISON:  None. FINDINGS: Lower chest: Chronic interstitial lung disease at the lung bases with fibrosis. Hepatobiliary: No focal liver abnormality is seen. No gallstones, gallbladder wall thickening, or biliary dilatation. Pancreas: Unremarkable. No pancreatic ductal dilatation or surrounding inflammatory changes. Spleen: Normal in size without focal abnormality. Adrenals/Urinary Tract: Normal adrenal glands. 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 No other urolithiasis. No obstructive uropathy. Diffuse bladder wall thickening with haziness in the surrounding fat most concerning for cystitis. Stomach/Bowel: Stomach is within normal limits. Appendix appears normal. No evidence of bowel wall thickening, distention, or inflammatory changes. Large amount of stool in the ascending colon. Vascular/Lymphatic: Normal caliber abdominal aorta with mild atherosclerosis. No lymphadenopathy. Reproductive: Prostate is unremarkable. Other: Left inguinal hernia containing fat.  No ascites. Musculoskeletal: No acute osseous abnormality. No aggressive osseous lesion.  Moderate osteoarthritis of bilateral hips. Diffuse lumbar spine spondylosis. IMPRESSION: 1. Diffuse bladder wall thickening with haziness in the surrounding fat most concerning for cystitis. 2. Left fat containing inguinal hernia. 3. Chronic interstitial lung disease. 4.  Aortic Atherosclerosis (ICD10-I70.0). 5. 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 Electronically Signed   By: Elige Ko   On: 03/10/2021 13:29   CT Renal Stone Study  Final Result    DG Chest Port 1 View  Final Result      Scheduled Meds: . aspirin EC  81 mg Oral Daily  . carvedilol  6.25 mg Oral BID WC  . feeding supplement  237 mL Oral BID BM  . gabapentin  100 mg Oral QHS  . magnesium oxide  400 mg Oral Daily  . multivitamin with minerals  1 tablet Oral Daily  . pantoprazole  40 mg Oral Daily  . rOPINIRole  0.25 mg Oral QHS   PRN Meds: acetaminophen **OR** acetaminophen, hydrALAZINE, labetalol, ondansetron **OR** ondansetron (ZOFRAN) IV Continuous Infusions: . cefTRIAXone (ROCEPHIN)  IV 1 g (03/11/21 0924)     LOS: 1 day  Time spent: Greater than 50% of the 35 minute visit was spent in counseling/coordination of care for the patient as laid out in the A&P.   Lewie Chamber, MD [facility] 03/11/2021, 2:17 PM

## 2021-03-11 NOTE — Assessment & Plan Note (Signed)
-   no signs of flare - continue home inhalers

## 2021-03-11 NOTE — Assessment & Plan Note (Addendum)
-   Resume home meds (Coreg)

## 2021-03-11 NOTE — Progress Notes (Signed)
Initial Nutrition Assessment  DOCUMENTATION CODES:  Not applicable  INTERVENTION:  Recommend liberalizing diet to regular - spoke to MD.  Add Magic cup TID with meals, each supplement provides 290 kcal and 9 grams of protein.  Continue Ensure Enlive BID.  Add MVI with minerals daily.  NUTRITION DIAGNOSIS:  Increased nutrient needs related to acute illness,wound healing as evidenced by estimated needs.  GOAL:  Patient will meet greater than or equal to 90% of their needs  MONITOR:  PO intake,Supplement acceptance,Labs,Weight trends,Skin,I & O's  REASON FOR ASSESSMENT:  Malnutrition Screening Tool    ASSESSMENT:  87 yo male with a PMH of ILD, CHB, HLD, GERD, chronic constipation, and overactive bladder who presents with UTI, hematuria, and SIRS.  Spoke with pt, pt's daughter, and son-in-law at bedside. Pt in great spirits with excellent support system. He reports eating well at home, just not a lot. He reports not having a lot an appetite. Assured him that can happen with age. Per Epic, pt ate 75% of his dinner last night. He enjoyed his fruit plate for lunch today.  Pt reports maybe some weight change, but none he has noticed. Per Epic, pt's weight has not changed in the last month. On exam, pt did not have any depletions other than age-related ones.  Recommend continuing Ensure BID and adding Magic Cup TID to promote intake and aid in wound healing. Also recommend liberalizing diet to regular - MD in agreement.  Medications: Rocephin Labs: reviewed  NUTRITION - FOCUSED PHYSICAL EXAM: Flowsheet Row Most Recent Value  Orbital Region Mild depletion  Upper Arm Region Mild depletion  Thoracic and Lumbar Region No depletion  Buccal Region No depletion  Temple Region Mild depletion  Clavicle Bone Region No depletion  Clavicle and Acromion Bone Region Mild depletion  Scapular Bone Region No depletion  Dorsal Hand No depletion  Patellar Region No depletion  Anterior Thigh  Region No depletion  Posterior Calf Region Mild depletion  Edema (RD Assessment) None  Hair Reviewed  Eyes Reviewed  Mouth Reviewed  Skin Reviewed  Nails Reviewed     Diet Order:   Diet Order            Diet Heart Room service appropriate? Yes; Fluid consistency: Thin  Diet effective now                EDUCATION NEEDS:  Education needs have been addressed  Skin:  Skin Assessment: Skin Integrity Issues: Skin Integrity Issues:: Stage II Stage II: Pressure Ulcer - mid coccyx  Last BM:  03/07/21 - Type 3, large amount  Height:  Ht Readings from Last 1 Encounters:  03/10/21 5\' 6"  (1.676 m)   Weight:  Wt Readings from Last 1 Encounters:  03/10/21 62.1 kg   Ideal Body Weight:  64.5 kg  BMI:  Body mass index is 22.1 kg/m.  Estimated Nutritional Needs:  Kcal:  1700-1900 Protein:  85-100 grams Fluid:  >1.7 L  05/10/21, RD, LDN Registered Dietitian After Hours/Weekend Pager # [phone]

## 2021-03-11 NOTE — Assessment & Plan Note (Signed)
Continue PPI ?

## 2021-03-12 ENCOUNTER — Telehealth: Payer: Self-pay | Admitting: Pulmonary Disease

## 2021-03-12 LAB — CBC WITH DIFFERENTIAL/PLATELET
Abs Immature Granulocytes: 0.16 10*3/uL — ABNORMAL HIGH (ref 0.00–0.07)
Basophils Absolute: 0 10*3/uL (ref 0.0–0.1)
Basophils Relative: 0 %
Eosinophils Absolute: 0.3 10*3/uL (ref 0.0–0.5)
Eosinophils Relative: 2 %
HCT: 35.6 % — ABNORMAL LOW (ref 39.0–52.0)
Hemoglobin: 11.4 g/dL — ABNORMAL LOW (ref 13.0–17.0)
Immature Granulocytes: 1 %
Lymphocytes Relative: 11 %
Lymphs Abs: 1.3 10*3/uL (ref 0.7–4.0)
MCH: 30.2 pg (ref 26.0–34.0)
MCHC: 32 g/dL (ref 30.0–36.0)
MCV: 94.2 fL (ref 80.0–100.0)
Monocytes Absolute: 0.7 10*3/uL (ref 0.1–1.0)
Monocytes Relative: 6 %
Neutro Abs: 9.8 10*3/uL — ABNORMAL HIGH (ref 1.7–7.7)
Neutrophils Relative %: 80 %
Platelets: 187 10*3/uL (ref 150–400)
RBC: 3.78 MIL/uL — ABNORMAL LOW (ref 4.22–5.81)
RDW: 14.3 % (ref 11.5–15.5)
WBC: 12.3 10*3/uL — ABNORMAL HIGH (ref 4.0–10.5)
nRBC: 0 % (ref 0.0–0.2)

## 2021-03-12 LAB — BASIC METABOLIC PANEL
Anion gap: 8 (ref 5–15)
BUN: 16 mg/dL (ref 8–23)
CO2: 25 mmol/L (ref 22–32)
Calcium: 8.7 mg/dL — ABNORMAL LOW (ref 8.9–10.3)
Chloride: 103 mmol/L (ref 98–111)
Creatinine, Ser: 0.96 mg/dL (ref 0.61–1.24)
GFR, Estimated: >60 mL/min (ref >60)
Glucose, Bld: 110 mg/dL — ABNORMAL HIGH (ref 70–99)
Potassium: 3.7 mmol/L (ref 3.5–5.1)
Sodium: 136 mmol/L (ref 135–145)

## 2021-03-12 LAB — MAGNESIUM: Magnesium: 1.7 mg/dL (ref 1.7–2.4)

## 2021-03-12 MED ORDER — CEFUROXIME AXETIL 500 MG PO TABS: 500.0000 mg | ORAL_TABLET | Freq: Two times a day (BID) | ORAL | 0 refills | Status: AC

## 2021-03-12 NOTE — Telephone Encounter (Signed)
Received completed [employer] patient assistance application, signed by Dr. Isaiah Serge for [employer]. Application with insurance information faxed to [employer].

## 2021-03-12 NOTE — Discharge Summary (Signed)
Physician Discharge Summary   Justin Richmond XBJ:[id number] DOB: August 20, 1924 DOA: 03/10/2021  PCP: Johny Blamer, MD  Admit date: 03/10/2021 Discharge date: 03/12/2021   Admitted From: Home Disposition:  Home Discharging physician: Lewie Chamber, MD  Recommendations for Outpatient Follow-up:  1. Repeat UA; patient endorsed hematuria prior to admission and was passing sediment still at discharge   Patient discharged to home in Discharge Condition: stable Risk of unplanned readmission score: Unplanned Admission- Pilot do not use: 12.58  CODE STATUS: DNR Diet recommendation:  Diet Orders (From admission, onward)    Start     Ordered   03/12/21 0000  Diet general        03/12/21 0955   03/11/21 1405  Diet regular Room service appropriate? Yes; Fluid consistency: Thin  Diet effective now       Question Answer Comment  Room service appropriate? Yes   Fluid consistency: Thin      03/11/21 1404          Hospital Course: [name] is a 87 yo male with PMH recurrent UTI, osteomyelitis, osteoarthritis, OAB, ILD, hypertension, hyperlipidemia, GERD, dCHF (EF 73%, Gr 1 DD on 07/02/18 echo) who presented to the hospital with complaints of increased urinary frequency and weakness.  He also noticed some discomfort with urinating as well as change in his urine color to darker and smelling foul.  He was seen by his PCP outpatient and started on Bactrim 3 days prior to admission.  Due to not feeling better, he presented to the ER. CT renal stone study was done on admission and showed diffuse bladder wall thickening concerning for cystitis.  No nephrolithiasis identified.  He was given 1 dose of Levaquin in the ER and admitted and continued on Rocephin.  He has a previous penicillin allergy but unknown reaction that he cannot recall.  He tolerated his dose of Rocephin well on 03/11/2021. His urine culture did not grow prior to discharge.  He was continued on 2 more days of cefuroxime which would be  a total of 5-day course of antibiotics.  His urine color had improved prior to discharge returning more clear in color.  He also endorsed improved urinary symptoms overall but was still endorsing some increased frequency prior to discharge as well as some sediment noticed in his urine. Would recommend repeat UA at outpatient follow-up.  He had also endorsed seeing blood-tinged urine at home prior to admission, but there was no further gross hematuria during hospitalization.  Of note, urinalysis on admission did have greater than 50 RBC.   * UTI (urinary tract infection) - Patient symptomatic with increased frequency, dysuria, foul smell, and color change; even reported subtle hematuria - patient on bactrim prior to admission as well (approx 2-3 days per patient) - UA consistent with UTI (Lrg LE, neg nitrite, >50 WBC, rare bacteria). Lrg Hgb, >50 RBC also noted - Follow-up urine culture (<10k colonies); possibly from abx prior to admission - Received Levaquin x1 in ER and started on Rocephin during hospitalization.  Tolerated Rocephin dose on 03/11/2021 despite unknown reaction to penicillin allergy -Continue Rocephin while in hospital - d/c with cefuroxime for 2 more days (5 days total course length) not including bactrim use prior to admission - repeat UA at follow to re-assess hematuria  History of complete heart block - Pacemaker in place and functioning well at last device check (01/30/21)  ILD (interstitial lung disease) (HCC) - no signs of flare - continue home inhalers  RLS (restless legs syndrome) -  Continue multivitamin and Requip  GERD (gastroesophageal reflux disease) - Continue PPI  Chronic diastolic CHF (congestive heart failure) (HCC) - No signs or symptoms of exacerbation - Previous echo reviewed from 06/22/2018: 73% EF, grade 1 diastolic dysfunction - Continue aspirin and Coreg  Essential hypertension - Resume home meds (Coreg)    The patient's chronic medical  conditions were treated accordingly per the patient's home medication regimen except as noted.  On day of discharge, patient was felt deemed stable for discharge. Patient/family member advised to call PCP or come back to ER if needed.   Principal Diagnosis: UTI (urinary tract infection)  Discharge Diagnoses: Active Hospital Problems   Diagnosis Date Noted  . UTI (urinary tract infection) 03/10/2021    Priority: High  . Pressure injury of skin 03/11/2021  . Essential hypertension 03/11/2021  . Chronic diastolic CHF (congestive heart failure) (HCC) 03/11/2021  . GERD (gastroesophageal reflux disease) 03/11/2021  . RLS (restless legs syndrome) 03/11/2021  . ILD (interstitial lung disease) (HCC) 03/11/2021  . History of complete heart block 03/11/2021    Resolved Hospital Problems  No resolved problems to display.    Discharge Instructions    Diet general   Complete by: As directed    Increase activity slowly   Complete by: As directed    No wound care   Complete by: As directed      Allergies as of 03/12/2021      Reactions   Penicillins    Unknown reaction, "it's been a long time".      Medication List    STOP taking these medications   sulfamethoxazole-trimethoprim 800-160 MG tablet Commonly known as: BACTRIM DS     TAKE these medications   aspirin EC 81 MG tablet Take 81 mg by mouth in the morning and at bedtime.   Breztri Aerosphere 160-9-4.8 MCG/ACT Aero Generic drug: Budeson-Glycopyrrol-Formoterol Inhale 2 puffs into the lungs 2 (two) times daily. What changed:   when to take this  reasons to take this   carvedilol 6.25 MG tablet Commonly known as: COREG Take 6.25 mg by mouth 2 (two) times daily with a meal.   cefUROXime 500 MG tablet Commonly known as: CEFTIN Take 1 tablet (500 mg total) by mouth 2 (two) times daily with a meal for 2 days. Start taking on: Mar 13, 2021   Co Q10 100 MG Caps Take 1 capsule by mouth daily.   Digestive Adv  Digestive/Immune Caps Take 1 capsule by mouth in the morning and at bedtime.   esomeprazole 20 MG capsule Commonly known as: NEXIUM Take 20 mg by mouth daily at 12 noon.   gabapentin 100 MG capsule Commonly known as: NEURONTIN Take 100 mg by mouth at bedtime.   Garlic 500 MG Caps Take 1 capsule by mouth daily.   Magnesium Malate 1250 (141.7 Mg) MG Tabs Take 1 tablet by mouth at bedtime.   magnesium oxide 400 MG tablet Commonly known as: MAG-OX Take 400 mg by mouth daily.   rOPINIRole 0.25 MG tablet Commonly known as: REQUIP Take 0.25 mg by mouth at bedtime.   rosuvastatin 10 MG tablet Commonly known as: CRESTOR Take 10 mg by mouth daily.   Turmeric 450 MG Caps Take 1 capsule by mouth daily.   Vitamin A 7.5 MG (25000 UT) Caps Take 1 capsule by mouth daily.   Vitamin B-12 5000 MCG Subl Place under the tongue.   VITAMIN K2-VITAMIN D3 PO Take 1 capsule by mouth daily.  Follow-up Information    Johny Blamer, MD. Schedule an appointment as soon as possible for a visit in 1 week(s).   Specialty: Family Medicine Contact information: [phone] [place] [address] [phone]              Allergies  Allergen Reactions  . Penicillins     Unknown reaction, "it's been a long time".    Consultations: n/a  Discharge Exam: BP (!) 154/79 (BP Location: Left Arm)   Pulse (!) 45   Temp 98.5 F (36.9 C) (Oral)   Resp 20   Ht 5\' 6"  (1.676 m)   Wt 62.1 kg   SpO2 91%   BMI 22.10 kg/m  General appearance: alert, cooperative and no distress Head: Normocephalic, without obvious abnormality, atraumatic Eyes: EOMI Lungs: clear to auscultation bilaterally Heart: regular rate and rhythm and S1, S2 normal Abdomen: soft, NT, ND, BS present GU: urine noted in purewick container to be clear yellow, no blood tinge Extremities: no edema Skin: mobility and turgor normal Neurologic: Grossly normal  The results of significant diagnostics  from this hospitalization (including imaging, microbiology, ancillary and laboratory) are listed below for reference.   Microbiology: Recent Results (from the past 240 hour(s))  Urine Culture     Status: Abnormal   Collection Time: 03/10/21 12:30 PM   Specimen: Urine, Random  Result Value Ref Range Status   Specimen Description   Final    URINE, RANDOM Performed at [facility], [address] [place], [place]    Special Requests   Final    NONE Performed at [facility], [address], [place], [place]    Culture (A)  Final    <10,000 COLONIES/mL INSIGNIFICANT GROWTH Performed at [facility], [address], [place], [place]    Report Status 03/11/2021 FINAL  Final  Culture, blood (Routine X 2) w Reflex to ID Panel     Status: None (Preliminary result)   Collection Time: 03/10/21 12:30 PM   Specimen: BLOOD  Result Value Ref Range Status   Specimen Description   Final    BLOOD LEFT ANTECUBITAL Performed at [facility], [address] [place], [place]    Special Requests   Final    BOTTLES DRAWN AEROBIC AND ANAEROBIC Blood Culture results may not be optimal due to an inadequate volume of blood received in culture bottles Performed at [facility], [address] [place], [place]    Culture   Final    NO GROWTH 2 DAYS Performed at [facility], [address] [place], [place]    Report Status PENDING  Incomplete  Resp Panel by RT-PCR (Flu A&B, Covid) Nasopharyngeal Swab     Status: None   Collection Time: 03/10/21 12:30 PM   Specimen: Nasopharyngeal Swab; Nasopharyngeal(NP) swabs in vial transport medium  Result Value Ref Range Status   SARS Coronavirus 2 by RT PCR NEGATIVE NEGATIVE Final    Comment: (NOTE) SARS-CoV-2 target nucleic acids are NOT DETECTED.  The SARS-CoV-2 RNA is generally detectable in upper  respiratory specimens during the acute phase of infection. The lowest concentration of SARS-CoV-2 viral copies this assay can detect is 138 copies/mL. A negative result does not preclude SARS-Cov-2 infection and should not be used as the sole basis for treatment or other patient management decisions. A negative result may occur with  improper specimen collection/handling, submission  of specimen other than nasopharyngeal swab, presence of viral mutation(s) within the areas targeted by this assay, and inadequate number of viral copies(<138 copies/mL). A negative result must be combined with clinical observations, patient history, and epidemiological information. The expected result is Negative.  Fact Sheet for Patients:  BloggerCourse.com  Fact Sheet for Healthcare Providers:  SeriousBroker.it  This test is no t yet approved or cleared by the [place] FDA and  has been authorized for detection and/or diagnosis of SARS-CoV-2 by FDA under an Emergency Use Authorization (EUA). This EUA will remain  in effect (meaning this test can be used) for the duration of the COVID-19 declaration under Section 564(b)(1) of the Act, 21 U.S.C.section 360bbb-3(b)(1), unless the authorization is terminated  or revoked sooner.       Influenza A by PCR NEGATIVE NEGATIVE Final   Influenza B by PCR NEGATIVE NEGATIVE Final    Comment: (NOTE) The Xpert Xpress SARS-CoV-2/FLU/RSV plus assay is intended as an aid in the diagnosis of influenza from Nasopharyngeal swab specimens and should not be used as a sole basis for treatment. Nasal washings and aspirates are unacceptable for Xpert Xpress SARS-CoV-2/FLU/RSV testing.  Fact Sheet for Patients: BloggerCourse.com  Fact Sheet for Healthcare Providers: SeriousBroker.it  This test is not yet approved or cleared by the [place] FDA and has been  authorized for detection and/or diagnosis of SARS-CoV-2 by FDA under an Emergency Use Authorization (EUA). This EUA will remain in effect (meaning this test can be used) for the duration of the COVID-19 declaration under Section 564(b)(1) of the Act, 21 U.S.C. section 360bbb-3(b)(1), unless the authorization is terminated or revoked.  Performed at [facility], [address] [place], [place] [address]   Culture, blood (Routine X 2) w Reflex to ID Panel     Status: None (Preliminary result)   Collection Time: 03/10/21 12:34 PM   Specimen: BLOOD  Result Value Ref Range Status   Specimen Description   Final    BLOOD RIGHT ANTECUBITAL Performed at [facility], [address] [place], [place] [address]    Special Requests   Final    BOTTLES DRAWN AEROBIC AND ANAEROBIC Blood Culture results may not be optimal due to an inadequate volume of blood received in culture bottles Performed at [facility], [address] [place], [place] [address]    Culture   Final    NO GROWTH 2 DAYS Performed at [facility], [address], [place], [place] [address]    Report Status PENDING  Incomplete     Labs: BNP (last 3 results) No results for input(s): BNP in the last 8760 hours. Basic Metabolic Panel: Recent Labs  Lab 03/10/21 1232 03/11/21 0342 03/12/21 0325  NA 136 141 136  K 4.2 4.0 3.7  CL 108 108 103  CO2 20* 25 25  GLUCOSE 85 95 110*  BUN 22 18 16   CREATININE 1.03 0.91 0.96  CALCIUM 8.5* 8.5* 8.7*  MG  --   --  1.7   Liver Function Tests: Recent Labs  Lab 03/10/21 1232 03/11/21 0342  AST 19 22  ALT 13 13  ALKPHOS 51 38  BILITOT 0.6 0.4  PROT 6.8 6.0*  ALBUMIN 3.1* 2.6*   No results for input(s): LIPASE, AMYLASE in the last 168 hours. No results for input(s): AMMONIA in the last 168 hours. CBC: Recent Labs  Lab 03/10/21 1232 03/11/21 0342 03/12/21 0325  WBC 8.5 16.5* 12.3*  NEUTROABS 7.9*  --  9.8*  HGB 12.2* 11.0* 11.4*  HCT 36.4* 33.9* 35.6*  MCV 92.6 95.2 94.2  PLT 186 161 187   Cardiac Enzymes: No results for input(s): CKTOTAL, CKMB, CKMBINDEX, TROPONINI in the last 168 hours. BNP: Invalid input(s): POCBNP CBG: No results for input(s): GLUCAP in the last 168 hours. D-Dimer No results for input(s): DDIMER in the last 72 hours. Hgb A1c No results for input(s): HGBA1C in the last 72 hours. Lipid Profile No results for input(s): CHOL, HDL, LDLCALC, TRIG, CHOLHDL, LDLDIRECT in the last 72 hours. Thyroid function studies No results for input(s): TSH, T4TOTAL, T3FREE, THYROIDAB in the last 72 hours.  Invalid input(s): FREET3 Anemia work up No results for input(s): VITAMINB12, FOLATE, FERRITIN, TIBC, IRON, RETICCTPCT in the last 72 hours. Urinalysis    Component Value Date/Time   COLORURINE YELLOW 03/10/2021 1230   APPEARANCEUR HAZY (A) 03/10/2021 1230   LABSPEC 1.008 03/10/2021 1230   PHURINE 6.0 03/10/2021 1230   GLUCOSEU NEGATIVE 03/10/2021 1230   HGBUR LARGE (A) 03/10/2021 1230   BILIRUBINUR NEGATIVE 03/10/2021 1230   KETONESUR NEGATIVE 03/10/2021 1230   PROTEINUR 30 (A) 03/10/2021 1230   NITRITE NEGATIVE 03/10/2021 1230   LEUKOCYTESUR LARGE (A) 03/10/2021 1230   Sepsis Labs Invalid input(s): PROCALCITONIN,  WBC,  LACTICIDVEN Microbiology Recent Results (from the past 240 hour(s))  Urine Culture     Status: Abnormal   Collection Time: 03/10/21 12:30 PM   Specimen: Urine, Random  Result Value Ref Range Status   Specimen Description   Final    URINE, RANDOM Performed at [facility], [address], [place], [place] [address]    Special Requests   Final    NONE Performed at [facility], [address] [place], [place] [address]    Culture (A)  Final    <10,000 COLONIES/mL INSIGNIFICANT GROWTH Performed at [facility], [address], [place], [place] [address]    Report Status 03/11/2021 FINAL  Final   Culture, blood (Routine X 2) w Reflex to ID Panel     Status: None (Preliminary result)   Collection Time: 03/10/21 12:30 PM   Specimen: BLOOD  Result Value Ref Range Status   Specimen Description   Final    BLOOD LEFT ANTECUBITAL Performed at [facility], [address] [place], [place] [address]    Special Requests   Final    BOTTLES DRAWN AEROBIC AND ANAEROBIC Blood Culture results may not be optimal due to an inadequate volume of blood received in culture bottles Performed at [facility], [address], [place], [place] [address]    Culture   Final    NO GROWTH 2 DAYS Performed at [facility], [address], [place], [place] [address]    Report Status PENDING  Incomplete  Resp Panel by RT-PCR (Flu A&B, Covid) Nasopharyngeal Swab     Status: None   Collection Time: 03/10/21 12:30 PM   Specimen: Nasopharyngeal Swab; Nasopharyngeal(NP) swabs in vial transport medium  Result Value Ref Range Status   SARS Coronavirus 2 by RT PCR NEGATIVE NEGATIVE Final    Comment: (NOTE) SARS-CoV-2 target nucleic acids are NOT DETECTED.  The SARS-CoV-2 RNA is generally detectable in upper respiratory specimens during the acute phase of infection. The lowest concentration of SARS-CoV-2 viral copies this assay can detect is 138 copies/mL. A negative result does not preclude SARS-Cov-2 infection and should not be used as the sole basis for treatment or other  patient management decisions. A negative result may occur with  improper specimen collection/handling, submission of specimen other than nasopharyngeal swab, presence of viral mutation(s) within the areas targeted by this assay, and inadequate number of viral copies(<138 copies/mL). A negative result must be combined with clinical observations, patient history, and epidemiological information. The expected result is Negative.  Fact Sheet for Patients:   BloggerCourse.com  Fact Sheet for Healthcare Providers:  SeriousBroker.it  This test is no t yet approved or cleared by the [place] FDA and  has been authorized for detection and/or diagnosis of SARS-CoV-2 by FDA under an Emergency Use Authorization (EUA). This EUA will remain  in effect (meaning this test can be used) for the duration of the COVID-19 declaration under Section 564(b)(1) of the Act, 21 U.S.C.section 360bbb-3(b)(1), unless the authorization is terminated  or revoked sooner.       Influenza A by PCR NEGATIVE NEGATIVE Final   Influenza B by PCR NEGATIVE NEGATIVE Final    Comment: (NOTE) The Xpert Xpress SARS-CoV-2/FLU/RSV plus assay is intended as an aid in the diagnosis of influenza from Nasopharyngeal swab specimens and should not be used as a sole basis for treatment. Nasal washings and aspirates are unacceptable for Xpert Xpress SARS-CoV-2/FLU/RSV testing.  Fact Sheet for Patients: BloggerCourse.com  Fact Sheet for Healthcare Providers: SeriousBroker.it  This test is not yet approved or cleared by the [place] FDA and has been authorized for detection and/or diagnosis of SARS-CoV-2 by FDA under an Emergency Use Authorization (EUA). This EUA will remain in effect (meaning this test can be used) for the duration of the COVID-19 declaration under Section 564(b)(1) of the Act, 21 U.S.C. section 360bbb-3(b)(1), unless the authorization is terminated or revoked.  Performed at [facility], [address] [place], [place] [address]   Culture, blood (Routine X 2) w Reflex to ID Panel     Status: None (Preliminary result)   Collection Time: 03/10/21 12:34 PM   Specimen: BLOOD  Result Value Ref Range Status   Specimen Description   Final    BLOOD RIGHT ANTECUBITAL Performed at [facility], [address],  [place], [place] [address]    Special Requests   Final    BOTTLES DRAWN AEROBIC AND ANAEROBIC Blood Culture results may not be optimal due to an inadequate volume of blood received in culture bottles Performed at [facility], [address] [place], [place] [address]    Culture   Final    NO GROWTH 2 DAYS Performed at [facility], [address] [place], [place] [address]    Report Status PENDING  Incomplete    Procedures/Studies: DG Chest Port 1 View  Result Date: 03/10/2021 CLINICAL DATA:  Shortness of breath EXAM: PORTABLE CHEST 1 VIEW COMPARISON:  CT chest 07/20/2020 FINDINGS: Chronic bilateral interstitial thickening. No focal consolidation. No pleural effusion or pneumothorax. Heart and mediastinal contours are unremarkable. Dual lead cardiac pacemaker. No acute osseous abnormality. IMPRESSION: No acute cardiopulmonary disease. Bilateral chronic interstitial lung disease. Electronically Signed   By: Elige Ko   On: 03/10/2021 13:03   CT Renal Stone Study  Result Date: 03/10/2021 CLINICAL DATA:  Hematuria.  Undergoing treatment for prostatitis. EXAM: CT ABDOMEN AND PELVIS WITHOUT CONTRAST TECHNIQUE: Multidetector CT imaging of the abdomen and pelvis was performed following the standard protocol without IV contrast. COMPARISON:  None. FINDINGS: Lower chest: Chronic interstitial lung disease at the lung bases with fibrosis. Hepatobiliary: No focal liver abnormality is  seen. No gallstones, gallbladder wall thickening, or biliary dilatation. Pancreas: Unremarkable. No pancreatic ductal dilatation or surrounding inflammatory changes. Spleen: Normal in size without focal abnormality. Adrenals/Urinary Tract: Normal adrenal glands. 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 No other urolithiasis. No obstructive uropathy. Diffuse bladder wall thickening with haziness in the surrounding fat most  concerning for cystitis. Stomach/Bowel: Stomach is within normal limits. Appendix appears normal. No evidence of bowel wall thickening, distention, or inflammatory changes. Large amount of stool in the ascending colon. Vascular/Lymphatic: Normal caliber abdominal aorta with mild atherosclerosis. No lymphadenopathy. Reproductive: Prostate is unremarkable. Other: Left inguinal hernia containing fat.  No ascites. Musculoskeletal: No acute osseous abnormality. No aggressive osseous lesion. Moderate osteoarthritis of bilateral hips. Diffuse lumbar spine spondylosis. IMPRESSION: 1. Diffuse bladder wall thickening with haziness in the surrounding fat most concerning for cystitis. 2. Left fat containing inguinal hernia. 3. Chronic interstitial lung disease. 4.  Aortic Atherosclerosis (ICD10-I70.0). 5. 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 Electronically Signed   By: Elige Ko   On: 03/10/2021 13:29     Time coordinating discharge: Over 30 minutes    Lewie Chamber, MD  [facility] 03/12/2021, 4:56 PM

## 2021-03-14 ENCOUNTER — Ambulatory Visit
Admission: RE | Admit: 2021-03-14 | Discharge: 2021-03-14 | Disposition: A | Discharge status: Home / Self Care | Payer: Medicare Other | Source: Ambulatory Visit | Attending: Pulmonary Disease | Admitting: Pulmonary Disease

## 2021-03-14 ENCOUNTER — Other Ambulatory Visit: Payer: Self-pay

## 2021-03-14 DIAGNOSIS — J849 Interstitial pulmonary disease, unspecified: Secondary | ICD-10-CM

## 2021-03-15 LAB — CULTURE, BLOOD (ROUTINE X 2)
Culture: NO GROWTH
Culture: NO GROWTH

## 2021-03-21 ENCOUNTER — Encounter: Payer: Self-pay | Admitting: Internal Medicine

## 2021-03-21 ENCOUNTER — Other Ambulatory Visit: Payer: Self-pay

## 2021-03-21 ENCOUNTER — Ambulatory Visit (INDEPENDENT_AMBULATORY_CARE_PROVIDER_SITE_OTHER): Payer: Medicare Other | Admitting: Internal Medicine

## 2021-03-21 VITALS — BP 160/80 | HR 70 | Ht 65.0 in | Wt 133.6 lb

## 2021-03-21 DIAGNOSIS — I493 Ventricular premature depolarization: Secondary | ICD-10-CM | POA: Diagnosis not present

## 2021-03-21 DIAGNOSIS — Z950 Presence of cardiac pacemaker: Secondary | ICD-10-CM

## 2021-03-21 DIAGNOSIS — I442 Atrioventricular block, complete: Secondary | ICD-10-CM | POA: Diagnosis not present

## 2021-03-21 MED ORDER — CARVEDILOL 12.5 MG PO TABS: 12.5000 mg | ORAL_TABLET | Freq: Two times a day (BID) | ORAL | 3 refills | Status: DC

## 2021-03-21 NOTE — Progress Notes (Signed)
Patient Care Team: Johny Blamer, MD as PCP - General (Family Medicine)   HPI Justin Richmond is a 87 y.o. male Implanted for syncope 2009 with generator replacement 2018  No recurrent syncope  Largely nonambulatory 2/2 DOE but also limited by debility and weakness.  Dyspnea has been incompletely evaluated.  CT scan showed interstitial lung disease.  Pulmonary consultation was considered but never consummated.  There is no accompanying chest discomfort not withstanding his known coronary artery disease    Major issue is weakness upon standing.  Fear of falling.  He reminds me that he has no flexion in his left leg related to osteomyelitis as a young boy  No chest pain.  DATE TEST EF   2002 LHC    % Ramus severe Mild diffuse disease  11/14 MYOVIEW  36 % Mod anteroapical Fixed defect  6/18 Echo  74%           Past Medical History:  Diagnosis Date  . Arrhythmia   . Balance problems   . Chronic back pain   . Chronic constipation   . Congestive heart failure (HCC)   . Diarrhea   . Eczema   . Eye disease    cataracts/glaucoma  . GERD (gastroesophageal reflux disease)   . Hearing loss   . Hemorrhoids   . High cholesterol   . Hypertension   . Irregular heartbeat   . Joint pain   . Joint stiffness   . Migraine headache   . Mouth sores   . Muscle weakness   . OAB (overactive bladder)   . Osteoarthritis   . Osteomyelitis (HCC)    childhood, left leg  . Pacemaker   . Poor circulation   . Pure hypercholesterolemia   . Rectal bleeding   . Recurrent UTI   . Scoliosis   . Shortness of breath   . Skin disorder   . Spinal stenosis, lumbar region with neurogenic claudication     Past Surgical History:  Procedure Laterality Date  . DENTAL SURGERY     tooth removal, root canals  . INSERT / REPLACE / REMOVE PACEMAKER    . osteomyelitis surgery     x10 surgeries  . SPINAL INJECTION     x3     Current Meds  Medication Sig  . aspirin EC 81 MG  tablet Take 81 mg by mouth in the morning and at bedtime.  . Budeson-Glycopyrrol-Formoterol (BREZTRI AEROSPHERE) 160-9-4.8 MCG/ACT AERO Inhale 2 puffs into the lungs 2 (two) times daily.  . carvedilol (COREG) 6.25 MG tablet Take 6.25 mg by mouth 2 (two) times daily with a meal.  . cefUROXime (CEFTIN) 500 MG tablet daily.  . Coenzyme Q10 (CO Q10) 100 MG CAPS Take 1 capsule by mouth daily.  . Cyanocobalamin (VITAMIN B-12) 5000 MCG SUBL Place under the tongue.  Marland Kitchen esomeprazole (NEXIUM) 20 MG capsule Take 20 mg by mouth daily at 12 noon.  . gabapentin (NEURONTIN) 100 MG capsule Take 100 mg by mouth at bedtime.  . Garlic 500 MG CAPS Take 1 capsule by mouth daily.  . Magnesium Malate 1250 (141.7 Mg) MG TABS Take 1 tablet by mouth at bedtime.  Marland Kitchen oxybutynin (DITROPAN) 5 MG tablet in the morning and at bedtime.  . Probiotic Product (DIGESTIVE ADV DIGESTIVE/IMMUNE) CAPS Take 1 capsule by mouth daily.  Marland Kitchen rOPINIRole (REQUIP) 0.25 MG tablet Take 0.25 mg by mouth at bedtime.  . rosuvastatin (CRESTOR) 10 MG tablet Take 10 mg by mouth daily.  Marland Kitchen  Turmeric 450 MG CAPS Take 1 capsule by mouth daily.  . Vitamin A 7.5 MG (25000 UT) CAPS Take 1 capsule by mouth daily.  . Vitamin D-Vitamin K (VITAMIN K2-VITAMIN D3 PO) Take 1 capsule by mouth daily.    Allergies  Allergen Reactions  . Penicillins     Unknown reaction, "it's been a long time".      Review of Systems negative except from HPI and PMH  Physical Exam BP (!) 160/80   Pulse 70   Ht 5\' 5"  (1.651 m)   Wt 133 lb 9.6 oz (60.6 kg)   SpO2 95%   BMI 22.23 kg/m  Well developed and well nourished in no acute distress HENT normal E scleral and icterus clear Neck Supple JVP flat; carotids brisk and full Clear to ausculation  Regular rate and rhythm, no murmurs gallops or rub Soft with active bowel sounds No clubbing cyanosis  Edema left leg does not bend.  Distal pulses intact Alert and oriented, grossly normal motor and sensory function Skin  Warm and Dry  ECG sinsu w P-synchronous/ AV  pacing   Estimated Creatinine Clearance: 40.3 mL/min (by C-G formula based on SCr of 0.96 mg/dL).   Assessment and  Plan  Complete heart block  Pacemaker-[employer]  Atrial lead failure-polarity switch to unipolar  Dyspnea on exertion  Coronary artery disease  Tingling bilaterally likely neuropathy   Significant mount of noise on his unipolar atrial lead.  This was eliminated with bipolar programming.  He mostly tracks in the atrium anyway and we will leave it bipolar for now.  Weakness upon standing I was hoping would be orthostatic.  However, this was unrevealing.  Have encouraged him to work on strengthening his right leg and have reached out to his primary care physician to consider physical therapy.  He responded and he will do so.  Cold hands and cold feet I do not think are related to perfusion.  He has great distal pulses.    Current medicines are reviewed at length with the patient today .  The patient does not  have concerns regarding medicines.

## 2021-03-21 NOTE — Patient Instructions (Signed)
Medication Instructions:  Your physician has recommended you make the following change in your medication:   ** Increase Carvedilol to 12.5mg  - 1 tablet by mouth twice daily.  *If you need a refill on your cardiac medications before your next appointment, please call your pharmacy*   Lab Work: None ordered.  If you have labs (blood work) drawn today and your tests are completely normal, you will receive your results only by: Marland Kitchen MyChart Message (if you have MyChart) OR . A paper copy in the mail If you have any lab test that is abnormal or we need to change your treatment, we will call you to review the results.   Testing/Procedures: None ordered.    Follow-Up: At [facility], you and your health needs are our priority.  As part of our continuing mission to provide you with exceptional heart care, we have created designated Provider Care Teams.  These Care Teams include your primary Cardiologist (physician) and Advanced Practice Providers (APPs -  Physician Assistants and Nurse Practitioners) who all work together to provide you with the care you need, when you need it.  We recommend signing up for the patient portal called "MyChart".  Sign up information is provided on this After Visit Summary.  MyChart is used to connect with patients for Virtual Visits (Telemedicine).  Patients are able to view lab/test results, encounter notes, upcoming appointments, etc.  Non-urgent messages can be sent to your provider as well.   To learn more about what you can do with MyChart, go to [email or web address].    Your next appointment:   12 month(s)  The format for your next appointment:   In Person  Provider:   Sherryl Manges, MD

## 2021-05-01 ENCOUNTER — Ambulatory Visit (INDEPENDENT_AMBULATORY_CARE_PROVIDER_SITE_OTHER): Payer: Medicare Other

## 2021-05-01 DIAGNOSIS — I442 Atrioventricular block, complete: Secondary | ICD-10-CM | POA: Diagnosis not present

## 2021-05-02 LAB — CUP PACEART REMOTE DEVICE CHECK
Battery Remaining Longevity: 66 mo
Battery Remaining Percentage: 100 %
Brady Statistic RA Percent Paced: 8 %
Brady Statistic RV Percent Paced: 99 %
Date Time Interrogation Session: 20220623100400
Implantable Lead Implant Date: 20090901
Implantable Lead Implant Date: 20090901
Implantable Lead Location: 753859
Implantable Lead Location: 753860
Implantable Lead Model: 4076
Implantable Lead Model: 4076
Implantable Pulse Generator Implant Date: 20180723
Lead Channel Impedance Value: 317 Ohm
Lead Channel Impedance Value: 903 Ohm
Lead Channel Pacing Threshold Amplitude: 0.9 V
Lead Channel Pacing Threshold Pulse Width: 0.4 ms
Lead Channel Setting Pacing Amplitude: 1.3 V
Lead Channel Setting Pacing Amplitude: 2 V
Lead Channel Setting Pacing Pulse Width: 0.4 ms
Lead Channel Setting Sensing Sensitivity: 2.5 mV
Pulse Gen Serial Number: 357327

## 2021-05-17 ENCOUNTER — Other Ambulatory Visit: Payer: Self-pay

## 2021-05-17 ENCOUNTER — Encounter (HOSPITAL_COMMUNITY): Payer: Self-pay

## 2021-05-17 ENCOUNTER — Inpatient Hospital Stay (HOSPITAL_COMMUNITY)
Admission: EM | Admit: 2021-05-17 | Discharge: 2021-06-03 | DRG: 871 | Disposition: A | Discharge status: Skilled Nursing Facility | Payer: Medicare Other | Attending: Internal Medicine | Admitting: Internal Medicine

## 2021-05-17 ENCOUNTER — Emergency Department (HOSPITAL_COMMUNITY): Payer: Medicare Other

## 2021-05-17 DIAGNOSIS — N3281 Overactive bladder: Secondary | ICD-10-CM | POA: Diagnosis present

## 2021-05-17 DIAGNOSIS — L89302 Pressure ulcer of unspecified buttock, stage 2: Secondary | ICD-10-CM | POA: Diagnosis present

## 2021-05-17 DIAGNOSIS — Z20822 Contact with and (suspected) exposure to covid-19: Secondary | ICD-10-CM | POA: Diagnosis present

## 2021-05-17 DIAGNOSIS — Z66 Do not resuscitate: Secondary | ICD-10-CM | POA: Diagnosis present

## 2021-05-17 DIAGNOSIS — Z825 Family history of asthma and other chronic lower respiratory diseases: Secondary | ICD-10-CM

## 2021-05-17 DIAGNOSIS — J189 Pneumonia, unspecified organism: Secondary | ICD-10-CM | POA: Diagnosis present

## 2021-05-17 DIAGNOSIS — Z823 Family history of stroke: Secondary | ICD-10-CM

## 2021-05-17 DIAGNOSIS — Z7982 Long term (current) use of aspirin: Secondary | ICD-10-CM

## 2021-05-17 DIAGNOSIS — Z950 Presence of cardiac pacemaker: Secondary | ICD-10-CM

## 2021-05-17 DIAGNOSIS — H9190 Unspecified hearing loss, unspecified ear: Secondary | ICD-10-CM | POA: Diagnosis present

## 2021-05-17 DIAGNOSIS — R6520 Severe sepsis without septic shock: Secondary | ICD-10-CM | POA: Diagnosis present

## 2021-05-17 DIAGNOSIS — I110 Hypertensive heart disease with heart failure: Secondary | ICD-10-CM | POA: Diagnosis present

## 2021-05-17 DIAGNOSIS — J9601 Acute respiratory failure with hypoxia: Secondary | ICD-10-CM

## 2021-05-17 DIAGNOSIS — Z809 Family history of malignant neoplasm, unspecified: Secondary | ICD-10-CM

## 2021-05-17 DIAGNOSIS — I442 Atrioventricular block, complete: Secondary | ICD-10-CM | POA: Diagnosis present

## 2021-05-17 DIAGNOSIS — Z87891 Personal history of nicotine dependence: Secondary | ICD-10-CM

## 2021-05-17 DIAGNOSIS — L89322 Pressure ulcer of left buttock, stage 2: Secondary | ICD-10-CM | POA: Diagnosis present

## 2021-05-17 DIAGNOSIS — J9621 Acute and chronic respiratory failure with hypoxia: Secondary | ICD-10-CM | POA: Diagnosis present

## 2021-05-17 DIAGNOSIS — A419 Sepsis, unspecified organism: Secondary | ICD-10-CM | POA: Diagnosis not present

## 2021-05-17 DIAGNOSIS — I5043 Acute on chronic combined systolic (congestive) and diastolic (congestive) heart failure: Secondary | ICD-10-CM | POA: Diagnosis present

## 2021-05-17 DIAGNOSIS — K5900 Constipation, unspecified: Secondary | ICD-10-CM | POA: Diagnosis present

## 2021-05-17 DIAGNOSIS — I509 Heart failure, unspecified: Secondary | ICD-10-CM

## 2021-05-17 DIAGNOSIS — K649 Unspecified hemorrhoids: Secondary | ICD-10-CM | POA: Diagnosis present

## 2021-05-17 DIAGNOSIS — E7800 Pure hypercholesterolemia, unspecified: Secondary | ICD-10-CM | POA: Diagnosis present

## 2021-05-17 DIAGNOSIS — J849 Interstitial pulmonary disease, unspecified: Secondary | ICD-10-CM | POA: Diagnosis present

## 2021-05-17 DIAGNOSIS — Z880 Allergy status to penicillin: Secondary | ICD-10-CM

## 2021-05-17 DIAGNOSIS — L8990 Pressure ulcer of unspecified site, unspecified stage: Secondary | ICD-10-CM | POA: Insufficient documentation

## 2021-05-17 DIAGNOSIS — Z87440 Personal history of urinary (tract) infections: Secondary | ICD-10-CM

## 2021-05-17 DIAGNOSIS — I951 Orthostatic hypotension: Secondary | ICD-10-CM

## 2021-05-17 DIAGNOSIS — Z79899 Other long term (current) drug therapy: Secondary | ICD-10-CM

## 2021-05-17 DIAGNOSIS — G8929 Other chronic pain: Secondary | ICD-10-CM | POA: Diagnosis present

## 2021-05-17 DIAGNOSIS — K219 Gastro-esophageal reflux disease without esophagitis: Secondary | ICD-10-CM | POA: Diagnosis present

## 2021-05-17 LAB — COMPREHENSIVE METABOLIC PANEL
ALT: 27 U/L (ref 0–44)
AST: 39 U/L (ref 15–41)
Albumin: 3.1 g/dL — ABNORMAL LOW (ref 3.5–5.0)
Alkaline Phosphatase: 55 U/L (ref 38–126)
Anion gap: 10 (ref 5–15)
BUN: 28 mg/dL — ABNORMAL HIGH (ref 8–23)
CO2: 25 mmol/L (ref 22–32)
Calcium: 8.8 mg/dL — ABNORMAL LOW (ref 8.9–10.3)
Chloride: 97 mmol/L — ABNORMAL LOW (ref 98–111)
Creatinine, Ser: 0.92 mg/dL (ref 0.61–1.24)
GFR, Estimated: >60 mL/min (ref >60)
Glucose, Bld: 165 mg/dL — ABNORMAL HIGH (ref 70–99)
Potassium: 4.1 mmol/L (ref 3.5–5.1)
Sodium: 132 mmol/L — ABNORMAL LOW (ref 135–145)
Total Bilirubin: 1 mg/dL (ref 0.3–1.2)
Total Protein: 7.6 g/dL (ref 6.5–8.1)

## 2021-05-17 LAB — RESP PANEL BY RT-PCR (FLU A&B, COVID) ARPGX2
Influenza A by PCR: NEGATIVE
Influenza B by PCR: NEGATIVE
SARS Coronavirus 2 by RT PCR: NEGATIVE

## 2021-05-17 LAB — PROTIME-INR
INR: 1.1 (ref 0.8–1.2)
Prothrombin Time: 14.3 seconds (ref 11.4–15.2)

## 2021-05-17 LAB — LACTIC ACID, PLASMA: Lactic Acid, Venous: 1.3 mmol/L (ref 0.5–1.9)

## 2021-05-17 MED ORDER — ALBUTEROL SULFATE HFA 108 (90 BASE) MCG/ACT IN AERS
2.0000 | INHALATION_SPRAY | RESPIRATORY_TRACT | Status: DC | PRN
  Filled 2021-05-17: qty 6.7

## 2021-05-17 MED ORDER — SODIUM CHLORIDE 0.9 % IV SOLN
1.0000 g | Freq: Once | INTRAVENOUS | Status: AC
  Administered 2021-05-18: 1 g via INTRAVENOUS
  Filled 2021-05-17: qty 10

## 2021-05-17 MED ORDER — SODIUM CHLORIDE 0.9 % IV BOLUS
500.0000 mL | Freq: Once | INTRAVENOUS | Status: AC
  Administered 2021-05-18: 500 mL via INTRAVENOUS

## 2021-05-17 NOTE — ED Triage Notes (Addendum)
Pt arrived via EMS from home with [facility].  Pt's family reported to EMS that they checked pt's 02 sat with home o2 sensor and found sats to be 60.  Pt has wet cough.  EMS gave duoneb and solumedrol in route.  Tried pt on 2L [place], sats dropped to 82%.  Pt now on 6L via  with sats 94%.  Pmhx of pacemaker.

## 2021-05-17 NOTE — ED Provider Notes (Signed)
[facility]-EMERGENCY DEPT Provider Note   CSN: [id number] Arrival date & time: 05/17/21  2221     History Chief Complaint  Patient presents with   Shortness of Breath    Justin Richmond is a 87 y.o. male with a history of complete heart block s/p pacemaker, recurrent UTI, osteomyelitis, osteoarthritis, overactive bladder, ILD, HTN, HLD, GERD, chronic diastolic congestive heart failure, who presents to the emergency department by EMS with a chief complaint of shortness of breath.  The patient reports constant, gradually worsening shortness of breath, intermittently productive cough, fever, and chills for the last 3 days.  When EMS arrived the patient's oxygen saturation was 60%.  He was given a DuoNeb and Solu-Medrol in route.  Attempted to trial the patient on 2 L nasal cannula, but his oxygen levels dropped to 82%.  He arrived on 6 L nasal cannula with sats of 94%.  He denies chest pain, leg swelling, orthopnea, headache, nausea, vomiting, diarrhea, abdominal pain, back pain, rash.  He also adds that he has had more difficulty urinating over the last few days.  No dysuria or hematuria.  He has also been having pain in his left hip, but is currently undergoing physical therapy.  He has been taking ibuprofen for his fever, last dose was yesterday.  No other treatment prior to arrival.  The patient attended a family reunion with approximately 40 people last weekend.  He is a DNR.   The history is provided by medical records, the EMS personnel, the patient and a relative. No language interpreter was used.      Past Medical History:  Diagnosis Date   Arrhythmia    Balance problems    Chronic back pain    Chronic constipation    Congestive heart failure (HCC)    Diarrhea    Eczema    Eye disease    cataracts/glaucoma   GERD (gastroesophageal reflux disease)    Hearing loss    Hemorrhoids    High cholesterol    Hypertension    Irregular heartbeat    Joint  pain    Joint stiffness    Migraine headache    Mouth sores    Muscle weakness    OAB (overactive bladder)    Osteoarthritis    Osteomyelitis (HCC)    childhood, left leg   Pacemaker    Poor circulation    Pure hypercholesterolemia    Rectal bleeding    Recurrent UTI    Scoliosis    Shortness of breath    Skin disorder    Spinal stenosis, lumbar region with neurogenic claudication     Patient Active Problem List   Diagnosis Date Noted   CAP (community acquired pneumonia) 05/18/2021   Pressure injury of skin 03/11/2021   Essential hypertension 03/11/2021   Chronic diastolic CHF (congestive heart failure) (HCC) 03/11/2021   GERD (gastroesophageal reflux disease) 03/11/2021   RLS (restless legs syndrome) 03/11/2021   ILD (interstitial lung disease) (HCC) 03/11/2021   History of complete heart block 03/11/2021   UTI (urinary tract infection) 03/10/2021   Peripheral polyneuropathy 04/02/2020   Spinal stenosis of lumbar region with neurogenic claudication 04/02/2020    Past Surgical History:  Procedure Laterality Date   DENTAL SURGERY     tooth removal, root canals   INSERT / REPLACE / REMOVE PACEMAKER     osteomyelitis surgery     x10 surgeries   SPINAL INJECTION     x3  Family History  Problem Relation Age of Onset   Stroke Mother    Other Father        "black lung disease"   Cancer Sister    Ankylosing spondylitis Brother    Asthma Son     Social History   Tobacco Use   Smoking status: Former    Packs/day: 0.25    Years: 2.00    Pack years: 0.50    Types: Cigarettes    Quit date: 05/16/1950    Years since quitting: 71.0   Smokeless tobacco: Never  Vaping Use   Vaping Use: Never used  Substance Use Topics   Alcohol use: Never    Comment: in college   Drug use: Never    Comment: no    Home Medications Prior to Admission medications   Medication Sig Start Date End Date Taking? Authorizing Provider  aspirin EC 81 MG tablet Take 81 mg by  mouth in the morning and at bedtime.    [provider]  Budeson-Glycopyrrol-Formoterol (BREZTRI AEROSPHERE) 160-9-4.8 MCG/ACT AERO Inhale 2 puffs into the lungs 2 (two) times daily. 02/25/21   Mannam, Colbert CoyerPraveen, MD  carvedilol (COREG) 12.5 MG tablet Take 1 tablet (12.5 mg total) by mouth 2 (two) times daily. 03/21/21   Duke SalviaKlein, Steven C, MD  cefUROXime (CEFTIN) 500 MG tablet daily. 03/15/21   [provider]  Coenzyme Q10 (CO Q10) 100 MG CAPS Take 1 capsule by mouth daily.    [provider]  Cyanocobalamin (VITAMIN B-12) 5000 MCG SUBL Place under the tongue.    [provider]  esomeprazole (NEXIUM) 20 MG capsule Take 20 mg by mouth daily at 12 noon.    [provider]  gabapentin (NEURONTIN) 100 MG capsule Take 100 mg by mouth at bedtime. 08/14/20   [provider]  Garlic 500 MG CAPS Take 1 capsule by mouth daily.    [provider]  Magnesium Malate 1250 (141.7 Mg) MG TABS Take 1 tablet by mouth at bedtime.    [provider]  oxybutynin (DITROPAN) 5 MG tablet in the morning and at bedtime. 03/15/21   [provider]  Probiotic Product (DIGESTIVE ADV DIGESTIVE/IMMUNE) CAPS Take 1 capsule by mouth daily.    [provider]  rOPINIRole (REQUIP) 0.25 MG tablet Take 0.25 mg by mouth at bedtime. 02/19/21   [provider]  rosuvastatin (CRESTOR) 10 MG tablet Take 10 mg by mouth daily. 03/05/20   [provider]  Turmeric 450 MG CAPS Take 1 capsule by mouth daily.    [provider]  Vitamin A 7.5 MG (25000 UT) CAPS Take 1 capsule by mouth daily.    [provider]  Vitamin D-Vitamin K (VITAMIN K2-VITAMIN D3 PO) Take 1 capsule by mouth daily.    [provider]    Allergies    Penicillins  Review of Systems   Review of Systems  Constitutional:  Positive for chills and fever. Negative for appetite change.  HENT:  Negative for congestion and sore throat.   Eyes:  Negative  for visual disturbance.  Respiratory:  Positive for cough and shortness of breath.   Cardiovascular:  Negative for chest pain and palpitations.  Gastrointestinal:  Negative for abdominal pain, constipation, diarrhea, nausea and vomiting.  Genitourinary:  Negative for dysuria.  Musculoskeletal:  Negative for back pain and myalgias.  Skin:  Negative for rash.  Allergic/Immunologic: Negative for immunocompromised state.  Neurological:  Negative for dizziness, seizures, syncope, weakness, numbness and headaches.  Psychiatric/Behavioral:  Negative for confusion.    Physical Exam Updated Vital Signs BP (!) 159/82   Pulse 84   Temp (!) 101.7 F (38.7 C) (Rectal)   Resp (!) 24   SpO2 96%   Physical Exam Vitals and nursing note reviewed.  Constitutional:      Appearance: He is well-developed.     Comments: Nasal cannula in place Elderly, nontoxic-appearing male  HENT:     Head: Normocephalic.  Eyes:     Conjunctiva/sclera: Conjunctivae normal.  Cardiovascular:     Rate and Rhythm: Normal rate and regular rhythm.     Pulses: Normal pulses.     Heart sounds: Normal heart sounds. No murmur heard.   No friction rub. No gallop.  Pulmonary:     Effort: Pulmonary effort is normal.     Comments: Diffuse crackles bilaterally with intermittent scattered wheezes bilaterally. He has mild belly breathing, but no intercostal or supracostal retractions.  Mild tachypnea.  Wet cough noted on exam. Abdominal:     General: There is no distension.     Palpations: Abdomen is soft. There is no mass.     Tenderness: There is abdominal tenderness. There is no right CVA tenderness, left CVA tenderness, guarding or rebound.     Hernia: No hernia is present.     Comments: Mild tenderness palpation of suprapubic region.  No rebound or guarding.  Abdomen soft and nondistended.  Normoactive bowel sounds.  Musculoskeletal:     Cervical back: Neck supple.  Skin:    General: Skin is warm and dry.      Coloration: Skin is pale.  Neurological:     Mental Status: He is alert.  Psychiatric:        Behavior: Behavior normal.    ED Results / Procedures / Treatments   Labs (all labs ordered are listed, but only abnormal results are displayed) Labs Reviewed  COMPREHENSIVE METABOLIC PANEL - Abnormal; Notable for the following components:      Result Value   Sodium 132 (*)    Chloride 97 (*)    Glucose, Bld 165 (*)    BUN 28 (*)    Calcium 8.8 (*)    Albumin 3.1 (*)    All other components within normal limits  CBC WITH DIFFERENTIAL/PLATELET - Abnormal; Notable for the following components:   RBC 3.68 (*)    Hemoglobin 11.2 (*)    HCT 34.5 (*)    nRBC 1 (*)    All other components within normal limits  RESP PANEL BY RT-PCR (FLU A&B, COVID) ARPGX2  CULTURE, BLOOD (ROUTINE X 2)  CULTURE, BLOOD (ROUTINE X 2)  CULTURE, BLOOD (ROUTINE X 2)  URINE CULTURE  LACTIC ACID, PLASMA  PROTIME-INR  URINALYSIS, ROUTINE W REFLEX MICROSCOPIC  BRAIN NATRIURETIC PEPTIDE    EKG None  Radiology DG Chest 2 View  Result Date: 05/17/2021 CLINICAL DATA:  Pt arrived via EMS from home with [facility]. Pt has wet cough. Pmhx of pacemaker and urinary retention. Pt states he has had urinary retention for 3 days. Pt denies chest pain, and has a hx of HTN, and CHF. EXAM: CHEST - 2 VIEW COMPARISON:  Chest x-ray 03/10/2021, CT chest 03/14/2021 FINDINGS: The heart size and mediastinal contours are unchanged. Aortic calcification. Left chest wall dual lead cardiac pacemaker in similar position. Biapical pleural/pulmonary scarring. Interval increase in diffuse patchy airspace opacities. Similar appearing coarsened interstitial markings. Possible trace left pleural effusion. No right pleural effusion. No pneumothorax. No acute osseous abnormality.  IMPRESSION: 1. Chronic interstitial lung disease with superimposed infection/inflammation. 2. Possible trace left pleural effusion Electronically Signed   By: Tish Frederickson M.D.    On: 05/17/2021 23:19    Procedures .Critical Care  Date/Time: 05/18/2021 1:25 AM Performed by: Barkley Boards, PA-C Authorized by: Barkley Boards, PA-C   Critical care provider statement:    Critical care time (minutes):  45   Critical care time was exclusive of:  Separately billable procedures and treating other patients and teaching time   Critical care was necessary to treat or prevent imminent or life-threatening deterioration of the following conditions:  Respiratory failure and sepsis   Critical care was time spent personally by me on the following activities:  Ordering and performing treatments and interventions, ordering and review of laboratory studies, ordering and review of radiographic studies, pulse oximetry, re-evaluation of patient's condition, review of old charts, obtaining history from patient or surrogate, examination of patient, evaluation of patient's response to treatment, development of treatment plan with patient or surrogate and discussions with consultants   I assumed direction of critical care for this patient from another provider in my specialty: no     Care discussed with: admitting provider     Medications Ordered in ED Medications  albuterol (VENTOLIN HFA) 108 (90 Base) MCG/ACT inhaler 2 puff (has no administration in time range)  sodium chloride 0.9 % bolus 500 mL (0 mLs Intravenous Stopped 05/18/21 0116)  cefTRIAXone (ROCEPHIN) 1 g in sodium chloride 0.9 % 100 mL IVPB (0 g Intravenous Stopped 05/18/21 0116)  acetaminophen (TYLENOL) tablet 650 mg (650 mg Oral Given 05/18/21 0057)    ED Course  I have reviewed the triage vital signs and the nursing notes.  Pertinent labs & imaging results that were available during my care of the patient were reviewed by me and considered in my medical decision making (see chart for details).  Clinical Course as of 05/18/21 0133  Sat May 18, 2021  0127 On repeat pulmonary exam, wheezes have resolved.  He has crackles in the  left mid lung and base.  Faint crackles in the right lung base.  Work of breathing has improved. [MM]    Clinical Course User Index [MM] Sidi Dzikowski, Coral Else, PA-C   MDM Rules/Calculators/A&P                          87 year old male with a history of complete heart block s/p pacemaker, recurrent UTI, osteomyelitis, osteoarthritis, overactive bladder, ILD, HTN, HLD, GERD, chronic diastolic congestive heart failure who presents the emergency department with 3 days of worsening shortness of breath, cough, fever, and chills.  He is a DNR.  Febrile to 101.7 on arrival.  He has tachypnea.  No tachycardia.  He was found to be hypoxic at 60% at home and arrived via EMS on 6 L nasal cannula.  He was given a DuoNeb and Solu-Medrol in route.  He continued to initially have wheezing and crackles on initial exam, but after he was evaluated by RT he remained on 6 L and wheezes had resolved after repeat DuoNeb.  The patient was seen and independently evaluated by Dr. Judd Lien, attending physician.  Labs and imaging of been reviewed and independently interpreted by me.  Chest x-ray with chronic interstitial lung disease with superimposed infection and inflammation with interval increase in diffuse patchy airspace opacities.  There is also a possible trace left pleural effusion.  No right pleural effusion.  He has  no leukocytosis.  He has mild hyponatremia, but no other metabolic abnormalities.  CBC with no leukocytosis.   Bladder scan performed as triage note had stated the patient had had urinary retention for 3 days.  However, patient reports he has had difficulty urinating for 3 days, but has had no retention.  UA is pending.  Family reports poor p.o. intake.  He was given 500 cc of fluids.  Started on Rocephin for infection.  Consult to the hospitalist team and Dr. Vevelyn Pat accept the patient for admission. The patient appears reasonably stabilized for admission considering the current resources, flow, and  capabilities available in the ED at this time, and I doubt any other [facility] requiring further screening and/or treatment in the ED prior to admission.   Final Clinical Impression(s) / ED Diagnoses Final diagnoses:  Sepsis with acute hypoxic respiratory failure without septic shock, due to unspecified organism [facility])    Rx / DC Orders ED Discharge Orders     None        Meris Reede A, PA-C 05/18/21 0133    Geoffery Lyons, MD 05/18/21 2303

## 2021-05-18 ENCOUNTER — Inpatient Hospital Stay (HOSPITAL_COMMUNITY): Payer: Medicare Other

## 2021-05-18 DIAGNOSIS — E7800 Pure hypercholesterolemia, unspecified: Secondary | ICD-10-CM | POA: Diagnosis present

## 2021-05-18 DIAGNOSIS — A419 Sepsis, unspecified organism: Secondary | ICD-10-CM | POA: Diagnosis present

## 2021-05-18 DIAGNOSIS — R0602 Shortness of breath: Secondary | ICD-10-CM | POA: Diagnosis not present

## 2021-05-18 DIAGNOSIS — Z87891 Personal history of nicotine dependence: Secondary | ICD-10-CM | POA: Diagnosis not present

## 2021-05-18 DIAGNOSIS — I110 Hypertensive heart disease with heart failure: Secondary | ICD-10-CM | POA: Diagnosis present

## 2021-05-18 DIAGNOSIS — L89322 Pressure ulcer of left buttock, stage 2: Secondary | ICD-10-CM | POA: Diagnosis present

## 2021-05-18 DIAGNOSIS — I5023 Acute on chronic systolic (congestive) heart failure: Secondary | ICD-10-CM | POA: Diagnosis not present

## 2021-05-18 DIAGNOSIS — I951 Orthostatic hypotension: Secondary | ICD-10-CM | POA: Diagnosis not present

## 2021-05-18 DIAGNOSIS — I509 Heart failure, unspecified: Secondary | ICD-10-CM

## 2021-05-18 DIAGNOSIS — H9190 Unspecified hearing loss, unspecified ear: Secondary | ICD-10-CM | POA: Diagnosis present

## 2021-05-18 DIAGNOSIS — Z823 Family history of stroke: Secondary | ICD-10-CM | POA: Diagnosis not present

## 2021-05-18 DIAGNOSIS — I361 Nonrheumatic tricuspid (valve) insufficiency: Secondary | ICD-10-CM | POA: Diagnosis not present

## 2021-05-18 DIAGNOSIS — I5043 Acute on chronic combined systolic (congestive) and diastolic (congestive) heart failure: Secondary | ICD-10-CM | POA: Diagnosis present

## 2021-05-18 DIAGNOSIS — J189 Pneumonia, unspecified organism: Secondary | ICD-10-CM

## 2021-05-18 DIAGNOSIS — Z880 Allergy status to penicillin: Secondary | ICD-10-CM | POA: Diagnosis not present

## 2021-05-18 DIAGNOSIS — L89302 Pressure ulcer of unspecified buttock, stage 2: Secondary | ICD-10-CM | POA: Diagnosis not present

## 2021-05-18 DIAGNOSIS — Z87440 Personal history of urinary (tract) infections: Secondary | ICD-10-CM | POA: Diagnosis not present

## 2021-05-18 DIAGNOSIS — J849 Interstitial pulmonary disease, unspecified: Secondary | ICD-10-CM | POA: Diagnosis not present

## 2021-05-18 DIAGNOSIS — N3281 Overactive bladder: Secondary | ICD-10-CM | POA: Diagnosis present

## 2021-05-18 DIAGNOSIS — Z66 Do not resuscitate: Secondary | ICD-10-CM | POA: Diagnosis present

## 2021-05-18 DIAGNOSIS — J9601 Acute respiratory failure with hypoxia: Secondary | ICD-10-CM | POA: Diagnosis not present

## 2021-05-18 DIAGNOSIS — Z7982 Long term (current) use of aspirin: Secondary | ICD-10-CM | POA: Diagnosis not present

## 2021-05-18 DIAGNOSIS — Z825 Family history of asthma and other chronic lower respiratory diseases: Secondary | ICD-10-CM | POA: Diagnosis not present

## 2021-05-18 DIAGNOSIS — Z79899 Other long term (current) drug therapy: Secondary | ICD-10-CM | POA: Diagnosis not present

## 2021-05-18 DIAGNOSIS — G8929 Other chronic pain: Secondary | ICD-10-CM | POA: Diagnosis present

## 2021-05-18 DIAGNOSIS — Z809 Family history of malignant neoplasm, unspecified: Secondary | ICD-10-CM | POA: Diagnosis not present

## 2021-05-18 DIAGNOSIS — K219 Gastro-esophageal reflux disease without esophagitis: Secondary | ICD-10-CM | POA: Diagnosis present

## 2021-05-18 DIAGNOSIS — I442 Atrioventricular block, complete: Secondary | ICD-10-CM | POA: Diagnosis present

## 2021-05-18 DIAGNOSIS — Z20822 Contact with and (suspected) exposure to covid-19: Secondary | ICD-10-CM | POA: Diagnosis present

## 2021-05-18 DIAGNOSIS — R6520 Severe sepsis without septic shock: Secondary | ICD-10-CM | POA: Diagnosis present

## 2021-05-18 DIAGNOSIS — J9621 Acute and chronic respiratory failure with hypoxia: Secondary | ICD-10-CM | POA: Diagnosis present

## 2021-05-18 DIAGNOSIS — L89150 Pressure ulcer of sacral region, unstageable: Secondary | ICD-10-CM | POA: Insufficient documentation

## 2021-05-18 DIAGNOSIS — Z950 Presence of cardiac pacemaker: Secondary | ICD-10-CM | POA: Diagnosis not present

## 2021-05-18 LAB — ECHOCARDIOGRAM COMPLETE
AR max vel: 2.26 cm2
AV Area VTI: 2.39 cm2
AV Area mean vel: 2.24 cm2
AV Mean grad: 6 mmHg
AV Peak grad: 11.6 mmHg
Ao pk vel: 1.7 m/s
Area-P 1/2: 4.96 cm2
Calc EF: 36.5 %
MV M vel: 4.64 m/s
MV Peak grad: 86.1 mmHg
P 1/2 time: 479 msec
S' Lateral: 4 cm
Single Plane A2C EF: 43.1 %
Single Plane A4C EF: 31.1 %

## 2021-05-18 LAB — BRAIN NATRIURETIC PEPTIDE: B Natriuretic Peptide: 690.4 pg/mL — ABNORMAL HIGH (ref 0.0–100.0)

## 2021-05-18 LAB — URINALYSIS, ROUTINE W REFLEX MICROSCOPIC
Bilirubin Urine: NEGATIVE
Glucose, UA: NEGATIVE mg/dL
Hgb urine dipstick: NEGATIVE
Ketones, ur: NEGATIVE mg/dL
Leukocytes,Ua: NEGATIVE
Nitrite: NEGATIVE
Protein, ur: 100 mg/dL — AB
Specific Gravity, Urine: 1.012 (ref 1.005–1.030)
pH: 5 (ref 5.0–8.0)

## 2021-05-18 LAB — PROCALCITONIN: Procalcitonin: 1.25 ng/mL

## 2021-05-18 LAB — CBC WITH DIFFERENTIAL/PLATELET
Band Neutrophils: 17 %
Basophils Relative: 0 %
Blasts: NONE SEEN %
Eosinophils Relative: 0 %
HCT: 34.5 % — ABNORMAL LOW (ref 39.0–52.0)
Hemoglobin: 11.2 g/dL — ABNORMAL LOW (ref 13.0–17.0)
Lymphocytes Relative: 27 %
MCH: 30.4 pg (ref 26.0–34.0)
MCHC: 32.5 g/dL (ref 30.0–36.0)
MCV: 93.8 fL (ref 80.0–100.0)
Metamyelocytes Relative: NONE SEEN %
Monocytes Relative: 11 %
Myelocytes: NONE SEEN %
Neutrophils Relative %: 45 %
Platelets: 195 10*3/uL (ref 150–400)
Promyelocytes Relative: NONE SEEN %
RBC Morphology: NORMAL
RBC: 3.68 MIL/uL — ABNORMAL LOW (ref 4.22–5.81)
RDW: 13.8 % (ref 11.5–15.5)
WBC: 5.9 10*3/uL (ref 4.0–10.5)
nRBC: 0 % (ref 0.0–0.2)
nRBC: 1 /100 WBC — ABNORMAL HIGH

## 2021-05-18 MED ORDER — SODIUM CHLORIDE 0.9 % IV SOLN
500.0000 mg | INTRAVENOUS | Status: DC
  Administered 2021-05-18 – 2021-05-23 (×6): 500 mg via INTRAVENOUS
  Filled 2021-05-18 (×6): qty 500

## 2021-05-18 MED ORDER — ALBUTEROL SULFATE (2.5 MG/3ML) 0.083% IN NEBU: 2.5000 mg | INHALATION_SOLUTION | RESPIRATORY_TRACT | Status: DC | PRN

## 2021-05-18 MED ORDER — IPRATROPIUM-ALBUTEROL 0.5-2.5 (3) MG/3ML IN SOLN
3.0000 mL | Freq: Four times a day (QID) | RESPIRATORY_TRACT | Status: DC
  Administered 2021-05-18 – 2021-05-21 (×12): 3 mL via RESPIRATORY_TRACT
  Filled 2021-05-18 (×11): qty 3

## 2021-05-18 MED ORDER — SODIUM CHLORIDE 0.9 % IV SOLN
1.0000 g | INTRAVENOUS | Status: AC
  Administered 2021-05-19 – 2021-05-24 (×6): 1 g via INTRAVENOUS
  Filled 2021-05-18 (×2): qty 1
  Filled 2021-05-18: qty 10
  Filled 2021-05-18 (×3): qty 1

## 2021-05-18 MED ORDER — METHYLPREDNISOLONE SODIUM SUCC 125 MG IJ SOLR
60.0000 mg | Freq: Two times a day (BID) | INTRAMUSCULAR | Status: DC
  Administered 2021-05-18 – 2021-05-22 (×9): 60 mg via INTRAVENOUS
  Filled 2021-05-18 (×9): qty 2

## 2021-05-18 MED ORDER — ENOXAPARIN SODIUM 40 MG/0.4ML IJ SOSY
40.0000 mg | PREFILLED_SYRINGE | INTRAMUSCULAR | Status: DC
  Administered 2021-05-18 – 2021-06-03 (×17): 40 mg via SUBCUTANEOUS
  Filled 2021-05-18 (×18): qty 0.4

## 2021-05-18 MED ORDER — ENSURE ENLIVE PO LIQD
237.0000 mL | Freq: Two times a day (BID) | ORAL | Status: DC
  Administered 2021-05-19 – 2021-06-03 (×25): 237 mL via ORAL

## 2021-05-18 MED ORDER — ACETAMINOPHEN 325 MG PO TABS
650.0000 mg | ORAL_TABLET | Freq: Once | ORAL | Status: AC
  Administered 2021-05-18: 650 mg via ORAL
  Filled 2021-05-18: qty 2

## 2021-05-18 MED ORDER — ACETAMINOPHEN 650 MG RE SUPP: 650.0000 mg | Freq: Four times a day (QID) | RECTAL | Status: DC | PRN

## 2021-05-18 MED ORDER — ACETAMINOPHEN 325 MG PO TABS
650.0000 mg | ORAL_TABLET | Freq: Four times a day (QID) | ORAL | Status: DC | PRN
  Administered 2021-05-21 – 2021-06-03 (×9): 650 mg via ORAL
  Filled 2021-05-18 (×10): qty 2

## 2021-05-18 MED ORDER — FUROSEMIDE 10 MG/ML IJ SOLN
40.0000 mg | Freq: Once | INTRAMUSCULAR | Status: AC
  Administered 2021-05-18: 40 mg via INTRAVENOUS
  Filled 2021-05-18: qty 4

## 2021-05-18 NOTE — Progress Notes (Signed)
*  PRELIMINARY RESULTS* Echocardiogram 2D Echocardiogram has been performed.  Neomia Dear RDCS 05/18/2021, 8:56 AM

## 2021-05-18 NOTE — Progress Notes (Addendum)
PROGRESS NOTE  Justin Richmond LEX:[id number] DOB: [birth year] DOA: 05/17/2021 PCP: Johny Blamer, MD  HPI/Recap of past 52 hours: 87 year old male admitted to the hospital for evaluation of worsening shortness of breath cough fever and chills for 3 days.  She has a past medical history significant for interstitial lung disease CHF hypertension hyperlipidemia overactive bladder recurrent UTI May 18, 2021: Patient seen and examined at bedside his son-in-law is at bedside he denies any new complaints.  Currently on oxygen at 5 L it was at 6 L when he first presented to the emergency room  Assessment/Plan: Principal Problem:   CAP (community acquired pneumonia) Active Problems:   ILD (interstitial lung disease) (HCC)   Acute hypoxemic respiratory failure (HCC)   Sepsis (HCC)   CHF exacerbation (HCC)  1.  Community-acquired pneumonia.  Patient will continue IV ceftriaxone and azithromycin.  And Solu-Medrol he was febrile and tachypneic on presentation to the ED.  His PCR influenza and COVID-19 were both negative  2.  Acute hypoxic respiratory failure requiring currently following 5 L/min of oxygen.  His initial oxygenation was 60% on room air with EMS.  Patient was not on home oxygen  3.  Sepsis due to community-acquired pneumonia.  Lactic acid was elevated but now resolved  patient will continue IV rehydration and antibiotics  4.  Hypertension.  He was initially normotensive his blood pressure is getting little higher Continue home Coreg We will monitor it  5.  Acute on chronic systolic CHF.  He had elevated BNP in the emergency room he was given 1 dose of IV Lasix.  Patient was not on home Lasix from review of his medications.  We will reassess in the morning to see if he is needing to be restarted on routine IV or p.o. diuretic  6.  Hyperlipidemia continue rosuvastatin  Code Status: DNR  Severity of Illness: The appropriate patient status for this patient is INPATIENT. Inpatient  status is judged to be reasonable and necessary in order to provide the required intensity of service to ensure the patient's safety. The patient's presenting symptoms, physical exam findings, and initial radiographic and laboratory data in the context of their chronic comorbidities is felt to place them at high risk for further clinical deterioration. Furthermore, it is not anticipated that the patient will be medically stable for discharge from the hospital within 2 midnights of admission. The following factors support the patient status of inpatient.  Hypoxia requiring initially 6 L of oxygen per minute   * I certify that at the point of admission it is my clinical judgment that the patient will require inpatient hospital care spanning beyond 2 midnights from the point of admission due to high intensity of service, high risk for further deterioration and high frequency of surveillance required.*   Family Communication: Son in law Loistine Chance at bedside  Disposition Plan: To be determined   Consultants: None  Procedures: None  Antimicrobials: Azithromycin Ceftriaxone   DVT prophylaxis: Lovenox   Objective: Vitals:   05/18/21 0630 05/18/21 0634 05/18/21 0645 05/18/21 0930  BP: (!) 147/84   (!) 170/98  Pulse: 79 77 66 79  Resp: (!) 26 (!) 27 18 (!) 27  Temp:      TempSrc:      SpO2: (!) 86% 91% 95% 97%    Intake/Output Summary (Last 24 hours) at 05/18/2021 0945 Last data filed at 05/18/2021 0542 Gross per 24 hour  Intake --  Output 575 ml  Net -575 ml  There were no vitals filed for this visit. There is no height or weight on file to calculate BMI.  Exam:  General: 87 y.o. year-old male well developed frail looking n no acute distress.  Alert and oriented x3. Cardiovascular: Regular rate and rhythm with no rubs or gallops.  No thyromegaly or JVD noted.   Respiratory: Bilateral rales. Good inspiratory effort. Abdomen: Soft nontender nondistended with normal bowel sounds  x4 quadrants. Musculoskeletal: No lower extremity edema. 2/4 pulses in all 4 extremities. Skin: No ulcerative lesions noted or rashes, Psychiatry: Mood is appropriate for condition and setting    Data Reviewed: CBC: Recent Labs  Lab 05/17/21 2244  WBC 5.9  HGB 11.2*  HCT 34.5*  MCV 93.8  PLT 195   Basic Metabolic Panel: Recent Labs  Lab 05/17/21 2244  NA 132*  K 4.1  CL 97*  CO2 25  GLUCOSE 165*  BUN 28*  CREATININE 0.92  CALCIUM 8.8*   GFR: CrCl cannot be calculated (Unknown ideal weight.). Liver Function Tests: Recent Labs  Lab 05/17/21 2244  AST 39  ALT 27  ALKPHOS 55  BILITOT 1.0  PROT 7.6  ALBUMIN 3.1*   No results for input(s): LIPASE, AMYLASE in the last 168 hours. No results for input(s): AMMONIA in the last 168 hours. Coagulation Profile: Recent Labs  Lab 05/17/21 2244  INR 1.1   Cardiac Enzymes: No results for input(s): CKTOTAL, CKMB, CKMBINDEX, TROPONINI in the last 168 hours. BNP (last 3 results) No results for input(s): PROBNP in the last 8760 hours. HbA1C: No results for input(s): HGBA1C in the last 72 hours. CBG: No results for input(s): GLUCAP in the last 168 hours. Lipid Profile: No results for input(s): CHOL, HDL, LDLCALC, TRIG, CHOLHDL, LDLDIRECT in the last 72 hours. Thyroid Function Tests: No results for input(s): TSH, T4TOTAL, FREET4, T3FREE, THYROIDAB in the last 72 hours. Anemia Panel: No results for input(s): VITAMINB12, FOLATE, FERRITIN, TIBC, IRON, RETICCTPCT in the last 72 hours. Urine analysis:    Component Value Date/Time   COLORURINE YELLOW 05/18/2021 0541   APPEARANCEUR CLEAR 05/18/2021 0541   LABSPEC 1.012 05/18/2021 0541   PHURINE 5.0 05/18/2021 0541   GLUCOSEU NEGATIVE 05/18/2021 0541   HGBUR NEGATIVE 05/18/2021 0541   BILIRUBINUR NEGATIVE 05/18/2021 0541   KETONESUR NEGATIVE 05/18/2021 0541   PROTEINUR 100 (A) 05/18/2021 0541   NITRITE NEGATIVE 05/18/2021 0541   LEUKOCYTESUR NEGATIVE 05/18/2021 0541    Sepsis Labs: @LABRCNTIP (procalcitonin:4,lacticidven:4)  ) Recent Results (from the past 240 hour(s))  Resp Panel by RT-PCR (Flu A&B, Covid) Nasopharyngeal Swab     Status: None   Collection Time: 05/17/21 10:51 PM   Specimen: Nasopharyngeal Swab; Nasopharyngeal(NP) swabs in vial transport medium  Result Value Ref Range Status   SARS Coronavirus 2 by RT PCR NEGATIVE NEGATIVE Final    Comment: (NOTE) SARS-CoV-2 target nucleic acids are NOT DETECTED.  The SARS-CoV-2 RNA is generally detectable in upper respiratory specimens during the acute phase of infection. The lowest concentration of SARS-CoV-2 viral copies this assay can detect is 138 copies/mL. A negative result does not preclude SARS-Cov-2 infection and should not be used as the sole basis for treatment or other patient management decisions. A negative result may occur with  improper specimen collection/handling, submission of specimen other than nasopharyngeal swab, presence of viral mutation(s) within the areas targeted by this assay, and inadequate number of viral copies(<138 copies/mL). A negative result must be combined with clinical observations, patient history, and epidemiological information. The expected result is Negative.  Fact Sheet for Patients:  BloggerCourse.com  Fact Sheet for Healthcare Providers:  SeriousBroker.it  This test is no t yet approved or cleared by the [place] FDA and  has been authorized for detection and/or diagnosis of SARS-CoV-2 by FDA under an Emergency Use Authorization (EUA). This EUA will remain  in effect (meaning this test can be used) for the duration of the COVID-19 declaration under Section 564(b)(1) of the Act, 21 U.S.C.section 360bbb-3(b)(1), unless the authorization is terminated  or revoked sooner.       Influenza A by PCR NEGATIVE NEGATIVE Final   Influenza B by PCR NEGATIVE NEGATIVE Final    Comment: (NOTE) The  Xpert Xpress SARS-CoV-2/FLU/RSV plus assay is intended as an aid in the diagnosis of influenza from Nasopharyngeal swab specimens and should not be used as a sole basis for treatment. Nasal washings and aspirates are unacceptable for Xpert Xpress SARS-CoV-2/FLU/RSV testing.  Fact Sheet for Patients: BloggerCourse.com  Fact Sheet for Healthcare Providers: SeriousBroker.it  This test is not yet approved or cleared by the [place] FDA and has been authorized for detection and/or diagnosis of SARS-CoV-2 by FDA under an Emergency Use Authorization (EUA). This EUA will remain in effect (meaning this test can be used) for the duration of the COVID-19 declaration under Section 564(b)(1) of the Act, 21 U.S.C. section 360bbb-3(b)(1), unless the authorization is terminated or revoked.  Performed at [facility], [address], [place], [place] [address]       Studies: DG Chest 2 View  Result Date: 05/17/2021 CLINICAL DATA:  Pt arrived via EMS from home with [facility]. Pt has wet cough. Pmhx of pacemaker and urinary retention. Pt states he has had urinary retention for 3 days. Pt denies chest pain, and has a hx of HTN, and CHF. EXAM: CHEST - 2 VIEW COMPARISON:  Chest x-ray 03/10/2021, CT chest 03/14/2021 FINDINGS: The heart size and mediastinal contours are unchanged. Aortic calcification. Left chest wall dual lead cardiac pacemaker in similar position. Biapical pleural/pulmonary scarring. Interval increase in diffuse patchy airspace opacities. Similar appearing coarsened interstitial markings. Possible trace left pleural effusion. No right pleural effusion. No pneumothorax. No acute osseous abnormality. IMPRESSION: 1. Chronic interstitial lung disease with superimposed infection/inflammation. 2. Possible trace left pleural effusion Electronically Signed   By: Tish Frederickson M.D.   On: 05/17/2021 23:19    Scheduled Meds:   enoxaparin (LOVENOX) injection  40 mg Subcutaneous Q24H   ipratropium-albuterol  3 mL Nebulization Q6H   methylPREDNISolone (SOLU-MEDROL) injection  60 mg Intravenous Q12H    Continuous Infusions:  azithromycin 500 mg (05/18/21 0441)   [START ON 05/19/2021] cefTRIAXone (ROCEPHIN)  IV       LOS: 0 days     Myrtie Neither, MD [facility]  To reach me or the doctor on call, go to: [email or web address] Password [email or web address]  05/18/2021, 9:45 AM

## 2021-05-18 NOTE — H&P (Signed)
History and Physical    Justin Richmond KZL:[id number] DOB: [birth year] DOA: 05/17/2021  PCP: Johny Blamer, MD Patient coming from: Home  Chief Complaint: Shortness of breath  HPI: Justin Richmond is a 87 y.o. male with medical history significant of ILD, CHB status post PPM, CHF, hypertension, hyperlipidemia, overactive bladder, recurrent UTI presented to the ED via EMS for evaluation of worsening shortness of breath, cough, fever, and chills x3 days.  He was satting 60% on room air with EMS and sats improved with 6 L supplemental oxygen.  He was given DuoNeb and Solu-Medrol in route.  Initially had wheezing and crackles on exam and was given a DuoNeb treatment in the ED.  Triage note mentioned urinary retention but bladder scan was done in the ED which did not show any signs of acute urinary retention.  He was febrile in the ED with temperature 101.7 F.  Tachypneic but not tachycardic.  Not hypotensive.  Stable on 6 L supplemental oxygen.  Labs showing WBC 5.9, hemoglobin 11.2 (stable), platelet count 195K.  Sodium 132, potassium 4.1, chloride 97, bicarb 25, BUN 28, creatinine 0.9, glucose 165.  LFTs normal.  Lactic acid 1.3.  INR 1.1.  Blood culture x2 pending.  BNP 690.  UA and urine culture pending.  COVID and influenza PCR negative.  Chest x-ray showing chronic interstitial lung disease with interval increase in diffuse patchy airspace opacities, possible trace left pleural effusion. Patient was given Tylenol, ceftriaxone, and 500 cc normal saline bolus. ED PA spoke to the patient's family who confirmed that he is a DNR.  History provided mostly by patient's son-in-law at bedside who states for the past 3 days family has noticed that patient has had difficulty breathing.  He is not coughing much.  Today his oxygen saturation was down to 80s with home health RN and later down to the 70s.  He does not use oxygen at home.  Patient is endorsing orthopnea.  Denies chest pain.  No other  complaints.  Review of Systems:  All systems reviewed and apart from history of presenting illness, are negative.  Past Medical History:  Diagnosis Date   Arrhythmia    Balance problems    Chronic back pain    Chronic constipation    Congestive heart failure (HCC)    Diarrhea    Eczema    Eye disease    cataracts/glaucoma   GERD (gastroesophageal reflux disease)    Hearing loss    Hemorrhoids    High cholesterol    Hypertension    Irregular heartbeat    Joint pain    Joint stiffness    Migraine headache    Mouth sores    Muscle weakness    OAB (overactive bladder)    Osteoarthritis    Osteomyelitis (HCC)    childhood, left leg   Pacemaker    Poor circulation    Pure hypercholesterolemia    Rectal bleeding    Recurrent UTI    Scoliosis    Shortness of breath    Skin disorder    Spinal stenosis, lumbar region with neurogenic claudication     Past Surgical History:  Procedure Laterality Date   DENTAL SURGERY     tooth removal, root canals   INSERT / REPLACE / REMOVE PACEMAKER     osteomyelitis surgery     x10 surgeries   SPINAL INJECTION     x3      reports that he quit smoking about 71 years ago. His smoking use  included cigarettes. He has a 0.50 pack-year smoking history. He has never used smokeless tobacco. He reports that he does not drink alcohol and does not use drugs.  Allergies  Allergen Reactions   Penicillins     Unknown reaction, "it's been a long time".    Family History  Problem Relation Age of Onset   Stroke Mother    Other Father        "black lung disease"   Cancer Sister    Ankylosing spondylitis Brother    Asthma Son     Prior to Admission medications   Medication Sig Start Date End Date Taking? Authorizing Provider  aspirin EC 81 MG tablet Take 81 mg by mouth in the morning and at bedtime.    [provider]  Budeson-Glycopyrrol-Formoterol (BREZTRI AEROSPHERE) 160-9-4.8 MCG/ACT AERO Inhale 2 puffs into the lungs 2 (two)  times daily. 02/25/21   Mannam, Colbert CoyerPraveen, MD  carvedilol (COREG) 12.5 MG tablet Take 1 tablet (12.5 mg total) by mouth 2 (two) times daily. 03/21/21   Duke SalviaKlein, Steven C, MD  cefUROXime (CEFTIN) 500 MG tablet daily. 03/15/21   [provider]  Coenzyme Q10 (CO Q10) 100 MG CAPS Take 1 capsule by mouth daily.    [provider]  Cyanocobalamin (VITAMIN B-12) 5000 MCG SUBL Place under the tongue.    [provider]  esomeprazole (NEXIUM) 20 MG capsule Take 20 mg by mouth daily at 12 noon.    [provider]  gabapentin (NEURONTIN) 100 MG capsule Take 100 mg by mouth at bedtime. 08/14/20   [provider]  Garlic 500 MG CAPS Take 1 capsule by mouth daily.    [provider]  Magnesium Malate 1250 (141.7 Mg) MG TABS Take 1 tablet by mouth at bedtime.    [provider]  oxybutynin (DITROPAN) 5 MG tablet in the morning and at bedtime. 03/15/21   [provider]  Probiotic Product (DIGESTIVE ADV DIGESTIVE/IMMUNE) CAPS Take 1 capsule by mouth daily.    [provider]  rOPINIRole (REQUIP) 0.25 MG tablet Take 0.25 mg by mouth at bedtime. 02/19/21   [provider]  rosuvastatin (CRESTOR) 10 MG tablet Take 10 mg by mouth daily. 03/05/20   [provider]  Turmeric 450 MG CAPS Take 1 capsule by mouth daily.    [provider]  Vitamin A 7.5 MG (25000 UT) CAPS Take 1 capsule by mouth daily.    [provider]  Vitamin D-Vitamin K (VITAMIN K2-VITAMIN D3 PO) Take 1 capsule by mouth daily.    [provider]    Physical Exam: Vitals:   05/18/21 0229 05/18/21 0230 05/18/21 0245 05/18/21 0300  BP:  (!) 141/73  138/70  Pulse:  73 73 71  Resp:  19 17 16   Temp: 97.9 F (36.6 C)     TempSrc: Oral     SpO2:  96% 96% 96%    Physical Exam Constitutional:      General: He is not in acute distress. HENT:     Head: Normocephalic and atraumatic.  Eyes:     Extraocular Movements: Extraocular  movements intact.     Conjunctiva/sclera: Conjunctivae normal.  Neck:     Comments: JVD present Cardiovascular:     Rate and Rhythm: Normal rate and regular rhythm.     Pulses: Normal pulses.  Pulmonary:     Effort: Pulmonary effort is normal.     Breath sounds: No wheezing.     Comments: Diffuse crackles Abdominal:  General: Bowel sounds are normal. There is no distension.     Palpations: Abdomen is soft.     Tenderness: There is no abdominal tenderness.  Musculoskeletal:     Comments: Trace bilateral pedal edema  Skin:    General: Skin is warm and dry.  Neurological:     Mental Status: He is alert.     Labs on Admission: I have personally reviewed following labs and imaging studies  CBC: Recent Labs  Lab 05/17/21 2244  WBC 5.9  HGB 11.2*  HCT 34.5*  MCV 93.8  PLT 195   Basic Metabolic Panel: Recent Labs  Lab 05/17/21 2244  NA 132*  K 4.1  CL 97*  CO2 25  GLUCOSE 165*  BUN 28*  CREATININE 0.92  CALCIUM 8.8*   GFR: CrCl cannot be calculated (Unknown ideal weight.). Liver Function Tests: Recent Labs  Lab 05/17/21 2244  AST 39  ALT 27  ALKPHOS 55  BILITOT 1.0  PROT 7.6  ALBUMIN 3.1*   No results for input(s): LIPASE, AMYLASE in the last 168 hours. No results for input(s): AMMONIA in the last 168 hours. Coagulation Profile: Recent Labs  Lab 05/17/21 2244  INR 1.1   Cardiac Enzymes: No results for input(s): CKTOTAL, CKMB, CKMBINDEX, TROPONINI in the last 168 hours. BNP (last 3 results) No results for input(s): PROBNP in the last 8760 hours. HbA1C: No results for input(s): HGBA1C in the last 72 hours. CBG: No results for input(s): GLUCAP in the last 168 hours. Lipid Profile: No results for input(s): CHOL, HDL, LDLCALC, TRIG, CHOLHDL, LDLDIRECT in the last 72 hours. Thyroid Function Tests: No results for input(s): TSH, T4TOTAL, FREET4, T3FREE, THYROIDAB in the last 72 hours. Anemia Panel: No results for input(s): VITAMINB12, FOLATE,  FERRITIN, TIBC, IRON, RETICCTPCT in the last 72 hours. Urine analysis:    Component Value Date/Time   COLORURINE YELLOW 03/10/2021 1230   APPEARANCEUR HAZY (A) 03/10/2021 1230   LABSPEC 1.008 03/10/2021 1230   PHURINE 6.0 03/10/2021 1230   GLUCOSEU NEGATIVE 03/10/2021 1230   HGBUR LARGE (A) 03/10/2021 1230   BILIRUBINUR NEGATIVE 03/10/2021 1230   KETONESUR NEGATIVE 03/10/2021 1230   PROTEINUR 30 (A) 03/10/2021 1230   NITRITE NEGATIVE 03/10/2021 1230   LEUKOCYTESUR LARGE (A) 03/10/2021 1230    Radiological Exams on Admission: DG Chest 2 View  Result Date: 05/17/2021 CLINICAL DATA:  Pt arrived via EMS from home with [facility]. Pt has wet cough. Pmhx of pacemaker and urinary retention. Pt states he has had urinary retention for 3 days. Pt denies chest pain, and has a hx of HTN, and CHF. EXAM: CHEST - 2 VIEW COMPARISON:  Chest x-ray 03/10/2021, CT chest 03/14/2021 FINDINGS: The heart size and mediastinal contours are unchanged. Aortic calcification. Left chest wall dual lead cardiac pacemaker in similar position. Biapical pleural/pulmonary scarring. Interval increase in diffuse patchy airspace opacities. Similar appearing coarsened interstitial markings. Possible trace left pleural effusion. No right pleural effusion. No pneumothorax. No acute osseous abnormality. IMPRESSION: 1. Chronic interstitial lung disease with superimposed infection/inflammation. 2. Possible trace left pleural effusion Electronically Signed   By: Tish Frederickson M.D.   On: 05/17/2021 23:19    EKG: Independently reviewed.  Interpretation limited secondary to paced rhythm.  Assessment/Plan Principal Problem:   CAP (community acquired pneumonia) Active Problems:   ILD (interstitial lung disease) (HCC)   Acute hypoxemic respiratory failure (HCC)   Sepsis (HCC)   CHF exacerbation (HCC)   Acute hypoxemic respiratory failure likely multifactorial due to community-acquired pneumonia, ILD  flare, and acute on chronic diastolic  CHF Sepsis due to community-acquired pneumonia Satting 60% on room air with EMS, currently stable on 6 L supplemental oxygen.  Febrile and tachypneic on arrival to the ED.  He was wheezing and was given a DuoNeb treatment.  Chest x-ray showing chronic interstitial lung disease with interval increase in diffuse patchy airspace opacities, possible trace left pleural effusion.  COVID and influenza PCR negative.  Labs showing no leukocytosis or lactic acidosis.  BNP elevated at 690.  Prior echo done in August 2019 showing normal LVEF but impaired relaxation, mild aortic regurgitation. -Ceftriaxone, azithromycin, IV Solu-Medrol 60 mg every 12 hours, DuoNeb every 6 hours, albuterol neb as needed.  Check procalcitonin level.  Blood culture x2 pending.  Give IV Lasix 40 mg x 1, reassess in the morning and order additional doses as needed.  Echocardiogram ordered.  Monitor intake and output, daily weights, dietary fluid restriction.  Continue supplemental oxygen, wean as tolerated.  Hypertension Currently normotensive. -Resume Coreg after pharmacy med rec is done.  Hyperlipidemia -Resume statin after pharmacy med rec is done.  Overactive bladder -Resume oxybutynin after pharmacy med rec is done.  DVT prophylaxis: Lovenox Code Status: DNR-confirmed with family. Family Communication: Son-in-law at bedside. Disposition Plan: Status is: Inpatient  Remains inpatient appropriate because:Inpatient level of care appropriate due to severity of illness  Dispo: The patient is from: Home              Anticipated d/c is to: Home              Patient currently is not medically stable to d/c.   Difficult to place patient No  Level of care: Level of care: Progressive  The medical decision making on this patient was of high complexity and the patient is at high risk for clinical deterioration, therefore this is a level 3 visit.  John Giovanni MD [facility]  If 7PM-7AM, please contact  night-coverage [email or web address]  05/18/2021, 3:45 AM

## 2021-05-19 ENCOUNTER — Inpatient Hospital Stay (HOSPITAL_COMMUNITY): Payer: Medicare Other

## 2021-05-19 DIAGNOSIS — J849 Interstitial pulmonary disease, unspecified: Secondary | ICD-10-CM

## 2021-05-19 DIAGNOSIS — J189 Pneumonia, unspecified organism: Secondary | ICD-10-CM | POA: Diagnosis not present

## 2021-05-19 DIAGNOSIS — L8990 Pressure ulcer of unspecified site, unspecified stage: Secondary | ICD-10-CM | POA: Insufficient documentation

## 2021-05-19 DIAGNOSIS — L89302 Pressure ulcer of unspecified buttock, stage 2: Secondary | ICD-10-CM | POA: Diagnosis present

## 2021-05-19 DIAGNOSIS — I5023 Acute on chronic systolic (congestive) heart failure: Secondary | ICD-10-CM | POA: Diagnosis not present

## 2021-05-19 DIAGNOSIS — K649 Unspecified hemorrhoids: Secondary | ICD-10-CM | POA: Diagnosis present

## 2021-05-19 DIAGNOSIS — L89002 Pressure ulcer of unspecified elbow, stage 2: Secondary | ICD-10-CM

## 2021-05-19 DIAGNOSIS — K641 Second degree hemorrhoids: Secondary | ICD-10-CM

## 2021-05-19 DIAGNOSIS — J9601 Acute respiratory failure with hypoxia: Secondary | ICD-10-CM | POA: Diagnosis not present

## 2021-05-19 LAB — CBC WITH DIFFERENTIAL/PLATELET
Abs Immature Granulocytes: 0.1 10*3/uL — ABNORMAL HIGH (ref 0.00–0.07)
Basophils Absolute: 0.1 10*3/uL (ref 0.0–0.1)
Basophils Relative: 1 %
Eosinophils Absolute: 0 10*3/uL (ref 0.0–0.5)
Eosinophils Relative: 0 %
HCT: 35.8 % — ABNORMAL LOW (ref 39.0–52.0)
Hemoglobin: 11.6 g/dL — ABNORMAL LOW (ref 13.0–17.0)
Immature Granulocytes: 1 %
Lymphocytes Relative: 7 %
Lymphs Abs: 0.7 10*3/uL (ref 0.7–4.0)
MCH: 29.8 pg (ref 26.0–34.0)
MCHC: 32.4 g/dL (ref 30.0–36.0)
MCV: 92 fL (ref 80.0–100.0)
Monocytes Absolute: 0.6 10*3/uL (ref 0.1–1.0)
Monocytes Relative: 6 %
Neutro Abs: 9 10*3/uL — ABNORMAL HIGH (ref 1.7–7.7)
Neutrophils Relative %: 85 %
Platelets: 226 10*3/uL (ref 150–400)
RBC: 3.89 MIL/uL — ABNORMAL LOW (ref 4.22–5.81)
RDW: 13.7 % (ref 11.5–15.5)
WBC: 10.5 10*3/uL (ref 4.0–10.5)
nRBC: 0 % (ref 0.0–0.2)

## 2021-05-19 LAB — URINE CULTURE: Culture: <10000 — AB

## 2021-05-19 LAB — BASIC METABOLIC PANEL
Anion gap: 9 (ref 5–15)
BUN: 31 mg/dL — ABNORMAL HIGH (ref 8–23)
CO2: 29 mmol/L (ref 22–32)
Calcium: 8.9 mg/dL (ref 8.9–10.3)
Chloride: 99 mmol/L (ref 98–111)
Creatinine, Ser: 0.82 mg/dL (ref 0.61–1.24)
GFR, Estimated: >60 mL/min (ref >60)
Glucose, Bld: 157 mg/dL — ABNORMAL HIGH (ref 70–99)
Potassium: 3.5 mmol/L (ref 3.5–5.1)
Sodium: 137 mmol/L (ref 135–145)

## 2021-05-19 MED ORDER — PANTOPRAZOLE SODIUM 20 MG PO TBEC
20.0000 mg | DELAYED_RELEASE_TABLET | Freq: Every day | ORAL | Status: DC
  Administered 2021-05-19 – 2021-06-03 (×16): 20 mg via ORAL
  Filled 2021-05-19 (×16): qty 1

## 2021-05-19 MED ORDER — DOCUSATE SODIUM 100 MG PO CAPS
100.0000 mg | ORAL_CAPSULE | Freq: Two times a day (BID) | ORAL | Status: DC
  Administered 2021-05-19 – 2021-06-03 (×28): 100 mg via ORAL
  Filled 2021-05-19 (×32): qty 1

## 2021-05-19 MED ORDER — ROSUVASTATIN CALCIUM 10 MG PO TABS
10.0000 mg | ORAL_TABLET | Freq: Every day | ORAL | Status: DC
  Administered 2021-05-19 – 2021-06-03 (×15): 10 mg via ORAL
  Filled 2021-05-19 (×16): qty 1

## 2021-05-19 MED ORDER — ROPINIROLE HCL 0.25 MG PO TABS
0.2500 mg | ORAL_TABLET | Freq: Every day | ORAL | Status: DC
  Administered 2021-05-19 – 2021-06-03 (×15): 0.25 mg via ORAL
  Filled 2021-05-19 (×16): qty 1

## 2021-05-19 MED ORDER — CARVEDILOL 12.5 MG PO TABS
12.5000 mg | ORAL_TABLET | Freq: Two times a day (BID) | ORAL | Status: DC
  Administered 2021-05-19 – 2021-05-24 (×10): 12.5 mg via ORAL
  Filled 2021-05-19 (×10): qty 1

## 2021-05-19 MED ORDER — WITCH HAZEL-GLYCERIN EX PADS
MEDICATED_PAD | CUTANEOUS | Status: DC | PRN
  Filled 2021-05-19: qty 100

## 2021-05-19 MED ORDER — MAGNESIUM GLUCONATE 500 MG PO TABS
500.0000 mg | ORAL_TABLET | Freq: Every day | ORAL | Status: DC
  Administered 2021-05-19 – 2021-06-03 (×15): 500 mg via ORAL
  Filled 2021-05-19 (×16): qty 1

## 2021-05-19 MED ORDER — HYDROCORTISONE ACETATE 25 MG RE SUPP
25.0000 mg | Freq: Two times a day (BID) | RECTAL | Status: DC
  Administered 2021-05-19 – 2021-06-03 (×21): 25 mg via RECTAL
  Filled 2021-05-19 (×34): qty 1

## 2021-05-19 MED ORDER — POLYETHYLENE GLYCOL 3350 17 G PO PACK
17.0000 g | PACK | Freq: Every day | ORAL | Status: DC
  Administered 2021-05-19 – 2021-06-03 (×11): 17 g via ORAL
  Filled 2021-05-19 (×14): qty 1

## 2021-05-19 NOTE — Progress Notes (Addendum)
PROGRESS NOTE  Justin Richmond BJS:[id number] DOB: January 09, 1924 DOA: 05/17/2021 PCP: Johny Blamer, MD  HPI/Recap of past 17 hours: 87 year old male admitted to the hospital for evaluation of worsening shortness of breath cough fever and chills for 3 days.  She has a past medical history significant for interstitial lung disease CHF hypertension hyperlipidemia overactive bladder recurrent UTI  May 18, 2021: Patient seen and examined at bedside his son-in-law is at bedside he denies any new complaints.  Currently on oxygen at 5 L it was at 6 L when he first presented to the emergency room  July 08/29/2021 Patient seen and examined at bedside stated that he has been having difficulty making bowel movement requesting for MiraLAX.  He was put on the potty bedside commode he said he is just made very little and it has been really hard to push and she is exhausted. Also complaining of bleeding hemorrhoid  Assessment/Plan: Principal Problem:   CAP (community acquired pneumonia) Active Problems:   ILD (interstitial lung disease) (HCC)   Acute hypoxemic respiratory failure (HCC)   Sepsis (HCC)   CHF exacerbation (HCC)   Pressure injury of skin  1.  Community-acquired pneumonia.  Patient will continue IV ceftriaxone and azithromycin.  And Solu-Medrol he was febrile and tachypneic on presentation to the ED.  His PCR influenza and COVID-19 were both negative  2.  Acute hypoxic respiratory failure requiring currently following 5 L/min of oxygen.  His initial oxygenation was 60% on room air with EMS.  Patient was not on home oxygen  3.  Sepsis due to community-acquired pneumonia.  Lactic acid was elevated but now resolved  patient will continue IV rehydration and antibiotics  4.  Hypertension.  He was initially normotensive his blood pressure is getting little higher Continue home Coreg We will monitor it  5.  Acute on chronic systolic CHF.  He had elevated BNP in the emergency room he was  given 1 dose of IV Lasix.  Patient was not on home Lasix from review of his medications.  We will reassess in the morning to see if he is needing to be restarted on routine IV or p.o. diuretic  6.  Hyperlipidemia continue rosuvastatin  7.  Constipation we will add MiraLAX daily and Colace twice daily  8.  Hemorrhoid that is bleeding likely from constipation and pushing during defecation Per RN patient has  large hemorrhoids  we will start him on Preparation H  hemorrhoidal cream and Tucks pads Also start him on MiraLAX for constipation, prevent constipation  9.  Urinary tract infection.  Patient is already on ceftriaxone for community-acquired pneumonia. His urine culture is growing less than 10,000 colony count of bacteria in  Code Status: DNR  Severity of Illness: The appropriate patient status for this patient is INPATIENT. Inpatient status is judged to be reasonable and necessary in order to provide the required intensity of service to ensure the patient's safety. The patient's presenting symptoms, physical exam findings, and initial radiographic and laboratory data in the context of their chronic comorbidities is felt to place them at high risk for further clinical deterioration. Furthermore, it is not anticipated that the patient will be medically stable for discharge from the hospital within 2 midnights of admission. The following factors support the patient status of inpatient.  Hypoxia requiring initially 6 L of oxygen per minute   * I certify that at the point of admission it is my clinical judgment that the patient will require inpatient hospital care spanning  beyond 2 midnights from the point of admission due to high intensity of service, high risk for further deterioration and high frequency of surveillance required.*   Family Communication: Son in law Loistine Chance at bedside  Disposition Plan: To be  determined   Consultants: None  Procedures: None  Antimicrobials: Azithromycin Ceftriaxone   DVT prophylaxis: Lovenox GI prophylaxis.  Restart Protonix 20 mg daily  Objective: Vitals:   05/19/21 0434 05/19/21 0500 05/19/21 0742 05/19/21 1304  BP: (!) 160/92     Pulse: 85     Resp: 20     Temp: 97.9 F (36.6 C)     TempSrc: Oral     SpO2: 93%  91% (!) 87%  Weight:  61.5 kg      Intake/Output Summary (Last 24 hours) at 05/19/2021 1340 Last data filed at 05/19/2021 0955 Gross per 24 hour  Intake 883.22 ml  Output 350 ml  Net 533.22 ml    Filed Weights   05/19/21 0500  Weight: 61.5 kg   Body mass index is 22.56 kg/m.  Exam:  General: 87 y.o. year-old male well developed frail looking n no acute distress.  Alert and oriented x3. Cardiovascular: Regular rate and rhythm with no rubs or gallops.  No thyromegaly or JVD noted.   Respiratory: Bilateral rales. Good inspiratory effort. Abdomen: Soft nontender nondistended with normal bowel sounds x4 quadrants. Musculoskeletal: No lower extremity edema. 2/4 pulses in all 4 extremities. Skin: He has a small stage 2 on his bottom Psychiatry: Mood is appropriate for condition and setting    Data Reviewed: CBC: Recent Labs  Lab 05/17/21 2244 05/19/21 0452  WBC 5.9 10.5  NEUTROABS  --  9.0*  HGB 11.2* 11.6*  HCT 34.5* 35.8*  MCV 93.8 92.0  PLT 195 226    Basic Metabolic Panel: Recent Labs  Lab 05/17/21 2244 05/19/21 0452  NA 132* 137  K 4.1 3.5  CL 97* 99  CO2 25 29  GLUCOSE 165* 157*  BUN 28* 31*  CREATININE 0.92 0.82  CALCIUM 8.8* 8.9    GFR: Estimated Creatinine Clearance: 47.9 mL/min (by C-G formula based on SCr of 0.82 mg/dL). Liver Function Tests: Recent Labs  Lab 05/17/21 2244  AST 39  ALT 27  ALKPHOS 55  BILITOT 1.0  PROT 7.6  ALBUMIN 3.1*    No results for input(s): LIPASE, AMYLASE in the last 168 hours. No results for input(s): AMMONIA in the last 168 hours. Coagulation  Profile: Recent Labs  Lab 05/17/21 2244  INR 1.1    Cardiac Enzymes: No results for input(s): CKTOTAL, CKMB, CKMBINDEX, TROPONINI in the last 168 hours. BNP (last 3 results) No results for input(s): PROBNP in the last 8760 hours. HbA1C: No results for input(s): HGBA1C in the last 72 hours. CBG: No results for input(s): GLUCAP in the last 168 hours. Lipid Profile: No results for input(s): CHOL, HDL, LDLCALC, TRIG, CHOLHDL, LDLDIRECT in the last 72 hours. Thyroid Function Tests: No results for input(s): TSH, T4TOTAL, FREET4, T3FREE, THYROIDAB in the last 72 hours. Anemia Panel: No results for input(s): VITAMINB12, FOLATE, FERRITIN, TIBC, IRON, RETICCTPCT in the last 72 hours. Urine analysis:    Component Value Date/Time   COLORURINE YELLOW 05/18/2021 0541   APPEARANCEUR CLEAR 05/18/2021 0541   LABSPEC 1.012 05/18/2021 0541   PHURINE 5.0 05/18/2021 0541   GLUCOSEU NEGATIVE 05/18/2021 0541   HGBUR NEGATIVE 05/18/2021 0541   BILIRUBINUR NEGATIVE 05/18/2021 0541   KETONESUR NEGATIVE 05/18/2021 0541   PROTEINUR 100 (A) 05/18/2021  0541   NITRITE NEGATIVE 05/18/2021 0541   LEUKOCYTESUR NEGATIVE 05/18/2021 0541   Sepsis Labs: @LABRCNTIP (procalcitonin:4,lacticidven:4)  ) Recent Results (from the past 240 hour(s))  Culture, blood (Routine x 2)     Status: None (Preliminary result)   Collection Time: 05/17/21 10:44 PM   Specimen: BLOOD  Result Value Ref Range Status   Specimen Description   Final    BLOOD LEFT ANTECUBITAL Performed at [facility], [address] [place], [place] [address]    Special Requests   Final    BOTTLES DRAWN AEROBIC AND ANAEROBIC Blood Culture adequate volume Performed at [facility], [address] [place], [place] [address]    Culture   Final    NO GROWTH 1 DAY Performed at [facility], [address] [place], [place] [address]    Report Status PENDING  Incomplete  Resp Panel by RT-PCR (Flu A&B,  Covid) Nasopharyngeal Swab     Status: None   Collection Time: 05/17/21 10:51 PM   Specimen: Nasopharyngeal Swab; Nasopharyngeal(NP) swabs in vial transport medium  Result Value Ref Range Status   SARS Coronavirus 2 by RT PCR NEGATIVE NEGATIVE Final    Comment: (NOTE) SARS-CoV-2 target nucleic acids are NOT DETECTED.  The SARS-CoV-2 RNA is generally detectable in upper respiratory specimens during the acute phase of infection. The lowest concentration of SARS-CoV-2 viral copies this assay can detect is 138 copies/mL. A negative result does not preclude SARS-Cov-2 infection and should not be used as the sole basis for treatment or other patient management decisions. A negative result may occur with  improper specimen collection/handling, submission of specimen other than nasopharyngeal swab, presence of viral mutation(s) within the areas targeted by this assay, and inadequate number of viral copies(<138 copies/mL). A negative result must be combined with clinical observations, patient history, and epidemiological information. The expected result is Negative.  Fact Sheet for Patients:  BloggerCourse.comhttps://www.fda.gov/media/152166/download  Fact Sheet for Healthcare Providers:  SeriousBroker.ithttps://www.fda.gov/media/152162/download  This test is no t yet approved or cleared by the [place] FDA and  has been authorized for detection and/or diagnosis of SARS-CoV-2 by FDA under an Emergency Use Authorization (EUA). This EUA will remain  in effect (meaning this test can be used) for the duration of the COVID-19 declaration under Section 564(b)(1) of the Act, 21 U.S.C.section 360bbb-3(b)(1), unless the authorization is terminated  or revoked sooner.       Influenza A by PCR NEGATIVE NEGATIVE Final   Influenza B by PCR NEGATIVE NEGATIVE Final    Comment: (NOTE) The Xpert Xpress SARS-CoV-2/FLU/RSV plus assay is intended as an aid in the diagnosis of influenza from Nasopharyngeal swab specimens and should  not be used as a sole basis for treatment. Nasal washings and aspirates are unacceptable for Xpert Xpress SARS-CoV-2/FLU/RSV testing.  Fact Sheet for Patients: BloggerCourse.comhttps://www.fda.gov/media/152166/download  Fact Sheet for Healthcare Providers: SeriousBroker.ithttps://www.fda.gov/media/152162/download  This test is not yet approved or cleared by the [place] FDA and has been authorized for detection and/or diagnosis of SARS-CoV-2 by FDA under an Emergency Use Authorization (EUA). This EUA will remain in effect (meaning this test can be used) for the duration of the COVID-19 declaration under Section 564(b)(1) of the Act, 21 U.S.C. section 360bbb-3(b)(1), unless the authorization is terminated or revoked.  Performed at [facility], [address] [place], [place] [address]   Blood culture (routine x 2)     Status: None (Preliminary result)   Collection Time: 05/18/21  1:00 AM  Specimen: BLOOD  Result Value Ref Range Status   Specimen Description   Final    BLOOD SITE NOT SPECIFIED Performed at [facility], [address] [place], [place] [address]    Special Requests   Final    BOTTLES DRAWN AEROBIC AND ANAEROBIC Blood Culture adequate volume Performed at [facility], [address], [place], [place] [address]    Culture   Final    NO GROWTH 1 DAY Performed at [facility], [address] [place], [place] [address]    Report Status PENDING  Incomplete  Urine culture     Status: Abnormal   Collection Time: 05/18/21  5:41 AM   Specimen: Urine, Random  Result Value Ref Range Status   Specimen Description   Final    URINE, RANDOM Performed at [facility], [address] [place], [place] [address]    Special Requests   Final    NONE Performed at [facility], [address], [place], [place] [address]    Culture (A)  Final    <10,000 COLONIES/mL INSIGNIFICANT GROWTH Performed at  [facility], [address] [place], [place] [address]    Report Status 05/19/2021 FINAL  Final      Studies: DG CHEST PORT 1 VIEW  Result Date: 05/19/2021 CLINICAL DATA:  Shortness of breath, cough, fever and chills. EXAM: PORTABLE CHEST 1 VIEW COMPARISON:  Chest x-rays dated 05/17/2021 and 03/10/2021. FINDINGS: Ill-defined airspace opacities bilaterally, likely superimposed on chronic interstitial lung disease. No pleural effusion or pneumothorax is seen. Heart size and mediastinal contours are grossly stable. LEFT chest wall pacemaker/ICD hardware in place. IMPRESSION: Multifocal pneumonia superimposed on chronic interstitial lung disease, not significantly changed compared to chest x-ray of 05/17/2021. Electronically Signed   By: Bary Richard M.D.   On: 05/19/2021 08:34    Scheduled Meds:  enoxaparin (LOVENOX) injection  40 mg Subcutaneous Q24H   feeding supplement  237 mL Oral BID BM   ipratropium-albuterol  3 mL Nebulization Q6H   methylPREDNISolone (SOLU-MEDROL) injection  60 mg Intravenous Q12H    Continuous Infusions:  azithromycin 250 mL/hr at 05/19/21 0400   cefTRIAXone (ROCEPHIN)  IV Stopped (05/19/21 0105)     LOS: 1 day     Myrtie Neither, MD [facility]  To reach me or the doctor on call, go to: [email or web address] Password [email or web address]  05/19/2021, 1:40 PM

## 2021-05-20 ENCOUNTER — Encounter (HOSPITAL_COMMUNITY): Payer: Self-pay | Admitting: Internal Medicine

## 2021-05-20 DIAGNOSIS — R6520 Severe sepsis without septic shock: Secondary | ICD-10-CM

## 2021-05-20 DIAGNOSIS — J189 Pneumonia, unspecified organism: Secondary | ICD-10-CM | POA: Diagnosis not present

## 2021-05-20 DIAGNOSIS — J9601 Acute respiratory failure with hypoxia: Secondary | ICD-10-CM | POA: Diagnosis not present

## 2021-05-20 DIAGNOSIS — A419 Sepsis, unspecified organism: Secondary | ICD-10-CM | POA: Diagnosis not present

## 2021-05-20 LAB — CBC WITH DIFFERENTIAL/PLATELET
Band Neutrophils: 10 %
Basophils Relative: 0 %
Blasts: NONE SEEN %
Eosinophils Relative: 0 %
HCT: 36.9 % — ABNORMAL LOW (ref 39.0–52.0)
Hemoglobin: 12 g/dL — ABNORMAL LOW (ref 13.0–17.0)
Lymphocytes Relative: 8 %
MCH: 30.2 pg (ref 26.0–34.0)
MCHC: 32.5 g/dL (ref 30.0–36.0)
MCV: 92.7 fL (ref 80.0–100.0)
Metamyelocytes Relative: NONE SEEN %
Monocytes Relative: 5 %
Myelocytes: NONE SEEN %
Neutrophils Relative %: 77 %
Platelets: 234 10*3/uL (ref 150–400)
Promyelocytes Relative: NONE SEEN %
RBC Morphology: NORMAL
RBC: 3.98 MIL/uL — ABNORMAL LOW (ref 4.22–5.81)
RDW: 13.8 % (ref 11.5–15.5)
WBC Morphology: NORMAL
WBC: 13.1 10*3/uL — ABNORMAL HIGH (ref 4.0–10.5)
nRBC: 0.2 % (ref 0.0–0.2)
nRBC: NONE SEEN /100 WBC

## 2021-05-20 LAB — BASIC METABOLIC PANEL
Anion gap: 9 (ref 5–15)
BUN: 47 mg/dL — ABNORMAL HIGH (ref 8–23)
CO2: 29 mmol/L (ref 22–32)
Calcium: 9.1 mg/dL (ref 8.9–10.3)
Chloride: 100 mmol/L (ref 98–111)
Creatinine, Ser: 0.76 mg/dL (ref 0.61–1.24)
GFR, Estimated: >60 mL/min (ref >60)
Glucose, Bld: 140 mg/dL — ABNORMAL HIGH (ref 70–99)
Potassium: 3.8 mmol/L (ref 3.5–5.1)
Sodium: 138 mmol/L (ref 135–145)

## 2021-05-20 MED ORDER — VITAMIN B-12 5000 MCG SL SUBL: 5000.0000 ug | SUBLINGUAL_TABLET | Freq: Every day | SUBLINGUAL | Status: DC

## 2021-05-20 MED ORDER — GABAPENTIN 100 MG PO CAPS
100.0000 mg | ORAL_CAPSULE | Freq: Every day | ORAL | Status: DC
  Administered 2021-05-21 – 2021-06-03 (×14): 100 mg via ORAL
  Filled 2021-05-20 (×15): qty 1

## 2021-05-20 MED ORDER — OXYBUTYNIN CHLORIDE 5 MG PO TABS
5.0000 mg | ORAL_TABLET | Freq: Two times a day (BID) | ORAL | Status: DC
  Administered 2021-05-21 – 2021-06-01 (×24): 5 mg via ORAL
  Filled 2021-05-20 (×25): qty 1

## 2021-05-20 MED ORDER — BUDESON-GLYCOPYRROL-FORMOTEROL 160-9-4.8 MCG/ACT IN AERO: 2.0000 | INHALATION_SPRAY | Freq: Two times a day (BID) | RESPIRATORY_TRACT | Status: DC

## 2021-05-20 MED ORDER — ASPIRIN EC 81 MG PO TBEC
81.0000 mg | DELAYED_RELEASE_TABLET | Freq: Every day | ORAL | Status: DC
  Administered 2021-05-21 – 2021-06-03 (×14): 81 mg via ORAL
  Filled 2021-05-20 (×14): qty 1

## 2021-05-20 MED ORDER — VITAMIN B-12 1000 MCG PO TABS
5000.0000 ug | ORAL_TABLET | Freq: Every day | ORAL | Status: DC
  Administered 2021-05-21 – 2021-06-03 (×13): 5000 ug via ORAL
  Filled 2021-05-20 (×13): qty 5

## 2021-05-20 NOTE — Plan of Care (Signed)
  Problem: Education: Goal: Knowledge of General Education information will improve Description: Including pain rating scale, medication(s)/side effects and non-pharmacologic comfort measures Outcome: Progressing   Problem: Activity: Goal: Ability to tolerate increased activity will improve Outcome: Progressing   Problem: Clinical Measurements: Goal: Ability to maintain a body temperature in the normal range will improve Outcome: Progressing   Problem: Respiratory: Goal: Ability to maintain adequate ventilation will improve Outcome: Progressing Goal: Ability to maintain a clear airway will improve Outcome: Progressing   

## 2021-05-20 NOTE — Progress Notes (Signed)
Initial Nutrition Assessment  DOCUMENTATION CODES:   Not applicable  INTERVENTION:  - continue Ensure Enlive BID, each supplement provides 350 kcal and 20 grams of protein. - complete NFPE when feasible.    NUTRITION DIAGNOSIS:   Increased nutrient needs related to acute illness as evidenced by estimated needs.  GOAL:   Patient will meet greater than or equal to 90% of their needs  MONITOR:   PO intake, Supplement acceptance, Labs, Weight trends  REASON FOR ASSESSMENT:   Malnutrition Screening Tool  ASSESSMENT:   87 year old male with medical history of hypercholesterolemia, HTN, hemorrhoids, chronic constipation, spinal stenosis, migraines, hearing loss, scoliosis, osteoarthritis, CHF, chronic back pain, and interstitial lung disease. He presented to the ED d/t worsening SOB, cough, fever, and chills x3 days.  Most recently documented meal intakes: 7/9- 75% of dinner (361 kcal and 26 grams protein) 7/10- 100% of breakfast and 50% of lunch (total of 554 kcal and 50 grams protein) 7/11- 100% of breakfast (334 kcal and 21 grams protein)  Ensure Enlive was ordered BID per ONS protocol starting yesterday and he has accepted 2 of the 3 bottles of supplement offered.   Weight today is 133 lb, weight yesterday was 135.5 lb, and weight on 4/7 was 137 lb. This indicates 4 lb weight loss (3% body weight); not significant for time frame.   Per notes: - severe sepsis 2/2 CAP - acute on chronic CHF - constipation--improving   Unable to see patient at time of attempted visit.   Labs reviewed; BUN: 47 mg/dl. Medications reviewed; 100 mg colace BID, 60 mg solu-medrol BID, 20 mg oral protonix/day, 17 g miralax/day, 500 mg magonate/day.     NUTRITION - FOCUSED PHYSICAL EXAM:  Unable to complete at this time.    Diet Order:   Diet Order             Diet Heart Room service appropriate? Yes; Fluid consistency: Thin; Fluid restriction: 1200 mL Fluid  Diet effective now                    EDUCATION NEEDS:   No education needs have been identified at this time  Skin:  Skin Assessment: Skin Integrity Issues: Skin Integrity Issues:: Stage I, Stage II Stage I: coccyx Stage II: L buttocks  Last BM:  7/11 (type 4 x1)  Height:   Ht Readings from Last 1 Encounters:  03/21/21 5\' 5"  (1.651 m)    Weight:   Wt Readings from Last 1 Encounters:  05/20/21 60.2 kg      Estimated Nutritional Needs:  Kcal:  1600-1850 kcal Protein:  70-85 grams Fluid:  >/= 1.8 L/day       07/21/21, MS, RD, LDN, CNSC Inpatient Clinical Dietitian RD pager # available in AMION  After hours/weekend pager # available in [facility]

## 2021-05-20 NOTE — Progress Notes (Addendum)
Progress Note    Denese KillingsJoseph Reeves  ZOX:[id number] DOB: 04/18/1924  DOA: 05/17/2021 PCP: Johny BlamerHarris, William, MD      Brief Narrative:    Medical records reviewed and are as summarized below:  Denese KillingsJoseph Kiddy is a 87 y.o. male with medical history significant for interstitial lung disease, complete heart block requiring permanent pacemaker, chronic diastolic CHF, hypertension, hyperlipidemia, overactive bladder, recurrent UTI, who presented to the hospital with cough, shortness of breath, fever, chills for about 3 days duration.  Reportedly, his oxygen saturation 6% on room air when EMS picked him up.      Assessment/Plan:   Principal Problem:   Severe sepsis (HCC) Active Problems:   ILD (interstitial lung disease) (HCC)   CAP (community acquired pneumonia)   Acute hypoxemic respiratory failure (HCC)   CHF exacerbation (HCC)   Pressure injury of skin   Pressure injury of buttock, stage 2 (HCC)   Hemorrhoid   Body mass index is 22.09 kg/m.    Severe sepsis secondary to community-acquired pneumonia: Continue empiric IV antibiotics.  Follow-up blood cultures.  Acute hypoxic respiratory failure: Continue 4 L/min oxygen via nasal cannula.  Taper off oxygen as able.  Acute on chronic diastolic CHF: He had 1 dose of IV Lasix in the emergency room.  2D echo showed EF estimated at 50 to 55%, grade 1 diastolic dysfunction  Constipation: Improved  Urinalysis and urine culture are not suggestive of UTI.   Other comorbidities include interstitial lung disease, hypertension, hyperlipidemia, hemorrhoids, s/p permanent pacemaker for complete heart block   Diet Order             Diet Heart Room service appropriate? Yes; Fluid consistency: Thin; Fluid restriction: 1200 mL Fluid  Diet effective now                      Consultants: None  Procedures: None    Medications:    carvedilol  12.5 mg Oral BID WC   docusate sodium  100 mg Oral BID   enoxaparin  (LOVENOX) injection  40 mg Subcutaneous Q24H   feeding supplement  237 mL Oral BID BM   hydrocortisone  25 mg Rectal BID   ipratropium-albuterol  3 mL Nebulization Q6H   magnesium gluconate  500 mg Oral QHS   methylPREDNISolone (SOLU-MEDROL) injection  60 mg Intravenous Q12H   pantoprazole  20 mg Oral Daily   polyethylene glycol  17 g Oral Daily   rOPINIRole  0.25 mg Oral QHS   rosuvastatin  10 mg Oral QHS   Continuous Infusions:  azithromycin 500 mg (05/20/21 0213)   cefTRIAXone (ROCEPHIN)  IV 1 g (05/20/21 0119)     Anti-infectives (From admission, onward)    Start     Dose/Rate Route Frequency Ordered Stop   05/19/21 0100  cefTRIAXone (ROCEPHIN) 1 g in sodium chloride 0.9 % 100 mL IVPB        1 g 200 mL/hr over 30 Minutes Intravenous Every 24 hours 05/18/21 0341     05/18/21 0400  azithromycin (ZITHROMAX) 500 mg in sodium chloride 0.9 % 250 mL IVPB        500 mg 250 mL/hr over 60 Minutes Intravenous Every 24 hours 05/18/21 0341     05/17/21 2345  cefTRIAXone (ROCEPHIN) 1 g in sodium chloride 0.9 % 100 mL IVPB        1 g 200 mL/hr over 30 Minutes Intravenous  Once 05/17/21 2333 05/18/21 0116  Family Communication/Anticipated D/C date and plan/Code Status   DVT prophylaxis: enoxaparin (LOVENOX) injection 40 mg Start: 05/18/21 1000     Code Status: DNR  Family Communication: None Disposition Plan:    Status is: Inpatient  Remains inpatient appropriate because:Inpatient level of care appropriate due to severity of illness  Dispo: The patient is from: Home              Anticipated d/c is to: Home              Patient currently is not medically stable to d/c.   Difficult to place patient No           Subjective:   Interval events noted. c/o cough and shortness of breath.  No chest pain.  He does not use oxygen at home.  His 2 nurses were at the bedside.  Objective:    Vitals:   05/20/21 0432 05/20/21 0500 05/20/21 0746 05/20/21  1236  BP: (!) 148/85   (!) 154/81  Pulse: 72   71  Resp: 20   20  Temp: 98.1 F (36.7 C)   98 F (36.7 C)  TempSrc:    Oral  SpO2: 92%  92% 98%  Weight:  60.2 kg     No data found.   Intake/Output Summary (Last 24 hours) at 05/20/2021 1440 Last data filed at 05/20/2021 1000 Gross per 24 hour  Intake 877.48 ml  Output 450 ml  Net 427.48 ml   Filed Weights   05/19/21 0500 05/20/21 0500  Weight: 61.5 kg 60.2 kg    Exam:  GEN: NAD SKIN: Warm and dry.  Stage I coccygeal and stage II left buttock decubitus ulcers (all present on admission). EYES: EOMI ENT: MMM CV: RRR PULM: Diffuse bibasilar rales.  No wheezing heard. ABD: soft, ND, NT, +BS CNS: AAO x 3, non focal EXT: No edema or tenderness     Data Reviewed:   I have personally reviewed following labs and imaging studies:  Labs: Labs show the following:   Basic Metabolic Panel: Recent Labs  Lab 05/17/21 2244 05/19/21 0452 05/20/21 0407  NA 132* 137 138  K 4.1 3.5 3.8  CL 97* 99 100  CO2 25 29 29   GLUCOSE 165* 157* 140*  BUN 28* 31* 47*  CREATININE 0.92 0.82 0.76  CALCIUM 8.8* 8.9 9.1   GFR Estimated Creatinine Clearance: 48.1 mL/min (by C-G formula based on SCr of 0.76 mg/dL). Liver Function Tests: Recent Labs  Lab 05/17/21 2244  AST 39  ALT 27  ALKPHOS 55  BILITOT 1.0  PROT 7.6  ALBUMIN 3.1*   No results for input(s): LIPASE, AMYLASE in the last 168 hours. No results for input(s): AMMONIA in the last 168 hours. Coagulation profile Recent Labs  Lab 05/17/21 2244  INR 1.1    CBC: Recent Labs  Lab 05/17/21 2244 05/19/21 0452 05/20/21 0407  WBC 5.9 10.5 13.1*  NEUTROABS  --  9.0*  --   HGB 11.2* 11.6* 12.0*  HCT 34.5* 35.8* 36.9*  MCV 93.8 92.0 92.7  PLT 195 226 234   Cardiac Enzymes: No results for input(s): CKTOTAL, CKMB, CKMBINDEX, TROPONINI in the last 168 hours. BNP (last 3 results) No results for input(s): PROBNP in the last 8760 hours. CBG: No results for input(s):  GLUCAP in the last 168 hours. D-Dimer: No results for input(s): DDIMER in the last 72 hours. Hgb A1c: No results for input(s): HGBA1C in the last 72 hours. Lipid Profile: No results for input(s):  CHOL, HDL, LDLCALC, TRIG, CHOLHDL, LDLDIRECT in the last 72 hours. Thyroid function studies: No results for input(s): TSH, T4TOTAL, T3FREE, THYROIDAB in the last 72 hours.  Invalid input(s): FREET3 Anemia work up: No results for input(s): VITAMINB12, FOLATE, FERRITIN, TIBC, IRON, RETICCTPCT in the last 72 hours. Sepsis Labs: Recent Labs  Lab 05/17/21 2241 05/17/21 2244 05/19/21 0452 05/20/21 0407  PROCALCITON 1.25  --   --   --   WBC  --  5.9 10.5 13.1*  LATICACIDVEN  --  1.3  --   --     Microbiology Recent Results (from the past 240 hour(s))  Culture, blood (Routine x 2)     Status: None (Preliminary result)   Collection Time: 05/17/21 10:44 PM   Specimen: BLOOD  Result Value Ref Range Status   Specimen Description   Final    BLOOD LEFT ANTECUBITAL Performed at [facility], [address], [place], [place] [address]    Special Requests   Final    BOTTLES DRAWN AEROBIC AND ANAEROBIC Blood Culture adequate volume Performed at [facility], [address], [place], [place] [address]    Culture   Final    NO GROWTH 2 DAYS Performed at [facility], [address], [place], [place] [address]    Report Status PENDING  Incomplete  Resp Panel by RT-PCR (Flu A&B, Covid) Nasopharyngeal Swab     Status: None   Collection Time: 05/17/21 10:51 PM   Specimen: Nasopharyngeal Swab; Nasopharyngeal(NP) swabs in vial transport medium  Result Value Ref Range Status   SARS Coronavirus 2 by RT PCR NEGATIVE NEGATIVE Final    Comment: (NOTE) SARS-CoV-2 target nucleic acids are NOT DETECTED.  The SARS-CoV-2 RNA is generally detectable in upper respiratory specimens during the acute phase of infection. The lowest concentration of SARS-CoV-2 viral  copies this assay can detect is 138 copies/mL. A negative result does not preclude SARS-Cov-2 infection and should not be used as the sole basis for treatment or other patient management decisions. A negative result may occur with  improper specimen collection/handling, submission of specimen other than nasopharyngeal swab, presence of viral mutation(s) within the areas targeted by this assay, and inadequate number of viral copies(<138 copies/mL). A negative result must be combined with clinical observations, patient history, and epidemiological information. The expected result is Negative.  Fact Sheet for Patients:  BloggerCourse.com  Fact Sheet for Healthcare Providers:  SeriousBroker.it  This test is no t yet approved or cleared by the [place] FDA and  has been authorized for detection and/or diagnosis of SARS-CoV-2 by FDA under an Emergency Use Authorization (EUA). This EUA will remain  in effect (meaning this test can be used) for the duration of the COVID-19 declaration under Section 564(b)(1) of the Act, 21 U.S.C.section 360bbb-3(b)(1), unless the authorization is terminated  or revoked sooner.       Influenza A by PCR NEGATIVE NEGATIVE Final   Influenza B by PCR NEGATIVE NEGATIVE Final    Comment: (NOTE) The Xpert Xpress SARS-CoV-2/FLU/RSV plus assay is intended as an aid in the diagnosis of influenza from Nasopharyngeal swab specimens and should not be used as a sole basis for treatment. Nasal washings and aspirates are unacceptable for Xpert Xpress SARS-CoV-2/FLU/RSV testing.  Fact Sheet for Patients: BloggerCourse.com  Fact Sheet for Healthcare Providers: SeriousBroker.it  This test is not yet approved or cleared by the [place] FDA and has been authorized for detection and/or diagnosis of SARS-CoV-2 by FDA under  an Emergency Use Authorization (EUA). This  EUA will remain in effect (meaning this test can be used) for the duration of the COVID-19 declaration under Section 564(b)(1) of the Act, 21 U.S.C. section 360bbb-3(b)(1), unless the authorization is terminated or revoked.  Performed at [facility], [address], [place], [place] [address]   Blood culture (routine x 2)     Status: None (Preliminary result)   Collection Time: 05/18/21  1:00 AM   Specimen: BLOOD  Result Value Ref Range Status   Specimen Description   Final    BLOOD SITE NOT SPECIFIED Performed at [facility], [address] [place], [place] [address]    Special Requests   Final    BOTTLES DRAWN AEROBIC AND ANAEROBIC Blood Culture adequate volume Performed at [facility], [address] [place], [place] [address]    Culture   Final    NO GROWTH 2 DAYS Performed at [facility], [address], [place], [place] [address]    Report Status PENDING  Incomplete  Urine culture     Status: Abnormal   Collection Time: 05/18/21  5:41 AM   Specimen: Urine, Random  Result Value Ref Range Status   Specimen Description   Final    URINE, RANDOM Performed at [facility], [address] [place], [place] [address]    Special Requests   Final    NONE Performed at [facility], [address], [place], [place] [address]    Culture (A)  Final    <10,000 COLONIES/mL INSIGNIFICANT GROWTH Performed at [facility], [address], [place], [place] [address]    Report Status 05/19/2021 FINAL  Final    Procedures and diagnostic studies:  DG CHEST PORT 1 VIEW  Result Date: 05/19/2021 CLINICAL DATA:  Shortness of breath, cough, fever and chills. EXAM: PORTABLE CHEST 1 VIEW COMPARISON:  Chest x-rays dated 05/17/2021 and 03/10/2021. FINDINGS: Ill-defined airspace opacities bilaterally, likely superimposed on chronic interstitial lung disease. No pleural effusion or  pneumothorax is seen. Heart size and mediastinal contours are grossly stable. LEFT chest wall pacemaker/ICD hardware in place. IMPRESSION: Multifocal pneumonia superimposed on chronic interstitial lung disease, not significantly changed compared to chest x-ray of 05/17/2021. Electronically Signed   By: Bary Richard M.D.   On: 05/19/2021 08:34               LOS: 2 days   Reakwon Barren  [facility]   Pager on [email or web address]. If 7PM-7AM, please contact night-coverage at [email or web address]     05/20/2021, 2:40 PM

## 2021-05-21 DIAGNOSIS — J189 Pneumonia, unspecified organism: Secondary | ICD-10-CM | POA: Diagnosis not present

## 2021-05-21 DIAGNOSIS — A419 Sepsis, unspecified organism: Secondary | ICD-10-CM | POA: Diagnosis not present

## 2021-05-21 DIAGNOSIS — J849 Interstitial pulmonary disease, unspecified: Secondary | ICD-10-CM | POA: Diagnosis not present

## 2021-05-21 DIAGNOSIS — J9601 Acute respiratory failure with hypoxia: Secondary | ICD-10-CM | POA: Diagnosis not present

## 2021-05-21 LAB — BASIC METABOLIC PANEL
Anion gap: 7 (ref 5–15)
BUN: 48 mg/dL — ABNORMAL HIGH (ref 8–23)
CO2: 28 mmol/L (ref 22–32)
Calcium: 8.9 mg/dL (ref 8.9–10.3)
Chloride: 104 mmol/L (ref 98–111)
Creatinine, Ser: 0.84 mg/dL (ref 0.61–1.24)
GFR, Estimated: >60 mL/min (ref >60)
Glucose, Bld: 139 mg/dL — ABNORMAL HIGH (ref 70–99)
Potassium: 4.1 mmol/L (ref 3.5–5.1)
Sodium: 139 mmol/L (ref 135–145)

## 2021-05-21 LAB — CBC WITH DIFFERENTIAL/PLATELET
Abs Immature Granulocytes: 0.77 10*3/uL — ABNORMAL HIGH (ref 0.00–0.07)
Basophils Absolute: 0.1 10*3/uL (ref 0.0–0.1)
Basophils Relative: 1 %
Eosinophils Absolute: 0 10*3/uL (ref 0.0–0.5)
Eosinophils Relative: 0 %
HCT: 34.1 % — ABNORMAL LOW (ref 39.0–52.0)
Hemoglobin: 10.9 g/dL — ABNORMAL LOW (ref 13.0–17.0)
Immature Granulocytes: 6 %
Lymphocytes Relative: 10 %
Lymphs Abs: 1.3 10*3/uL (ref 0.7–4.0)
MCH: 30.1 pg (ref 26.0–34.0)
MCHC: 32 g/dL (ref 30.0–36.0)
MCV: 94.2 fL (ref 80.0–100.0)
Monocytes Absolute: 1.1 10*3/uL — ABNORMAL HIGH (ref 0.1–1.0)
Monocytes Relative: 8 %
Neutro Abs: 10.5 10*3/uL — ABNORMAL HIGH (ref 1.7–7.7)
Neutrophils Relative %: 75 %
Platelets: 223 10*3/uL (ref 150–400)
RBC: 3.62 MIL/uL — ABNORMAL LOW (ref 4.22–5.81)
RDW: 14 % (ref 11.5–15.5)
WBC: 13.8 10*3/uL — ABNORMAL HIGH (ref 4.0–10.5)
nRBC: 0.2 % (ref 0.0–0.2)

## 2021-05-21 LAB — BRAIN NATRIURETIC PEPTIDE: B Natriuretic Peptide: 407.9 pg/mL — ABNORMAL HIGH (ref 0.0–100.0)

## 2021-05-21 MED ORDER — FUROSEMIDE 10 MG/ML IJ SOLN
20.0000 mg | Freq: Once | INTRAMUSCULAR | Status: AC
  Administered 2021-05-21: 20 mg via INTRAVENOUS
  Filled 2021-05-21: qty 2

## 2021-05-21 MED ORDER — UMECLIDINIUM BROMIDE 62.5 MCG/INH IN AEPB
1.0000 | INHALATION_SPRAY | Freq: Every day | RESPIRATORY_TRACT | Status: DC
  Administered 2021-05-22 – 2021-06-02 (×12): 1 via RESPIRATORY_TRACT
  Filled 2021-05-21 (×3): qty 7

## 2021-05-21 MED ORDER — IPRATROPIUM-ALBUTEROL 0.5-2.5 (3) MG/3ML IN SOLN
3.0000 mL | Freq: Two times a day (BID) | RESPIRATORY_TRACT | Status: DC
  Administered 2021-05-21: 3 mL via RESPIRATORY_TRACT
  Filled 2021-05-21: qty 3

## 2021-05-21 MED ORDER — BUDESONIDE 0.5 MG/2ML IN SUSP
1.0000 mg | Freq: Two times a day (BID) | RESPIRATORY_TRACT | Status: DC
  Administered 2021-05-21 – 2021-05-23 (×5): 1 mg via RESPIRATORY_TRACT
  Administered 2021-05-24: 0.5 mg via RESPIRATORY_TRACT
  Administered 2021-05-24 – 2021-06-03 (×21): 1 mg via RESPIRATORY_TRACT
  Filled 2021-05-21 (×27): qty 4

## 2021-05-21 MED ORDER — ARFORMOTEROL TARTRATE 15 MCG/2ML IN NEBU
15.0000 ug | INHALATION_SOLUTION | Freq: Two times a day (BID) | RESPIRATORY_TRACT | Status: DC
  Administered 2021-05-21 – 2021-06-03 (×27): 15 ug via RESPIRATORY_TRACT
  Filled 2021-05-21 (×27): qty 2

## 2021-05-21 NOTE — Progress Notes (Addendum)
Progress Note    Justin Richmond  LSL:[id number] DOB: Mar 11, 1924  DOA: 05/17/2021 PCP: Johny Blamer, MD      Brief Narrative:    Medical records reviewed and are as summarized below:  Justin Richmond is a 87 y.o. male with medical history significant for interstitial lung disease, complete heart block requiring permanent pacemaker, chronic diastolic CHF, hypertension, hyperlipidemia, overactive bladder, recurrent UTI, who presented to the hospital with cough, shortness of breath, fever, chills for about 3 days duration.  Reportedly, his oxygen saturation 6% on room air when EMS picked him up.  He was admitted to the hospital for severe sepsis secondary to community-acquired pneumonia, acute hypoxic respiratory failure and acute on chronic diastolic CHF.  He was treated with empiric IV antibiotics, oxygen via nasal cannula and IV Lasix.    Assessment/Plan:   Principal Problem:   Severe sepsis (HCC) Active Problems:   ILD (interstitial lung disease) (HCC)   CAP (community acquired pneumonia)   Acute hypoxemic respiratory failure (HCC)   CHF exacerbation (HCC)   Pressure injury of skin   Pressure injury of buttock, stage 2 (HCC)   Hemorrhoid   Body mass index is 21.94 kg/m.    Severe sepsis secondary to community-acquired pneumonia: Continue empiric IV antibiotics.  No growth on blood cultures thus far.    Acute hypoxic respiratory failure: He remains on 4 L/min oxygen via nasal cannula.  Taper off oxygen as able.    Acute on chronic diastolic CHF: He had 1 dose of IV Lasix in the emergency room.  BNP is down from 690-407.  Repeat another dose of IV Lasix.  2D echo showed EF estimated at 50 to 55%, grade 1 diastolic dysfunction  Constipation: Improved  Urinalysis and urine culture are not suggestive of UTI.   Generalized weakness: PT evaluation  Other comorbidities include interstitial lung disease, hypertension, hyperlipidemia, hemorrhoids, s/p permanent  pacemaker for complete heart block   Diet Order             Diet Heart Room service appropriate? Yes; Fluid consistency: Thin; Fluid restriction: 1200 mL Fluid  Diet effective now                      Consultants: None  Procedures: None    Medications:    aspirin EC  81 mg Oral Daily   Budeson-Glycopyrrol-Formoterol  2 puff Inhalation BID   carvedilol  12.5 mg Oral BID WC   docusate sodium  100 mg Oral BID   enoxaparin (LOVENOX) injection  40 mg Subcutaneous Q24H   feeding supplement  237 mL Oral BID BM   furosemide  20 mg Intravenous Once   gabapentin  100 mg Oral QHS   hydrocortisone  25 mg Rectal BID   ipratropium-albuterol  3 mL Nebulization BID   magnesium gluconate  500 mg Oral QHS   methylPREDNISolone (SOLU-MEDROL) injection  60 mg Intravenous Q12H   oxybutynin  5 mg Oral BID   pantoprazole  20 mg Oral Daily   polyethylene glycol  17 g Oral Daily   rOPINIRole  0.25 mg Oral QHS   rosuvastatin  10 mg Oral QHS   vitamin B-12  5,000 mcg Oral Daily   Continuous Infusions:  azithromycin 500 mg (05/21/21 0410)   cefTRIAXone (ROCEPHIN)  IV 1 g (05/21/21 0133)     Anti-infectives (From admission, onward)    Start     Dose/Rate Route Frequency Ordered Stop   05/19/21 0100  cefTRIAXone (  ROCEPHIN) 1 g in sodium chloride 0.9 % 100 mL IVPB        1 g 200 mL/hr over 30 Minutes Intravenous Every 24 hours 05/18/21 0341     05/18/21 0400  azithromycin (ZITHROMAX) 500 mg in sodium chloride 0.9 % 250 mL IVPB        500 mg 250 mL/hr over 60 Minutes Intravenous Every 24 hours 05/18/21 0341     05/17/21 2345  cefTRIAXone (ROCEPHIN) 1 g in sodium chloride 0.9 % 100 mL IVPB        1 g 200 mL/hr over 30 Minutes Intravenous  Once 05/17/21 2333 05/18/21 0116              Family Communication/Anticipated D/C date and plan/Code Status   DVT prophylaxis: enoxaparin (LOVENOX) injection 40 mg Start: 05/18/21 1000     Code Status: DNR  Family Communication:  None Disposition Plan:    Status is: Inpatient  Remains inpatient appropriate because:Inpatient level of care appropriate due to severity of illness  Dispo: The patient is from: Home              Anticipated d/c is to: Home              Patient currently is not medically stable to d/c.   Difficult to place patient No           Subjective:   Interval events noted.  He feels a little better compared to yesterday.  He still feels short of breath.  Objective:    Vitals:   05/20/21 2050 05/21/21 0500 05/21/21 0545 05/21/21 0725  BP: (!) 145/77  (!) 166/94   Pulse: 72  88   Resp: 20  18   Temp: 98 F (36.7 C)  98.2 F (36.8 C)   TempSrc: Oral  Oral   SpO2: 96%  99% 97%  Weight:  59.8 kg     No data found.   Intake/Output Summary (Last 24 hours) at 05/21/2021 1216 Last data filed at 05/21/2021 0913 Gross per 24 hour  Intake 1070 ml  Output 1150 ml  Net -80 ml   Filed Weights   05/19/21 0500 05/20/21 0500 05/21/21 0500  Weight: 61.5 kg 60.2 kg 59.8 kg    Exam:  GEN: NAD SKIN: Stage I coccygeal stage II left buttock decubitus ulcers EYES: No pallor or icterus ENT: MMM CV: RRR PULM: Diffuse bilateral rales.  No wheezing heard. ABD: soft, ND, NT, +BS CNS: AAO x 3, non focal EXT: No edema or tenderness      Data Reviewed:   I have personally reviewed following labs and imaging studies:  Labs: Labs show the following:   Basic Metabolic Panel: Recent Labs  Lab 05/17/21 2244 05/19/21 0452 05/20/21 0407 05/21/21 0424  NA 132* 137 138 139  K 4.1 3.5 3.8 4.1  CL 97* 99 100 104  CO2 25 29 29 28   GLUCOSE 165* 157* 140* 139*  BUN 28* 31* 47* 48*  CREATININE 0.92 0.82 0.76 0.84  CALCIUM 8.8* 8.9 9.1 8.9   GFR Estimated Creatinine Clearance: 45.5 mL/min (by C-G formula based on SCr of 0.84 mg/dL). Liver Function Tests: Recent Labs  Lab 05/17/21 2244  AST 39  ALT 27  ALKPHOS 55  BILITOT 1.0  PROT 7.6  ALBUMIN 3.1*   No results for  input(s): LIPASE, AMYLASE in the last 168 hours. No results for input(s): AMMONIA in the last 168 hours. Coagulation profile Recent Labs  Lab 05/17/21 2244  INR 1.1    CBC: Recent Labs  Lab 05/17/21 2244 05/19/21 0452 05/20/21 0407 05/21/21 0424  WBC 5.9 10.5 13.1* 13.8*  NEUTROABS  --  9.0*  --  10.5*  HGB 11.2* 11.6* 12.0* 10.9*  HCT 34.5* 35.8* 36.9* 34.1*  MCV 93.8 92.0 92.7 94.2  PLT 195 226 234 223   Cardiac Enzymes: No results for input(s): CKTOTAL, CKMB, CKMBINDEX, TROPONINI in the last 168 hours. BNP (last 3 results) No results for input(s): PROBNP in the last 8760 hours. CBG: No results for input(s): GLUCAP in the last 168 hours. D-Dimer: No results for input(s): DDIMER in the last 72 hours. Hgb A1c: No results for input(s): HGBA1C in the last 72 hours. Lipid Profile: No results for input(s): CHOL, HDL, LDLCALC, TRIG, CHOLHDL, LDLDIRECT in the last 72 hours. Thyroid function studies: No results for input(s): TSH, T4TOTAL, T3FREE, THYROIDAB in the last 72 hours.  Invalid input(s): FREET3 Anemia work up: No results for input(s): VITAMINB12, FOLATE, FERRITIN, TIBC, IRON, RETICCTPCT in the last 72 hours. Sepsis Labs: Recent Labs  Lab 05/17/21 2241 05/17/21 2244 05/19/21 0452 05/20/21 0407 05/21/21 0424  PROCALCITON 1.25  --   --   --   --   WBC  --  5.9 10.5 13.1* 13.8*  LATICACIDVEN  --  1.3  --   --   --     Microbiology Recent Results (from the past 240 hour(s))  Culture, blood (Routine x 2)     Status: None (Preliminary result)   Collection Time: 05/17/21 10:44 PM   Specimen: BLOOD  Result Value Ref Range Status   Specimen Description   Final    BLOOD LEFT ANTECUBITAL Performed at [facility], [address] [place], [place] [address]    Special Requests   Final    BOTTLES DRAWN AEROBIC AND ANAEROBIC Blood Culture adequate volume Performed at [facility], [address], [place], [place] [address]     Culture   Final    NO GROWTH 3 DAYS Performed at [facility], [address], [place], [place] [address]    Report Status PENDING  Incomplete  Resp Panel by RT-PCR (Flu A&B, Covid) Nasopharyngeal Swab     Status: None   Collection Time: 05/17/21 10:51 PM   Specimen: Nasopharyngeal Swab; Nasopharyngeal(NP) swabs in vial transport medium  Result Value Ref Range Status   SARS Coronavirus 2 by RT PCR NEGATIVE NEGATIVE Final    Comment: (NOTE) SARS-CoV-2 target nucleic acids are NOT DETECTED.  The SARS-CoV-2 RNA is generally detectable in upper respiratory specimens during the acute phase of infection. The lowest concentration of SARS-CoV-2 viral copies this assay can detect is 138 copies/mL. A negative result does not preclude SARS-Cov-2 infection and should not be used as the sole basis for treatment or other patient management decisions. A negative result may occur with  improper specimen collection/handling, submission of specimen other than nasopharyngeal swab, presence of viral mutation(s) within the areas targeted by this assay, and inadequate number of viral copies(<138 copies/mL). A negative result must be combined with clinical observations, patient history, and epidemiological information. The expected result is Negative.  Fact Sheet for Patients:  BloggerCourse.com  Fact Sheet for Healthcare Providers:  SeriousBroker.it  This test is no t yet approved or cleared by the [place] FDA and  has been authorized for detection and/or diagnosis of SARS-CoV-2 by FDA under an Emergency Use Authorization (EUA). This EUA will remain  in effect (meaning this  test can be used) for the duration of the COVID-19 declaration under Section 564(b)(1) of the Act, 21 U.S.C.section 360bbb-3(b)(1), unless the authorization is terminated  or revoked sooner.       Influenza A by PCR NEGATIVE NEGATIVE Final   Influenza B by PCR  NEGATIVE NEGATIVE Final    Comment: (NOTE) The Xpert Xpress SARS-CoV-2/FLU/RSV plus assay is intended as an aid in the diagnosis of influenza from Nasopharyngeal swab specimens and should not be used as a sole basis for treatment. Nasal washings and aspirates are unacceptable for Xpert Xpress SARS-CoV-2/FLU/RSV testing.  Fact Sheet for Patients: BloggerCourse.comhttps://www.fda.gov/media/152166/download  Fact Sheet for Healthcare Providers: SeriousBroker.ithttps://www.fda.gov/media/152162/download  This test is not yet approved or cleared by the [place] FDA and has been authorized for detection and/or diagnosis of SARS-CoV-2 by FDA under an Emergency Use Authorization (EUA). This EUA will remain in effect (meaning this test can be used) for the duration of the COVID-19 declaration under Section 564(b)(1) of the Act, 21 U.S.C. section 360bbb-3(b)(1), unless the authorization is terminated or revoked.  Performed at [facility], [address] [place], [place] [address]   Blood culture (routine x 2)     Status: None (Preliminary result)   Collection Time: 05/18/21  1:00 AM   Specimen: BLOOD  Result Value Ref Range Status   Specimen Description   Final    BLOOD SITE NOT SPECIFIED Performed at [facility], [address] [place], [place] [address]    Special Requests   Final    BOTTLES DRAWN AEROBIC AND ANAEROBIC Blood Culture adequate volume Performed at [facility], [address] [place], [place] [address]    Culture   Final    NO GROWTH 3 DAYS Performed at [facility], [address] [place], [place] [address]    Report Status PENDING  Incomplete  Urine culture     Status: Abnormal   Collection Time: 05/18/21  5:41 AM   Specimen: Urine, Random  Result Value Ref Range Status   Specimen Description   Final    URINE, RANDOM Performed at [facility], [address] [place], [place] [address]    Special  Requests   Final    NONE Performed at [facility], [address] [place], [place] [address]    Culture (A)  Final    <10,000 COLONIES/mL INSIGNIFICANT GROWTH Performed at [facility], [address] [place], [place] [address]    Report Status 05/19/2021 FINAL  Final    Procedures and diagnostic studies:  No results found.             LOS: 3 days   Justin Richmond  [facility]   Pager on [email or web address]. If 7PM-7AM, please contact night-coverage at [email or web address]     05/21/2021, 12:16 PM

## 2021-05-21 NOTE — Care Management Important Message (Signed)
Important Message  Patient Details IM Letter placed in Patient's room. Name: Justin Richmond MRN: [id number] Date of Birth: [birth year]   Medicare Important Message Given:  Yes     Caren Macadam 05/21/2021, 11:20 AM

## 2021-05-21 NOTE — Progress Notes (Signed)
Physical Therapy Treatment Patient Details Name: Justin Richmond MRN: [id number] DOB: Nov 30, 1923 Today's Date: 05/21/2021    History of Present Illness 87 yo male admitted with Pna, sepsis. 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 He was agreeable to walking a short distance before returning to bed. He requires some encouragement to progress activity. Remained on 4L [place].    Follow Up Recommendations  Home health PT;Supervision/Assistance - 24 hour     Equipment Recommendations  None recommended by PT    Recommendations for Other Services       Precautions / Restrictions Precautions Precautions: Fall Precaution Comments: monitor O2 Restrictions Weight Bearing Restrictions: No    Mobility  Bed Mobility Overal bed mobility: Needs Assistance Bed Mobility: Sit to Supine     Supine to sit: Supervision;HOB elevated Sit to supine: Min guard   General bed mobility comments: Increased time.    Transfers Overall transfer level: Needs assistance Equipment used: Rolling walker (2 wheeled) Transfers: Sit to/from Stand Sit to Stand: Min assist         General transfer comment: Assist to rise from low recliner. Cues for safety.  Ambulation/Gait Ambulation/Gait assistance: Min guard Gait Distance (Feet): 15 Feet Assistive device: Rolling walker (2 wheeled) Gait Pattern/deviations: Step-to pattern     General Gait Details: Pt agreeable to walking a short distance. Remained on [place] O2.   Stairs             Wheelchair Mobility    Modified Rankin (Stroke Patients Only)       Balance Overall balance assessment: Needs assistance         Standing balance support: Bilateral upper extremity supported Standing balance-Leahy Scale: Poor                              Cognition Arousal/Alertness:  Awake/alert Behavior During Therapy: WFL for tasks assessed/performed Overall Cognitive Status: Within Functional Limits for tasks assessed                                        Exercises      General Comments        Pertinent Vitals/Pain Pain Assessment: No/denies pain    Home Living Family/patient expects to be discharged to:: Private residence Living Arrangements: Children Available Help at Discharge: Family Type of Home: House       Home Equipment: Walker - 4 wheels      Prior Function Level of Independence: Independent with assistive device(s)      Comments: uses rollator. per pt, able to perform ADLs without assistance   PT Goals (current goals can now be found in the care plan section) Acute Rehab PT Goals Patient Stated Goal: get better. get home. PT Goal Formulation: With patient Time For Goal Achievement: 06/04/21 Potential to Achieve Goals: Good Progress towards PT goals: Progressing toward goals    Frequency    Min 3X/week      PT Plan Current plan remains appropriate    Co-evaluation              AM-PAC PT "6 Clicks" Mobility   Outcome Measure  Help needed turning from your back to your side while in  a flat bed without using bedrails?: A Little Help needed moving from lying on your back to sitting on the side of a flat bed without using bedrails?: A Little Help needed moving to and from a bed to a chair (including a wheelchair)?: A Little Help needed standing up from a chair using your arms (e.g., wheelchair or bedside chair)?: A Little Help needed to walk in hospital room?: A Little Help needed climbing 3-5 steps with a railing? : A Lot 6 Click Score: 17    End of Session Equipment Utilized During Treatment: Gait belt;Oxygen Activity Tolerance: Patient limited by fatigue Patient left: in bed;with call bell/phone within reach;with bed alarm set Nurse Communication:  (need for assistance off bsc) PT Visit Diagnosis:  Muscle weakness (generalized) (M62.81);Difficulty in walking, not elsewhere classified (R26.2)     Time: 1275-1700 PT Time Calculation (min) (ACUTE ONLY): 15 min  Charges:  $Gait Training: 8-22 mins                         Faye Ramsay, PT Acute Rehabilitation  Office: [phone] Pager: [phone]

## 2021-05-21 NOTE — Evaluation (Signed)
Physical Therapy Evaluation Patient Details Name: Justin Richmond MRN: [id number] DOB: 1924-10-14 Today's Date: 05/21/2021   History of Present Illness  87 yo male admitted with Pna, sepsis. Hx of HB, PPM, CHF, OA, recurrent UTI, chronic pain, scoliosis, L knee fusion/leg length discrepancy, spinal stenosis, ILD  Clinical Impression  On eval, pt required Min guard assistance for mobility. He walked ~ 5 feet with a rW but distance was limited by pt's need to use bsc this session. O2 93% on 4L. Will follow and progress activity as tolerated. Pt would like to resume HHPT.      Home health PT;Supervision/Assistance - 24 hour    Equipment Recommendations  None recommended by PT    Recommendations for Other Services       Precautions / Restrictions Precautions Precautions: Fall Precaution Comments: monitor O2 Restrictions Weight Bearing Restrictions: No      Mobility  Bed Mobility Overal bed mobility: Needs Assistance Bed Mobility: Supine to Sit;Sit to Supine     Supine to sit: Supervision;HOB elevated Sit to supine: Supervision;HOB elevated   General bed mobility comments: Increased time.    Transfers Overall transfer level: Needs assistance Equipment used: Rolling walker (2 wheeled) Transfers: Sit to/from Stand Sit to Stand: Min guard;From elevated surface         General transfer comment: Increased time. Min guard for safety. Cues for safety.  Ambulation/Gait Ambulation/Gait assistance: Min guard Gait Distance (Feet): 5 Feet Assistive device: Rolling walker (2 wheeled) Gait Pattern/deviations: Step-to pattern     General Gait Details: Distance limited by pt's need to use bsc. Deferred further ambulation and assisted pt on bsc. Pt wanted to sit for a while so make RN aware that he would need assistance off bsc once ready.  Stairs            Wheelchair Mobility    Modified Rankin (Stroke Patients Only)       Balance Overall balance assessment:  Needs assistance         Standing balance support: Bilateral upper extremity supported Standing balance-Leahy Scale: Poor                               Pertinent Vitals/Pain Pain Assessment: No/denies pain    Home Living Family/patient expects to be discharged to:: Private residence Living Arrangements: Children Available Help at Discharge: Family Type of Home: House         Home Equipment: Walker - 4 wheels      Prior Function Level of Independence: Independent with assistive device(s)         Comments: uses rollator. per pt, able to perform ADLs without assistance     Hand Dominance        Extremity/Trunk Assessment   Upper Extremity Assessment Upper Extremity Assessment: Generalized weakness    Lower Extremity Assessment Lower Extremity Assessment: Generalized weakness (L LE leg discrepancy (R longer than L), L knee fused in extension)    Cervical / Trunk Assessment Cervical / Trunk Assessment: Kyphotic  Communication   Communication: HOH  Cognition Arousal/Alertness: Awake/alert Behavior During Therapy: WFL for tasks assessed/performed Overall Cognitive Status: Within Functional Limits for tasks assessed                                        General Comments      Exercises  Assessment/Plan    PT Assessment Patient needs continued PT services  PT Problem List Decreased strength;Decreased mobility;Decreased activity tolerance;Decreased balance;Decreased knowledge of use of DME       PT Treatment Interventions DME instruction;Gait training;Therapeutic exercise;Balance training;Functional mobility training;Therapeutic activities;Patient/family education    PT Goals (Current goals can be found in the Care Plan section)  Acute Rehab PT Goals Patient Stated Goal: get better. get home. PT Goal Formulation: With patient Time For Goal Achievement: 06/04/21 Potential to Achieve Goals: Good    Frequency Min  3X/week   Barriers to discharge        Co-evaluation               AM-PAC PT "6 Clicks" Mobility  Outcome Measure Help needed turning from your back to your side while in a flat bed without using bedrails?: A Little Help needed moving from lying on your back to sitting on the side of a flat bed without using bedrails?: A Little Help needed moving to and from a bed to a chair (including a wheelchair)?: A Little Help needed standing up from a chair using your arms (e.g., wheelchair or bedside chair)?: A Little Help needed to walk in hospital room?: A Little Help needed climbing 3-5 steps with a railing? : A Lot 6 Click Score: 17    End of Session Equipment Utilized During Treatment: Gait belt;Oxygen Activity Tolerance: Patient tolerated treatment well Patient left: with call bell/phone within reach (on bsc) Nurse Communication:  (need for assistance off bsc) PT Visit Diagnosis: Muscle weakness (generalized) (M62.81);Difficulty in walking, not elsewhere classified (R26.2)    Time: 1350-1406 PT Time Calculation (min) (ACUTE ONLY): 16 min   Charges:   PT Evaluation $PT Eval Moderate Complexity: 1 Mod          Faye Ramsay, PT Acute Rehabilitation  Office: [phone] Pager: [phone]     Faye Ramsay, PT Acute Rehabilitation  Office: [phone] Pager: [phone]

## 2021-05-21 NOTE — Progress Notes (Signed)
Remote pacemaker transmission.   

## 2021-05-22 DIAGNOSIS — R6520 Severe sepsis without septic shock: Secondary | ICD-10-CM | POA: Diagnosis not present

## 2021-05-22 DIAGNOSIS — A419 Sepsis, unspecified organism: Secondary | ICD-10-CM | POA: Diagnosis not present

## 2021-05-22 LAB — CBC WITH DIFFERENTIAL/PLATELET
Abs Immature Granulocytes: 0.92 10*3/uL — ABNORMAL HIGH (ref 0.00–0.07)
Basophils Absolute: 0.1 10*3/uL (ref 0.0–0.1)
Basophils Relative: 1 %
Eosinophils Absolute: 0 10*3/uL (ref 0.0–0.5)
Eosinophils Relative: 0 %
HCT: 35.1 % — ABNORMAL LOW (ref 39.0–52.0)
Hemoglobin: 11.2 g/dL — ABNORMAL LOW (ref 13.0–17.0)
Immature Granulocytes: 7 %
Lymphocytes Relative: 13 %
Lymphs Abs: 1.8 10*3/uL (ref 0.7–4.0)
MCH: 30.1 pg (ref 26.0–34.0)
MCHC: 31.9 g/dL (ref 30.0–36.0)
MCV: 94.4 fL (ref 80.0–100.0)
Monocytes Absolute: 0.9 10*3/uL (ref 0.1–1.0)
Monocytes Relative: 7 %
Neutro Abs: 9.9 10*3/uL — ABNORMAL HIGH (ref 1.7–7.7)
Neutrophils Relative %: 72 %
Platelets: 260 10*3/uL (ref 150–400)
RBC: 3.72 MIL/uL — ABNORMAL LOW (ref 4.22–5.81)
RDW: 14 % (ref 11.5–15.5)
WBC: 13.6 10*3/uL — ABNORMAL HIGH (ref 4.0–10.5)
nRBC: 0 % (ref 0.0–0.2)

## 2021-05-22 LAB — BASIC METABOLIC PANEL
Anion gap: 8 (ref 5–15)
BUN: 52 mg/dL — ABNORMAL HIGH (ref 8–23)
CO2: 30 mmol/L (ref 22–32)
Calcium: 9 mg/dL (ref 8.9–10.3)
Chloride: 101 mmol/L (ref 98–111)
Creatinine, Ser: 0.95 mg/dL (ref 0.61–1.24)
GFR, Estimated: >60 mL/min (ref >60)
Glucose, Bld: 148 mg/dL — ABNORMAL HIGH (ref 70–99)
Potassium: 3.9 mmol/L (ref 3.5–5.1)
Sodium: 139 mmol/L (ref 135–145)

## 2021-05-22 MED ORDER — METHYLPREDNISOLONE SODIUM SUCC 40 MG IJ SOLR
40.0000 mg | Freq: Two times a day (BID) | INTRAMUSCULAR | Status: AC
  Administered 2021-05-22: 40 mg via INTRAVENOUS
  Filled 2021-05-22: qty 1

## 2021-05-22 MED ORDER — PREDNISONE 20 MG PO TABS
20.0000 mg | ORAL_TABLET | Freq: Every day | ORAL | Status: AC
  Administered 2021-05-23 – 2021-05-27 (×5): 20 mg via ORAL
  Filled 2021-05-22 (×5): qty 1

## 2021-05-22 NOTE — Plan of Care (Signed)
°  Problem: Activity: °Goal: Ability to tolerate increased activity will improve °Outcome: Progressing °  °Problem: Respiratory: °Goal: Ability to maintain adequate ventilation will improve °Outcome: Progressing °Goal: Ability to maintain a clear airway will improve °Outcome: Progressing °  °

## 2021-05-22 NOTE — Progress Notes (Signed)
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  He was seen in the ED and admitted for severe sepsis due to community-acquired pneumonia acute hypoxic respiratory failure acute on chronic diastolic CHF. Subjective: Seen this morning.  Patient complains of being weak.  Assessment & Plan:  Severe sepsis POA due to pneumonia Community-acquired pneumonia Overall clinically improved no fever has some leukocytosis but on a steroid.  No growth on the cultures.  Complete 5 days of antibiotics at least - reasses for antibiotic needs in a.m. Recent Labs  Lab 05/17/21 2241 05/17/21 2244 05/19/21 0452 05/20/21 0407 05/21/21 0424 05/22/21 0441  WBC  --  5.9 10.5 13.1* 13.8* 13.6*  LATICACIDVEN  --  1.3  --   --   --   --   PROCALCITON 1.25  --   --   --   --   --     Acute hypoxic aspiratory failure due to #1 continue to wean oxygen as tolerated.  Not on oxygen normally continue PT OT incentive spirometry pulmonary toileting  Acute on chronic diastolic CHF needing IV Lasix times once in the ED repeat dose was given, echo was reviewed EF 50 to 55% with G1 DD.  Hold off on Lasix for now.  Overall improved  Constipation improved  Generalized weakness continue PT OT advised home health PT  ILD outpatient follow-up  Hypertension: Stable on Coreg  Hyperlipidemia: Continue statin  Complete heart block with pacemaker in place  Hemorrhoid: On Anusol   Diet Order             Diet Heart Room service appropriate? Yes; Fluid consistency: Thin; Fluid restriction: 1200 mL Fluid   Diet effective now                   Nutrition Problem: Increased nutrient needs Etiology: acute illness Signs/Symptoms: estimated needs Interventions: Ensure Enlive (each supplement provides 350kcal and 20 grams of protein) Patient's Body mass index is 21.61 kg/m.  Pressure Injury 05/18/21 Coccyx Mid Stage 1 -  Intact skin with non-blanchable redness of a localized area usually over a bony prominence. (Active)  05/18/21 1700  Location: Coccyx  Location Orientation: Mid  Staging: Stage 1 -  Intact skin with non-blanchable redness of a localized area usually over a bony prominence.  Wound Description (Comments):   Present on Admission: Yes     Pressure Injury 05/18/21 Buttocks Left Stage 2 -  Partial thickness loss of dermis presenting as a shallow open injury with a red, pink wound bed without slough. (Active)  05/18/21 1700  Location: Buttocks  Location Orientation: Left  Staging: Stage 2 -  Partial thickness loss of dermis presenting as a shallow open injury with a red, pink wound bed without slough.  Wound Description (Comments):   Present on Admission: Yes   DVT prophylaxis: enoxaparin (LOVENOX) injection 40 mg Start: 05/18/21 1000 Code Status:   Code Status: DNR  Family Communication: plan of care discussed with patient at bedside. Status is: Inpatient Remains inpatient appropriate because:Inpatient level of care appropriate due to severity of illness Dispo: The patient  is from: Home              Anticipated d/c is to: Home              Patient currently is not medically stable to d/c.   Difficult to place patient No       Unresulted Labs (From admission, onward)    None      Medications reviewed:  Scheduled Meds:  arformoterol  15 mcg Nebulization BID   And   umeclidinium bromide  1 puff Inhalation Daily   And   budesonide (PULMICORT) nebulizer solution  1 mg Nebulization BID   aspirin EC  81 mg Oral Daily   carvedilol  12.5 mg Oral BID WC   docusate  sodium  100 mg Oral BID   enoxaparin (LOVENOX) injection  40 mg Subcutaneous Q24H   feeding supplement  237 mL Oral BID BM   gabapentin  100 mg Oral QHS   hydrocortisone  25 mg Rectal BID   magnesium gluconate  500 mg Oral QHS   methylPREDNISolone (SOLU-MEDROL) injection  60 mg Intravenous Q12H   oxybutynin  5 mg Oral BID   pantoprazole  20 mg Oral Daily   polyethylene glycol  17 g Oral Daily   rOPINIRole  0.25 mg Oral QHS   rosuvastatin  10 mg Oral QHS   vitamin B-12  5,000 mcg Oral Daily   Continuous Infusions:  azithromycin 500 mg (05/22/21 0427)   cefTRIAXone (ROCEPHIN)  IV 1 g (05/22/21 0120)   Consultants:see note  Procedures:see note Antimicrobials: Anti-infectives (From admission, onward)    Start     Dose/Rate Route Frequency Ordered Stop   05/19/21 0100  cefTRIAXone (ROCEPHIN) 1 g in sodium chloride 0.9 % 100 mL IVPB        1 g 200 mL/hr over 30 Minutes Intravenous Every 24 hours 05/18/21 0341     05/18/21 0400  azithromycin (ZITHROMAX) 500 mg in sodium chloride 0.9 % 250 mL IVPB        500 mg 250 mL/hr over 60 Minutes Intravenous Every 24 hours 05/18/21 0341     05/17/21 2345  cefTRIAXone (ROCEPHIN) 1 g in sodium chloride 0.9 % 100 mL IVPB        1 g 200 mL/hr over 30 Minutes Intravenous  Once 05/17/21 2333 05/18/21 0116      Culture/Microbiology    Component Value Date/Time   SDES  05/18/2021 0541    URINE, RANDOM Performed at [facility], [address] [place], [place] [address]    SPECREQUEST  05/18/2021 0541    NONE Performed at [facility], [address] [place], [place] [address]    CULT (A) 05/18/2021 0541    <10,000 COLONIES/mL INSIGNIFICANT GROWTH Performed at [facility], [address] [place], [place] [address]    REPTSTATUS 05/19/2021 FINAL 05/18/2021 0541    Other culture-see note  Objective: Vitals: Today's Vitals   05/21/21 2105 05/22/21 0438 05/22/21 0451 05/22/21 0749  BP:  (!)  142/87    Pulse:  91    Resp:  18    Temp:  98.2 F (36.8 C)    TempSrc:  Oral    SpO2:  92%  93%  Weight:   58.9 kg   PainSc: 0-No pain   Asleep    Intake/Output Summary (Last 24 hours) at 05/22/2021 1305 Last data filed at 05/22/2021 1030 Gross per 24 hour  Intake 790 ml  Output 1875  ml  Net -1085 ml   Filed Weights   05/20/21 0500 05/21/21 0500 05/22/21 0451  Weight: 60.2 kg 59.8 kg 58.9 kg   Weight change: -0.9 kg  Intake/Output from previous day: 07/12 0701 - 07/13 0700 In: 1150 [P.O.:900; IV Piggyback:250] Out: 1625 [Urine:1625] Intake/Output this shift: Total I/O In: 240 [P.O.:240] Out: 450 [Urine:450] Filed Weights   05/20/21 0500 05/21/21 0500 05/22/21 0451  Weight: 60.2 kg 59.8 kg 58.9 kg   Examination: General exam: AAO oriented at baseline, elderly frail ill-appearing but pleasant HEENT:Oral mucosa moist, Ear/Nose WNL grossly,dentition normal. Respiratory system: bilaterally clear breath sounds, no use of accessory muscle, non tender. Cardiovascular system: S1 & S2 +,No JVD. Gastrointestinal system: Abdomen soft, NT,ND, BS+. Nervous System:Alert, awake, moving extremities Extremities: no edema, distal peripheral pulses palpable.  Skin: No rashes,no icterus. MSK: Normal muscle bulk,tone, power Data Reviewed: I have personally reviewed following labs and imaging studies CBC: Recent Labs  Lab 05/17/21 2244 05/19/21 0452 05/20/21 0407 05/21/21 0424 05/22/21 0441  WBC 5.9 10.5 13.1* 13.8* 13.6*  NEUTROABS  --  9.0*  --  10.5* 9.9*  HGB 11.2* 11.6* 12.0* 10.9* 11.2*  HCT 34.5* 35.8* 36.9* 34.1* 35.1*  MCV 93.8 92.0 92.7 94.2 94.4  PLT 195 226 234 223 260   Basic Metabolic Panel: Recent Labs  Lab 05/17/21 2244 05/19/21 0452 05/20/21 0407 05/21/21 0424 05/22/21 0441  NA 132* 137 138 139 139  K 4.1 3.5 3.8 4.1 3.9  CL 97* 99 100 104 101  CO2 25 29 29 28 30   GLUCOSE 165* 157* 140* 139* 148*  BUN 28* 31* 47* 48* 52*  CREATININE 0.92 0.82 0.76  0.84 0.95  CALCIUM 8.8* 8.9 9.1 8.9 9.0   GFR: Estimated Creatinine Clearance: 39.6 mL/min (by C-G formula based on SCr of 0.95 mg/dL). Liver Function Tests: Recent Labs  Lab 05/17/21 2244  AST 39  ALT 27  ALKPHOS 55  BILITOT 1.0  PROT 7.6  ALBUMIN 3.1*   No results for input(s): LIPASE, AMYLASE in the last 168 hours. No results for input(s): AMMONIA in the last 168 hours. Coagulation Profile: Recent Labs  Lab 05/17/21 2244  INR 1.1   Cardiac Enzymes: No results for input(s): CKTOTAL, CKMB, CKMBINDEX, TROPONINI in the last 168 hours. BNP (last 3 results) No results for input(s): PROBNP in the last 8760 hours. HbA1C: No results for input(s): HGBA1C in the last 72 hours. CBG: No results for input(s): GLUCAP in the last 168 hours. Lipid Profile: No results for input(s): CHOL, HDL, LDLCALC, TRIG, CHOLHDL, LDLDIRECT in the last 72 hours. Thyroid Function Tests: No results for input(s): TSH, T4TOTAL, FREET4, T3FREE, THYROIDAB in the last 72 hours. Anemia Panel: No results for input(s): VITAMINB12, FOLATE, FERRITIN, TIBC, IRON, RETICCTPCT in the last 72 hours. Sepsis Labs: Recent Labs  Lab 05/17/21 2241 05/17/21 2244  PROCALCITON 1.25  --   LATICACIDVEN  --  1.3    Recent Results (from the past 240 hour(s))  Culture, blood (Routine x 2)     Status: None (Preliminary result)   Collection Time: 05/17/21 10:44 PM   Specimen: BLOOD  Result Value Ref Range Status   Specimen Description   Final    BLOOD LEFT ANTECUBITAL Performed at [facility], [address] [place], [place]    Special Requests   Final    BOTTLES DRAWN AEROBIC AND ANAEROBIC Blood Culture adequate volume Performed at [facility], [address], [place], [place]  Culture   Final    NO GROWTH 4 DAYS Performed at [facility], [address] [place], [place] [address]    Report Status PENDING  Incomplete  Resp Panel by RT-PCR (Flu  A&B, Covid) Nasopharyngeal Swab     Status: None   Collection Time: 05/17/21 10:51 PM   Specimen: Nasopharyngeal Swab; Nasopharyngeal(NP) swabs in vial transport medium  Result Value Ref Range Status   SARS Coronavirus 2 by RT PCR NEGATIVE NEGATIVE Final    Comment: (NOTE) SARS-CoV-2 target nucleic acids are NOT DETECTED.  The SARS-CoV-2 RNA is generally detectable in upper respiratory specimens during the acute phase of infection. The lowest concentration of SARS-CoV-2 viral copies this assay can detect is 138 copies/mL. A negative result does not preclude SARS-Cov-2 infection and should not be used as the sole basis for treatment or other patient management decisions. A negative result may occur with  improper specimen collection/handling, submission of specimen other than nasopharyngeal swab, presence of viral mutation(s) within the areas targeted by this assay, and inadequate number of viral copies(<138 copies/mL). A negative result must be combined with clinical observations, patient history, and epidemiological information. The expected result is Negative.  Fact Sheet for Patients:  BloggerCourse.com  Fact Sheet for Healthcare Providers:  SeriousBroker.it  This test is no t yet approved or cleared by the [place] FDA and  has been authorized for detection and/or diagnosis of SARS-CoV-2 by FDA under an Emergency Use Authorization (EUA). This EUA will remain  in effect (meaning this test can be used) for the duration of the COVID-19 declaration under Section 564(b)(1) of the Act, 21 U.S.C.section 360bbb-3(b)(1), unless the authorization is terminated  or revoked sooner.       Influenza A by PCR NEGATIVE NEGATIVE Final   Influenza B by PCR NEGATIVE NEGATIVE Final    Comment: (NOTE) The Xpert Xpress SARS-CoV-2/FLU/RSV plus assay is intended as an aid in the diagnosis of influenza from Nasopharyngeal swab specimens  and should not be used as a sole basis for treatment. Nasal washings and aspirates are unacceptable for Xpert Xpress SARS-CoV-2/FLU/RSV testing.  Fact Sheet for Patients: BloggerCourse.com  Fact Sheet for Healthcare Providers: SeriousBroker.it  This test is not yet approved or cleared by the [place] FDA and has been authorized for detection and/or diagnosis of SARS-CoV-2 by FDA under an Emergency Use Authorization (EUA). This EUA will remain in effect (meaning this test can be used) for the duration of the COVID-19 declaration under Section 564(b)(1) of the Act, 21 U.S.C. section 360bbb-3(b)(1), unless the authorization is terminated or revoked.  Performed at [facility], [address] [place], [place] [address]   Blood culture (routine x 2)     Status: None (Preliminary result)   Collection Time: 05/18/21  1:00 AM   Specimen: BLOOD  Result Value Ref Range Status   Specimen Description   Final    BLOOD SITE NOT SPECIFIED Performed at [facility], [address], [place], [place] [address]    Special Requests   Final    BOTTLES DRAWN AEROBIC AND ANAEROBIC Blood Culture adequate volume Performed at [facility], [address], [place], [place] [address]    Culture   Final    NO GROWTH 4 DAYS Performed at [facility], [address] [place], [place] [address]    Report Status PENDING  Incomplete  Urine culture     Status: Abnormal   Collection Time: 05/18/21  5:41 AM   Specimen: Urine, Random  Result Value Ref Range Status   Specimen Description   Final    URINE, RANDOM Performed at [facility], [address], [place], [place] [address]    Special Requests   Final    NONE Performed at [facility], [address], [place], [place] [address]    Culture (A)  Final    <10,000 COLONIES/mL INSIGNIFICANT  GROWTH Performed at [facility], [address] [place], [place] [address]    Report Status 05/19/2021 FINAL  Final     Radiology Studies: No results found.   LOS: 4 days   Lanae Boast, MD [facility]  05/22/2021, 1:05 PM

## 2021-05-22 NOTE — TOC Initial Note (Addendum)
Transition of Care [facility]) - Initial/Assessment Note    Patient Details  Name: Justin Richmond MRN: [id number] Date of Birth: [birth year]  [facility] CM/SW Contact:    Lanier Clam, RN Phone Number: 05/22/2021, 1:49 PM  Clinical Narrative: Sherron Monday to dtr [name] about d/c plans-Patient is active w/[facility]-rep [facility] aware.  2:41p-Received call from dtr [name] asking about a "Jazzy"-powere w/c-informed her that patient was not recc for it, but she could go through the PCP to f/u on it since the insurance requires additional info to [employer] voiced understanding.               Expected Discharge Plan: Home w Home Health Services Barriers to Discharge: No Barriers Identified   Patient Goals and CMS Choice Patient states their goals for this hospitalization and ongoing recovery are:: go home CMS Medicare.gov Compare Post Acute Care list provided to:: Patient Represenative (must comment) (dtr Elizabeth [phone]) Choice offered to / list presented to : Adult Children  Expected Discharge Plan and Services Expected Discharge Plan: Home w Home Health Services   Discharge Planning Services: CM Consult Post Acute Care Choice: Home Health Living arrangements for the past 2 months: Single Family Home                           HH Arranged: PT HH Agency: [facility] Date HH Agency Contacted: 05/22/21 Time HH Agency Contacted: 1348 Representative spoke with at [facility] Agency: Misty Stanley  Prior Living Arrangements/Services Living arrangements for the past 2 months: Single Family Home Lives with:: Adult Children Patient language and need for interpreter reviewed:: Yes Do you feel safe going back to the place where you live?: Yes      Need for Family Participation in Patient Care: No (Comment) Care giver support system in place?: Yes (comment) Current home services: DME, Home PT (rw;Active w/[facility] HHPT) Criminal Activity/Legal Involvement Pertinent to  Current Situation/Hospitalization: No - Comment as needed  Activities of Daily Living Home Assistive Devices/Equipment: Eyeglasses, Hearing aid, Wheelchair, Environmental consultant (specify type) ADL Screening (condition at time of admission) Patient's cognitive ability adequate to safely complete daily activities?: Yes Is the patient deaf or have difficulty hearing?: Yes (does not have hearing aids with him) Does the patient have difficulty seeing, even when wearing glasses/contacts?: No Does the patient have difficulty concentrating, remembering, or making decisions?: No Patient able to express need for assistance with ADLs?: Yes Does the patient have difficulty dressing or bathing?: Yes Independently performs ADLs?: No Communication: Independent Dressing (OT): Needs assistance Is this a change from baseline?: Change from baseline, expected to last <3days Grooming: Needs assistance Is this a change from baseline?: Change from baseline, expected to last <3 days Feeding: Independent Bathing: Needs assistance Is this a change from baseline?: Change from baseline, expected to last <3 days Toileting: Needs assistance Is this a change from baseline?: Change from baseline, expected to last <3 days In/Out Bed: Needs assistance, Independent with device (comment) Is this a change from baseline?: Change from baseline, expected to last <3 days Walks in Home: Independent with device (comment) Does the patient have difficulty walking or climbing stairs?: Yes Weakness of Legs: Both Weakness of Arms/Hands: Both  Permission Sought/Granted Permission sought to share information with : Case Manager Permission granted to share information with : Yes, Verbal Permission Granted  Share Information with NAME: Case manager     Permission granted to share info w Relationship: Elizabeth dtr [phone]     Emotional Assessment Appearance:: Appears stated age Attitude/Demeanor/Rapport: Gracious Affect (typically  observed): Accepting Orientation: : Oriented to Self, Oriented to Place, Oriented to Situation, Oriented to  Time Alcohol / Substance Use: Not Applicable Psych Involvement: No (comment)  Admission diagnosis:  CAP (community acquired pneumonia) [J18.9] Sepsis with acute hypoxic respiratory failure without septic shock, due to unspecified organism (HCC) [A41.9, R65.20, J96.01] Patient Active Problem List   Diagnosis Date Noted   Pressure injury of skin 05/19/2021   Pressure injury of buttock, stage 2 (HCC) 05/19/2021   Hemorrhoid 05/19/2021   CAP (community acquired pneumonia) 05/18/2021   Acute hypoxemic respiratory failure (HCC) 05/18/2021   Severe sepsis (HCC) 05/18/2021   CHF exacerbation (HCC) 05/18/2021   Unstageable pressure ulcer of sacral region (HCC) 05/18/2021   Essential hypertension 03/11/2021   Chronic diastolic CHF (congestive heart failure) (HCC) 03/11/2021   GERD (gastroesophageal reflux disease) 03/11/2021   RLS (restless legs syndrome) 03/11/2021   ILD (interstitial lung disease) (HCC) 03/11/2021   History of complete heart block 03/11/2021   UTI (urinary tract infection) 03/10/2021   Peripheral polyneuropathy 04/02/2020   Spinal stenosis of lumbar region with neurogenic claudication 04/02/2020   PCP:  Johny Blamer, MD Pharmacy:   [facility] [place], [place] - [address] [place] [address] Phone: [phone] Fax: [phone]  [facility] (Now [facility]) - [place], [place] - [address] [place] [address] Phone: [phone] Fax: [phone]     Social Determinants of Health (SDOH) Interventions    Readmission Risk Interventions No flowsheet data found.

## 2021-05-23 DIAGNOSIS — R6520 Severe sepsis without septic shock: Secondary | ICD-10-CM | POA: Diagnosis not present

## 2021-05-23 DIAGNOSIS — A419 Sepsis, unspecified organism: Secondary | ICD-10-CM | POA: Diagnosis not present

## 2021-05-23 LAB — CBC
HCT: 37 % — ABNORMAL LOW (ref 39.0–52.0)
Hemoglobin: 11.7 g/dL — ABNORMAL LOW (ref 13.0–17.0)
MCH: 29.9 pg (ref 26.0–34.0)
MCHC: 31.6 g/dL (ref 30.0–36.0)
MCV: 94.6 fL (ref 80.0–100.0)
Platelets: 263 10*3/uL (ref 150–400)
RBC: 3.91 MIL/uL — ABNORMAL LOW (ref 4.22–5.81)
RDW: 13.8 % (ref 11.5–15.5)
WBC: 19.1 10*3/uL — ABNORMAL HIGH (ref 4.0–10.5)
nRBC: 0 % (ref 0.0–0.2)

## 2021-05-23 LAB — CULTURE, BLOOD (ROUTINE X 2)
Culture: NO GROWTH
Special Requests: ADEQUATE

## 2021-05-23 LAB — PROCALCITONIN: Procalcitonin: 0.11 ng/mL

## 2021-05-23 MED ORDER — FUROSEMIDE 10 MG/ML IJ SOLN
20.0000 mg | Freq: Once | INTRAMUSCULAR | Status: AC
  Administered 2021-05-23: 20 mg via INTRAVENOUS
  Filled 2021-05-23: qty 2

## 2021-05-23 NOTE — Progress Notes (Signed)
SATURATION QUALIFICATIONS: (This note is used to comply with regulatory documentation for home oxygen)  Patient Saturations on Room Air at Rest = 94%  Patient Saturations on Room Air while Transferring= 81% Reapplied 4L while on BSC to achieve 89%  Unable to ambulate due to fatigue.   Please briefly explain why patient needs home oxygen: Pt unable to maintain therapeutic O2 levels on room air during activity.

## 2021-05-23 NOTE — Care Management Important Message (Signed)
Important Message  Patient Details IM Letter placed in Patients room. Name: Kaniel Kiang MRN: [id number] Date of Birth: 06/14/1924   Medicare Important Message Given:  Yes     Caren Macadam 05/23/2021, 3:59 PM

## 2021-05-23 NOTE — Plan of Care (Signed)
  Problem: Education: Goal: Knowledge of General Education information will improve Description: Including pain rating scale, medication(s)/side effects and non-pharmacologic comfort measures 05/23/2021 0804 by Thea Alken, RN Outcome: Progressing 05/23/2021 0803 by Thea Alken, RN Outcome: Progressing   Problem: Activity: Goal: Ability to tolerate increased activity will improve Outcome: Progressing   Problem: Respiratory: Goal: Ability to maintain adequate ventilation will improve Outcome: Progressing Goal: Ability to maintain a clear airway will improve Outcome: Progressing

## 2021-05-23 NOTE — Progress Notes (Signed)
Physical Therapy Treatment Patient Details Name: Justin Richmond MRN: [id number] DOB: 10/18/1924 Today's Date: 05/23/2021    History of Present Illness 87 yo male admitted with Pna, sepsis. Hx of HB, PPM, CHF, OA, recurrent UTI, chronic pain, scoliosis, L knee fusion/leg length discrepancy, spinal stenosis, ILD    PT Comments    SATURATION QUALIFICATIONS: (This note is used to comply with regulatory documentation for home oxygen)   Patient Saturations on Room Air at Rest = 94%   Patient Saturations on Room Air while Transferring= 81% Reapplied 4L while on BSC to achieve 89%   Unable to ambulate due to fatigue.    Please briefly explain why patient needs home oxygen: Pt unable to maintain therapeutic O2 levels on room air during activity.  Pt semi-supine in bed. Pt agreeable to session and to ambulate in hall. Pt required min assist to mobilize LLE to come to sitting on EOB. Min assist +1 to come to standing at EOB to prevent foot from sliding on floor. Pt was able to stand/pivot transfer to [facility]. Pt unable to ambulate due to max complaints of fatigue while on BSC. Pt required min guard to stand/pivot transfer back to bed.    Follow Up Recommendations  Home health PT;Supervision/Assistance - 24 hour     Equipment Recommendations  None recommended by PT    Recommendations for Other Services       Precautions / Restrictions Precautions Precautions: Fall Precaution Comments: monitor O2 Restrictions Weight Bearing Restrictions: No    Mobility  Bed Mobility Overal bed mobility: Needs Assistance Bed Mobility: Sit to Supine     Supine to sit: Min assist;HOB elevated Sit to supine: Min guard   General bed mobility comments: assist needed to mobilize LLE to EOB, increased time    Transfers Overall transfer level: Needs assistance Equipment used: 4-wheeled walker Transfers: Sit to/from Stand Sit to Stand: Min assist         General transfer comment: assist to rise  without feet slipping. Cues for safety.   Ambulation/Gait             General Gait Details: unable to ambulate due to fatigue after toileting   Stairs             Wheelchair Mobility    Modified Rankin (Stroke Patients Only)       Balance Overall balance assessment: Needs assistance         Standing balance support: Bilateral upper extremity supported Standing balance-Leahy Scale: Poor                              Cognition Arousal/Alertness: Awake/alert Behavior During Therapy: WFL for tasks assessed/performed Overall Cognitive Status: Within Functional Limits for tasks assessed                                        Exercises      General Comments        Pertinent Vitals/Pain Pain Assessment: No/denies pain    Home Living                      Prior Function            PT Goals (current goals can now be found in the care plan section) Acute Rehab PT Goals Patient Stated Goal: get better. get home. PT  Goal Formulation: With patient Time For Goal Achievement: 06/04/21 Potential to Achieve Goals: Good Progress towards PT goals: Progressing toward goals    Frequency    Min 3X/week      PT Plan Current plan remains appropriate    Co-evaluation              AM-PAC PT "6 Clicks" Mobility   Outcome Measure  Help needed turning from your back to your side while in a flat bed without using bedrails?: A Little Help needed moving from lying on your back to sitting on the side of a flat bed without using bedrails?: A Little Help needed moving to and from a bed to a chair (including a wheelchair)?: A Little Help needed standing up from a chair using your arms (e.g., wheelchair or bedside chair)?: A Little Help needed to walk in hospital room?: A Little Help needed climbing 3-5 steps with a railing? : A Lot 6 Click Score: 17    End of Session Equipment Utilized During Treatment: Gait  belt;Oxygen Activity Tolerance: Patient limited by fatigue Patient left: in bed;with call bell/phone within reach;with bed alarm set   PT Visit Diagnosis: Muscle weakness (generalized) (M62.81);Difficulty in walking, not elsewhere classified (R26.2)     Time:  -     Charges:                        Alma Friendly, PTA Student  Acute Rehabilitation Services Pager : [phone] Office : [phone]    Alma Friendly 05/23/2021, 12:07 PM

## 2021-05-23 NOTE — Progress Notes (Signed)
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  He was seen in the ED and admitted for severe sepsis due to community-acquired pneumonia acute hypoxic respiratory failure acute on chronic diastolic CHF. Subjective:  No chest pain, but feels the same, has shortness of breath, on 4l [place] at spo2 at 94 per bedside RN, working on weaning o2.  Assessment & Plan:  Severe sepsis POA due to pneumonia Community-acquired pneumonia Some shortness of breath- completing 5 days azithro, cont rest of the meds. Add IS, PTOT ambulation, lasix x as below, on steroid changed to po and weaning Recent Labs  Lab 05/17/21 2241 05/17/21 2244 05/19/21 0452 05/20/21 0407 05/21/21 0424 05/22/21 0441  WBC  --  5.9 10.5 13.1* 13.8* 13.6*  LATICACIDVEN  --  1.3  --   --   --   --   PROCALCITON 1.25  --   --   --   --   --     Acute hypoxic aspiratory failure due to #1 continue to wean oxygen as tolerated.  Not on oxygen normally continue PT OT incentive spirometry pulmonary toiletting. On 4l [place]- will try lasix iv x1 and wean o2, add IS.  Acute on chronic diastolic CHF needing IV Lasix times once in the ED repeat dose was given, echo was reviewed EF 50 to 55% with G1 DD.  Has b/ lcrackles- ordering lasix 20 mv iv x1. Filed Weights   05/21/21 0500 05/22/21 0451 05/23/21 0600  Weight: 59.8 kg 58.9 kg 59.3 kg    Intake/Output Summary (Last 24 hours) at 05/23/2021 0931 Last data filed at 05/23/2021 0931 Gross per 24 hour  Intake 1320 ml  Output 1775 ml  Net -455 ml     Constipation  improved  Generalized weakness still weak- cont PT OT   ILD outpatient follow-up  Hypertension: controlled on Coreg  Hyperlipidemia: Continue statin  Complete heart block with pacemaker in place  Hemorrhoid: cont his Anusol  Diet Order             Diet Heart Room service appropriate? Yes; Fluid consistency: Thin; Fluid restriction: 1200 mL Fluid  Diet effective now                   Nutrition Problem: Increased nutrient needs Etiology: acute illness Signs/Symptoms: estimated needs Interventions: Ensure Enlive (each supplement provides 350kcal and 20 grams of protein) Patient's Body mass index is 19.88 kg/m.  Pressure Injury 05/18/21 Coccyx Mid Stage 1 -  Intact skin with non-blanchable redness of a localized area usually over a bony prominence. (Active)  05/18/21 1700  Location: Coccyx  Location Orientation: Mid  Staging: Stage 1 -  Intact skin with non-blanchable redness of a localized area usually over a bony prominence.  Wound Description (Comments):   Present on Admission: Yes     Pressure Injury 05/18/21 Buttocks Left Stage 2 -  Partial thickness loss of dermis presenting as a shallow open injury with a red, pink wound bed without slough. (Active)  05/18/21 1700  Location: Buttocks  Location Orientation: Left  Staging: Stage 2 -  Partial thickness loss of dermis presenting as a shallow open injury with a red, pink wound bed without slough.  Wound Description (Comments):   Present on Admission: Yes   DVT prophylaxis: enoxaparin (LOVENOX) injection 40 mg Start: 05/18/21 1000 Code Status:   Code Status: DNR  Family Communication: plan of care discussed with patient at bedside and updated daughter. Status is: Inpatient Remains inpatient appropriate because:Inpatient level of care appropriate due to severity of illness Dispo: The patient is from: Home              Anticipated d/c is to: Home tomorrow hopefully once oxx o2 and resp status stable               Patient currently is not medically stable to d/c.   Difficult to place patient No  Unresulted Labs (From admission, onward)    None      Medications reviewed:  Scheduled Meds:  arformoterol  15 mcg Nebulization BID   And   umeclidinium bromide  1 puff Inhalation Daily   And   budesonide (PULMICORT) nebulizer solution  1 mg Nebulization BID   aspirin EC  81 mg Oral Daily   carvedilol  12.5 mg Oral BID WC   docusate sodium  100 mg Oral BID   enoxaparin (LOVENOX) injection  40 mg Subcutaneous Q24H   feeding supplement  237 mL Oral BID BM   gabapentin  100 mg Oral QHS   hydrocortisone  25 mg Rectal BID   magnesium gluconate  500 mg Oral QHS   oxybutynin  5 mg Oral BID   pantoprazole  20 mg Oral Daily   polyethylene glycol  17 g Oral Daily   predniSONE  20 mg Oral Q breakfast   rOPINIRole  0.25 mg Oral QHS   rosuvastatin  10 mg Oral QHS   vitamin B-12  5,000 mcg Oral Daily   Continuous Infusions:  cefTRIAXone (ROCEPHIN)  IV 1 g (05/23/21 0215)   Consultants:see note  Procedures:see note Antimicrobials: Anti-infectives (From admission, onward)    Start     Dose/Rate Route Frequency Ordered Stop   05/19/21 0100  cefTRIAXone (ROCEPHIN) 1 g in sodium chloride 0.9 % 100 mL IVPB        1 g 200 mL/hr over 30 Minutes Intravenous Every 24 hours 05/18/21 0341     05/18/21 0400  azithromycin (ZITHROMAX) 500 mg in sodium chloride 0.9 % 250 mL IVPB  Status:  Discontinued        500 mg 250 mL/hr over 60 Minutes Intravenous Every 24 hours 05/18/21 0341 05/23/21 0716   05/17/21 2345  cefTRIAXone (ROCEPHIN) 1 g in sodium chloride 0.9 % 100 mL IVPB        1 g 200 mL/hr over 30 Minutes Intravenous  Once 05/17/21 2333 05/18/21 0116      Culture/Microbiology    Component Value Date/Time   SDES  05/18/2021 0541    URINE, RANDOM Performed at [facility], [address], [place], [place] [address]    SPECREQUEST  05/18/2021 0541    NONE Performed at [facility], [address], [place], [place] [address]    CULT (A) 05/18/2021 0541    <10,000 COLONIES/mL INSIGNIFICANT GROWTH Performed at [facility], [address] [place], [place] [address]    REPTSTATUS 05/19/2021 FINAL 05/18/2021 0541    Other culture-see note  Objective: Vitals: Today's Vitals   05/23/21 0601 05/23/21 0758 05/23/21 0759 05/23/21 9147  BP: (!) 158/87     Pulse: 68     Resp: 18     Temp: 98.3 F (36.8 C)     TempSrc: Oral     SpO2: 93%  96% 96%  Weight:      Height:      PainSc:  0-No pain      Intake/Output Summary (Last 24 hours) at 05/23/2021 0930 Last data filed at 05/23/2021 8119 Gross per 24 hour  Intake 840 ml  Output 1200 ml  Net -360 ml    Filed Weights   05/21/21 0500 05/22/21 0451 05/23/21 0600  Weight: 59.8 kg 58.9 kg 59.3 kg   Weight change: 0.4 kg  Intake/Output from previous day: 07/13 0701 - 07/14 0700 In: 840 [P.O.:840] Out: 1650 [Urine:1650] Intake/Output this shift: No intake/output data recorded. Filed Weights   05/21/21 0500 05/22/21 0451 05/23/21 0600  Weight: 59.8 kg 58.9 kg 59.3 kg   Examination: General exam: AAOx 3, weak, frail, on 4l [place] HEENT:Oral mucosa moist, Ear/Nose WNL grossly, dentition normal. Respiratory system: bilaterally crackles present on posterior lung, no use of accessory muscle Cardiovascular system: S1 & S2 +, No JVD,. Gastrointestinal system: Abdomen soft,flat NT,ND, BS+ Nervous System:Alert, awake, moving extremities and grossly nonfocal Extremities: no leg edema, distal peripheral pulses palpable.  Skin: No rashes,no icterus. MSK: Normal muscle bulk,tone, power  Data Reviewed: I have personally reviewed following labs and imaging studies CBC: Recent Labs  Lab 05/17/21 2244 05/19/21 0452 05/20/21 0407 05/21/21 0424 05/22/21 0441  WBC 5.9 10.5 13.1* 13.8* 13.6*  NEUTROABS  --  9.0*  --  10.5* 9.9*  HGB 11.2* 11.6* 12.0* 10.9* 11.2*  HCT 34.5* 35.8* 36.9* 34.1*  35.1*  MCV 93.8 92.0 92.7 94.2 94.4  PLT 195 226 234 223 260    Basic Metabolic Panel: Recent Labs  Lab 05/17/21 2244 05/19/21 0452 05/20/21 0407 05/21/21 0424 05/22/21 0441  NA 132* 137 138 139 139  K 4.1 3.5 3.8 4.1 3.9  CL 97* 99 100 104 101  CO2 25 29 29 28 30   GLUCOSE 165* 157* 140* 139* 148*  BUN 28* 31* 47* 48* 52*  CREATININE 0.92 0.82 0.76 0.84 0.95  CALCIUM 8.8* 8.9 9.1 8.9 9.0    GFR: Estimated Creatinine Clearance: 39.9 mL/min (by C-G formula based on SCr of 0.95 mg/dL). Liver Function Tests: Recent Labs  Lab 05/17/21 2244  AST 39  ALT 27  ALKPHOS 55  BILITOT 1.0  PROT 7.6  ALBUMIN 3.1*    No results for input(s): LIPASE, AMYLASE in the last 168 hours. No results for input(s): AMMONIA in the last 168 hours. Coagulation Profile: Recent Labs  Lab 05/17/21 2244  INR 1.1    Cardiac Enzymes: No results for input(s): CKTOTAL, CKMB, CKMBINDEX, TROPONINI in the last 168 hours. BNP (last 3 results) No results for input(s): PROBNP in the last 8760 hours. HbA1C: No results for input(s): HGBA1C in the last 72 hours. CBG: No results for input(s): GLUCAP in the last 168 hours. Lipid Profile: No results for input(s): CHOL, HDL, LDLCALC, TRIG, CHOLHDL, LDLDIRECT in the last 72 hours. Thyroid Function Tests: No results for input(s): TSH, T4TOTAL, FREET4, T3FREE, THYROIDAB in the last 72 hours. Anemia Panel: No results for input(s): VITAMINB12, FOLATE, FERRITIN, TIBC, IRON, RETICCTPCT in the last 72 hours. Sepsis Labs: Recent Labs  Lab 05/17/21 2241 05/17/21 2244  PROCALCITON 1.25  --   LATICACIDVEN  --  1.3     Recent Results (from the past 240 hour(s))  Culture, blood (Routine x 2)     Status: None   Collection Time: 05/17/21 10:44 PM   Specimen: BLOOD  Result Value Ref Range Status   Specimen Description   Final    BLOOD LEFT ANTECUBITAL Performed at [facility], [address], [place], [place] [address]    Special  Requests   Final    BOTTLES DRAWN AEROBIC AND ANAEROBIC Blood Culture adequate volume Performed at [facility], [address], [place], [place] [address]    Culture   Final    NO GROWTH 5 DAYS Performed at [facility], [address], [place], [place] [address]    Report Status 05/23/2021 FINAL  Final  Resp Panel by RT-PCR (Flu A&B, Covid) Nasopharyngeal Swab     Status: None   Collection Time: 05/17/21 10:51 PM   Specimen: Nasopharyngeal Swab; Nasopharyngeal(NP) swabs in vial transport medium  Result Value Ref Range Status   SARS Coronavirus 2 by RT PCR NEGATIVE NEGATIVE Final    Comment: (NOTE) SARS-CoV-2 target nucleic acids are NOT DETECTED.  The SARS-CoV-2 RNA is generally detectable in upper respiratory specimens during the acute phase of infection. The lowest concentration of SARS-CoV-2 viral copies this assay can detect is 138 copies/mL. A negative result does not preclude SARS-Cov-2 infection and should not be used as the sole basis for treatment or other patient management decisions. A negative result may occur with  improper specimen collection/handling, submission of specimen other than nasopharyngeal swab, presence of viral mutation(s) within the areas targeted by this assay, and inadequate number of viral copies(<138 copies/mL). A negative result must be combined with clinical observations, patient history, and epidemiological information. The expected result is Negative.  Fact Sheet for Patients:  BloggerCourse.com  Fact Sheet for Healthcare Providers:  SeriousBroker.it  This test is no t yet approved or cleared by the [place] FDA and  has been authorized for detection and/or diagnosis of SARS-CoV-2 by FDA under an Emergency Use Authorization (EUA). This EUA will remain  in effect (meaning this test can be used) for the duration of the COVID-19 declaration under Section 564(b)(1)  of the Act, 21 U.S.C.section 360bbb-3(b)(1), unless the authorization is terminated  or revoked sooner.       Influenza A by PCR NEGATIVE NEGATIVE Final   Influenza B by PCR NEGATIVE NEGATIVE Final    Comment: (NOTE) The Xpert Xpress SARS-CoV-2/FLU/RSV plus assay is intended as an aid in the diagnosis of influenza from Nasopharyngeal swab specimens and should not be used as a sole basis for treatment. Nasal washings and aspirates are unacceptable for Xpert Xpress SARS-CoV-2/FLU/RSV testing.  Fact Sheet for Patients: BloggerCourse.com  Fact Sheet for Healthcare Providers: SeriousBroker.it  This test is not yet approved or cleared by the [place] FDA and has been authorized for detection and/or diagnosis of SARS-CoV-2 by FDA under an Emergency Use Authorization (EUA). This EUA will remain in effect (meaning this test can be used) for the duration of the COVID-19 declaration under Section 564(b)(1) of the Act, 21 U.S.C. section 360bbb-3(b)(1), unless the authorization is terminated or revoked.  Performed at [facility], [address], [place], [place] [address]   Blood culture (routine x 2)     Status: None   Collection Time: 05/18/21  1:00 AM   Specimen: BLOOD  Result Value Ref Range Status   Specimen Description   Final    BLOOD SITE NOT SPECIFIED Performed at [facility], [address] [place], [place] [address]    Special Requests   Final    BOTTLES DRAWN AEROBIC AND ANAEROBIC Blood Culture adequate volume Performed at [facility], [address] [place], [place] [address]    Culture   Final    NO GROWTH 5 DAYS Performed at [facility], [address] [place], [place] [address]    Report Status 05/23/2021 FINAL  Final  Urine culture     Status: Abnormal   Collection Time: 05/18/21  5:41 AM   Specimen: Urine, Random  Result Value Ref Range  Status   Specimen Description   Final    URINE, RANDOM Performed at [facility], [address] [place], [place] [address]    Special Requests   Final    NONE Performed at [facility], [address] [place], [place] [address]    Culture (A)  Final    <10,000 COLONIES/mL INSIGNIFICANT GROWTH Performed at [facility], [address] [place], [place] [address]    Report Status 05/19/2021 FINAL  Final      Radiology Studies: No results found.   LOS: 5 days   Lanae Boastamesh Eirene Rather, MD [facility]  05/23/2021, 9:30 AM

## 2021-05-24 ENCOUNTER — Inpatient Hospital Stay (HOSPITAL_COMMUNITY): Payer: Medicare Other

## 2021-05-24 DIAGNOSIS — A419 Sepsis, unspecified organism: Secondary | ICD-10-CM | POA: Diagnosis not present

## 2021-05-24 DIAGNOSIS — R6520 Severe sepsis without septic shock: Secondary | ICD-10-CM | POA: Diagnosis not present

## 2021-05-24 LAB — CBC
HCT: 36.7 % — ABNORMAL LOW (ref 39.0–52.0)
Hemoglobin: 11.9 g/dL — ABNORMAL LOW (ref 13.0–17.0)
MCH: 31 pg (ref 26.0–34.0)
MCHC: 32.4 g/dL (ref 30.0–36.0)
MCV: 95.6 fL (ref 80.0–100.0)
Platelets: 286 10*3/uL (ref 150–400)
RBC: 3.84 MIL/uL — ABNORMAL LOW (ref 4.22–5.81)
RDW: 13.9 % (ref 11.5–15.5)
WBC: 13.4 10*3/uL — ABNORMAL HIGH (ref 4.0–10.5)
nRBC: 0 % (ref 0.0–0.2)

## 2021-05-24 LAB — PROCALCITONIN: Procalcitonin: <0.1 ng/mL

## 2021-05-24 NOTE — Progress Notes (Signed)
Physical Therapy  SATURATION QUALIFICATIONS: (This note is used to comply with regulatory documentation for home oxygen)  Patient Saturations on Room Air at Rest = 90%  Patient Saturations on Room Air while Transferring = 79% Reapplied 4L while on BSC to achieve 92%  Unable to ambulate due to max complaints of weakness and dizziness and BP drop to 79/49  Please briefly explain why patient needs home oxygen: Pt unable to maintain therapeutic O2 levels on room air during activity.

## 2021-05-24 NOTE — Progress Notes (Signed)
PHARMACY - PHYSICIAN COMMUNICATION CRITICAL VALUE ALERT - BLOOD CULTURE IDENTIFICATION (BCID)  Justin Richmond is an 87 y.o. male who presented to [facility] on 05/17/2021 with a chief complaint of SOB  Assessment:  completed 5 days of abx for PNA     Name of physician (or Provider) Contacted: Dr. Jonathon Bellows  Current antibiotics: none, course completed  Changes to prescribed antibiotics recommended:  Likely contaminant, no further abx needed.   No results found for this or any previous visit.   Justin Richmond, PharmD, BCPS 05/24/2021 2:29 PM

## 2021-05-24 NOTE — Progress Notes (Signed)
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  He was seen in the ED and admitted for severe sepsis due to community-acquired pneumonia acute hypoxic respiratory failure acute on chronic diastolic CHF. Patient treated with iv antibiotics   Also managed with IV Lasix supplemental oxygen. Seen by PT OT initially home health PT was advised.  Subsequently patient was dizzy with walking and weak advised skilled nursing facility this  Subjective:  Alert awake still feels very weak. While working with PT he was dizzy and hypotensive-we stopped his Coreg  Assessment & Plan:  Severe sepsis POA due to pneumonia Community-acquired pneumonia Completed azithromycin.  We will continue incentive spirometry ceftriaxone pulmonary toileting.  Leukocytosis improving procalcitonin normalized.  Continue p.o. prednisone taper continue PT OT Recent Labs  Lab 05/17/21 2241 05/17/21 2244 05/19/21 0452 05/20/21 0407 05/21/21 0424 05/22/21 0441 05/23/21 1156 05/23/21 1313 05/24/21 0419  WBC  --  5.9   < > 13.1* 13.8* 13.6* 19.1*  --  13.4*  LATICACIDVEN  --  1.3  --   --   --   --   --   --   --   PROCALCITON 1.25  --   --   --   --   --   --  0.11 <0.10   < > = values in this interval not displayed.    Acute hypoxic aspiratory failure due to #1 was on 4 L of cannula now down to 2 L.  Continue to work on weaning oxygen continue PT OT I-S ambulation.    Acute on chronic diastolic CHF needing IV Lasix times once in the ED repeat dose was given next day again repeated 7/14.  Patient is  hypertensive orthostatic today we will hold off further Lasix Coreg has been held.  He does have crackles in the lung but has ILD. Filed Weights   05/22/21 0451 05/23/21 0600 05/24/21 0500  Weight: 58.9 kg 59.3 kg 59 kg    Intake/Output Summary (Last 24 hours) at 05/24/2021 1312 Last data filed at 05/23/2021 2043 Gross per 24 hour  Intake --  Output 1525 ml  Net -1525 ml    Continue hold further Lasix and stop Coreg for now.    Constipation improved  Generalized weakness PT OT, PT OT assisted skilled nursing facility.  ILD outpatient follow-up.  Patient has crackles posteriorly.  Hypertension: Holding Coreg due to dizziness orthostasis   Hyperlipidemia: Continue statin  Complete heart block with pacemaker in place  Hemorrhoid: cont his Anusol  Diet Order             Diet Heart Room service appropriate? Yes; Fluid consistency: Thin; Fluid restriction: 1200 mL Fluid  Diet effective now                   Nutrition Problem: Increased nutrient needs Etiology: acute illness Signs/Symptoms: estimated needs Interventions: Ensure Enlive (each supplement provides 350kcal and 20 grams of protein) Patient's Body mass index is 19.78 kg/m.  Pressure Injury 05/18/21 Coccyx Mid Stage 1 -  Intact skin with non-blanchable redness of a localized area usually  over a bony prominence. (Active)  05/18/21 1700  Location: Coccyx  Location Orientation: Mid  Staging: Stage 1 -  Intact skin with non-blanchable redness of a localized area usually over a bony prominence.  Wound Description (Comments):   Present on Admission: Yes     Pressure Injury 05/18/21 Buttocks Left Stage 2 -  Partial thickness loss of dermis presenting as a shallow open injury with a red, pink wound bed without slough. (Active)  05/18/21 1700  Location: Buttocks  Location Orientation: Left  Staging: Stage 2 -  Partial thickness loss of dermis presenting as a shallow open injury with a red, pink wound bed without  slough.  Wound Description (Comments):   Present on Admission: Yes   DVT prophylaxis: enoxaparin (LOVENOX) injection 40 mg Start: 05/18/21 1000 Code Status:   Code Status: DNR  Family Communication: plan of care discussed with patient at bedside and updated daughter. Status is: Inpatient Remains inpatient appropriate because:Inpatient level of care appropriate due to severity of illness Dispo: The patient is from: Home              Anticipated d/c is to: PT advised SNF              Patient currently is not medically stable to d/c.   Difficult to place patient No  Unresulted Labs (From admission, onward)     Start     Ordered   05/24/21 0500  CBC  Daily,   R     Question:  Specimen collection method  Answer:  Lab=Lab collect   05/23/21 1133   05/24/21 0500  Procalcitonin  Daily,   R     Question:  Specimen collection method  Answer:  Lab=Lab collect   05/23/21 1242           Medications reviewed:  Scheduled Meds:  arformoterol  15 mcg Nebulization BID   And   umeclidinium bromide  1 puff Inhalation Daily   And   budesonide (PULMICORT) nebulizer solution  1 mg Nebulization BID   aspirin EC  81 mg Oral Daily   docusate sodium  100 mg Oral BID   enoxaparin (LOVENOX) injection  40 mg Subcutaneous Q24H   feeding supplement  237 mL Oral BID BM   gabapentin  100 mg Oral QHS   hydrocortisone  25 mg Rectal BID   magnesium gluconate  500 mg Oral QHS   oxybutynin  5 mg Oral BID   pantoprazole  20 mg Oral Daily   polyethylene glycol  17 g Oral Daily   predniSONE  20 mg Oral Q breakfast   rOPINIRole  0.25 mg Oral QHS   rosuvastatin  10 mg Oral QHS   vitamin B-12  5,000 mcg Oral Daily   Continuous Infusions:  cefTRIAXone (ROCEPHIN)  IV 1 g (05/24/21 0027)   Consultants:see note  Procedures:see note Antimicrobials: Anti-infectives (From admission, onward)    Start     Dose/Rate Route Frequency Ordered Stop   05/19/21 0100  cefTRIAXone (ROCEPHIN) 1 g in sodium chloride 0.9 %  100 mL IVPB        1 g 200 mL/hr over 30 Minutes Intravenous Every 24 hours 05/18/21 0341     05/18/21 0400  azithromycin (ZITHROMAX) 500 mg in sodium chloride 0.9 % 250 mL IVPB  Status:  Discontinued        500 mg 250 mL/hr over 60 Minutes Intravenous Every 24 hours 05/18/21 0341 05/23/21 0716   05/17/21 2345  cefTRIAXone (ROCEPHIN) 1  g in sodium chloride 0.9 % 100 mL IVPB        1 g 200 mL/hr over 30 Minutes Intravenous  Once 05/17/21 2333 05/18/21 0116      Culture/Microbiology    Component Value Date/Time   SDES  05/18/2021 0541    URINE, RANDOM Performed at [facility], [address] [place], [place] [address]    SPECREQUEST  05/18/2021 0541    NONE Performed at [facility], [address] [place], [place] [address]    CULT (A) 05/18/2021 0541    <10,000 COLONIES/mL INSIGNIFICANT GROWTH Performed at [facility], [address], [place], [place] [address]    REPTSTATUS 05/19/2021 FINAL 05/18/2021 0541    Other culture-see note  Objective: Vitals: Today's Vitals   05/24/21 0455 05/24/21 0500 05/24/21 0734 05/24/21 0809  BP: (!) 141/72     Pulse: 74     Resp: 18     Temp: 98.4 F (36.9 C)     TempSrc: Oral     SpO2: 96%   96%  Weight:  59 kg    Height:      PainSc:   0-No pain     Intake/Output Summary (Last 24 hours) at 05/24/2021 1312 Last data filed at 05/23/2021 2043 Gross per 24 hour  Intake --  Output 1525 ml  Net -1525 ml    Filed Weights   05/22/21 0451 05/23/21 0600 05/24/21 0500  Weight: 58.9 kg 59.3 kg 59 kg   Weight change: -0.3 kg  Intake/Output from previous day: 07/14 0701 - 07/15 0700 In: 480 [P.O.:480] Out: 2100 [Urine:2100] Intake/Output this shift: No intake/output data recorded. Filed Weights   05/22/21 0451 05/23/21 0600 05/24/21 0500  Weight: 58.9 kg 59.3 kg 59 kg   Examination: General exam: AAOx3, elderly frail and weak  HEENT:Oral mucosa moist, Ear/Nose WNL grossly,  dentition normal. Respiratory system: bilaterally diminished,no use of accessory muscle Cardiovascular system: S1 & S2 +, No JVD. Gastrointestinal system: Abdomen soft,NT,ND, BS+ Nervous System:Alert, awake, moving extremities and grossly nonfocal Extremities: no edema, distal peripheral pulses palpable.  Skin: No rashes,no icterus. MSK: Normal muscle bulk,tone, power  Data Reviewed: I have personally reviewed following labs and imaging studies CBC: Recent Labs  Lab 05/19/21 0452 05/20/21 0407 05/21/21 0424 05/22/21 0441 05/23/21 1156 05/24/21 0419  WBC 10.5 13.1* 13.8* 13.6* 19.1* 13.4*  NEUTROABS 9.0*  --  10.5* 9.9*  --   --   HGB 11.6* 12.0* 10.9* 11.2* 11.7* 11.9*  HCT 35.8* 36.9* 34.1* 35.1* 37.0* 36.7*  MCV 92.0 92.7 94.2 94.4 94.6 95.6  PLT 226 234 223 260 263 286    Basic Metabolic Panel: Recent Labs  Lab 05/17/21 2244 05/19/21 0452 05/20/21 0407 05/21/21 0424 05/22/21 0441  NA 132* 137 138 139 139  K 4.1 3.5 3.8 4.1 3.9  CL 97* 99 100 104 101  CO2 GLUCOSE 165* 157* 140* 139* 148*  BUN 28* 31* 47* 48* 52*  CREATININE 0.92 0.82 0.76 0.84 0.95  CALCIUM 8.8* 8.9 9.1 8.9 9.0    GFR: Estimated Creatinine Clearance: 39.7 mL/min (by C-G formula based on SCr of 0.95 mg/dL). Liver Function Tests: Recent Labs  Lab 05/17/21 2244  AST 39  ALT 27  ALKPHOS 55  BILITOT 1.0  PROT 7.6  ALBUMIN 3.1*    No results for input(s): LIPASE, AMYLASE in the last 168 hours. No results for input(s): AMMONIA in the last 168 hours.  Coagulation Profile: Recent Labs  Lab 05/17/21 2244  INR 1.1    Cardiac Enzymes: No results for input(s): CKTOTAL, CKMB, CKMBINDEX, TROPONINI in the last 168 hours. BNP (last 3 results) No results for input(s): PROBNP in the last 8760 hours. HbA1C: No results for input(s): HGBA1C in the last 72 hours. CBG: No results for input(s): GLUCAP in the last 168 hours. Lipid Profile: No results for input(s): CHOL, HDL, LDLCALC,  TRIG, CHOLHDL, LDLDIRECT in the last 72 hours. Thyroid Function Tests: No results for input(s): TSH, T4TOTAL, FREET4, T3FREE, THYROIDAB in the last 72 hours. Anemia Panel: No results for input(s): VITAMINB12, FOLATE, FERRITIN, TIBC, IRON, RETICCTPCT in the last 72 hours. Sepsis Labs: Recent Labs  Lab 05/17/21 2241 05/17/21 2244 05/23/21 1313 05/24/21 0419  PROCALCITON 1.25  --  0.11 <0.10  LATICACIDVEN  --  1.3  --   --      Recent Results (from the past 240 hour(s))  Culture, blood (Routine x 2)     Status: None   Collection Time: 05/17/21 10:44 PM   Specimen: BLOOD  Result Value Ref Range Status   Specimen Description   Final    BLOOD LEFT ANTECUBITAL Performed at [facility], [address], [place], [place] [address]    Special Requests   Final    BOTTLES DRAWN AEROBIC AND ANAEROBIC Blood Culture adequate volume Performed at [facility], [address], [place], [place] [address]    Culture   Final    NO GROWTH 5 DAYS Performed at [facility], [address], [place], [place] [address]    Report Status 05/23/2021 FINAL  Final  Resp Panel by RT-PCR (Flu A&B, Covid) Nasopharyngeal Swab     Status: None   Collection Time: 05/17/21 10:51 PM   Specimen: Nasopharyngeal Swab; Nasopharyngeal(NP) swabs in vial transport medium  Result Value Ref Range Status   SARS Coronavirus 2 by RT PCR NEGATIVE NEGATIVE Final    Comment: (NOTE) SARS-CoV-2 target nucleic acids are NOT DETECTED.  The SARS-CoV-2 RNA is generally detectable in upper respiratory specimens during the acute phase of infection. The lowest concentration of SARS-CoV-2 viral copies this assay can detect is 138 copies/mL. A negative result does not preclude SARS-Cov-2 infection and should not be used as the sole basis for treatment or other patient management decisions. A negative result may occur with  improper specimen collection/handling, submission of specimen  other than nasopharyngeal swab, presence of viral mutation(s) within the areas targeted by this assay, and inadequate number of viral copies(<138 copies/mL). A negative result must be combined with clinical observations, patient history, and epidemiological information. The expected result is Negative.  Fact Sheet for Patients:  BloggerCourse.com  Fact Sheet for Healthcare Providers:  SeriousBroker.it  This test is no t yet approved or cleared by the [place] FDA and  has been authorized for detection and/or diagnosis of SARS-CoV-2 by FDA under an Emergency Use Authorization (EUA). This EUA will remain  in effect (meaning this test can be used) for the duration of the COVID-19 declaration under Section 564(b)(1) of the Act, 21 U.S.C.section 360bbb-3(b)(1), unless the authorization is terminated  or revoked sooner.       Influenza A by PCR NEGATIVE NEGATIVE Final   Influenza B by PCR NEGATIVE NEGATIVE Final    Comment: (NOTE) The Xpert Xpress SARS-CoV-2/FLU/RSV plus assay is intended as an aid in the diagnosis of influenza from Nasopharyngeal swab specimens and should not be used as a  sole basis for treatment. Nasal washings and aspirates are unacceptable for Xpert Xpress SARS-CoV-2/FLU/RSV testing.  Fact Sheet for Patients: BloggerCourse.com  Fact Sheet for Healthcare Providers: SeriousBroker.it  This test is not yet approved or cleared by the [place] FDA and has been authorized for detection and/or diagnosis of SARS-CoV-2 by FDA under an Emergency Use Authorization (EUA). This EUA will remain in effect (meaning this test can be used) for the duration of the COVID-19 declaration under Section 564(b)(1) of the Act, 21 U.S.C. section 360bbb-3(b)(1), unless the authorization is terminated or revoked.  Performed at [facility], [address] [place], [place] [address]   Blood culture (routine x 2)     Status: None   Collection Time: 05/18/21  1:00 AM   Specimen: BLOOD  Result Value Ref Range Status   Specimen Description   Final    BLOOD SITE NOT SPECIFIED Performed at [facility], [address], [place], [place] [address]    Special Requests   Final    BOTTLES DRAWN AEROBIC AND ANAEROBIC Blood Culture adequate volume Performed at [facility], [address] [place], [place] [address]    Culture   Final    NO GROWTH 5 DAYS Performed at [facility], [address] [place], [place] [address]    Report Status 05/23/2021 FINAL  Final  Urine culture     Status: Abnormal   Collection Time: 05/18/21  5:41 AM   Specimen: Urine, Random  Result Value Ref Range Status   Specimen Description   Final    URINE, RANDOM Performed at [facility], [address] [place], [place] [address]    Special Requests   Final    NONE Performed at [facility], [address] [place], [place] [address]    Culture (A)  Final    <10,000 COLONIES/mL INSIGNIFICANT GROWTH Performed at [facility], [address], [place], [place] [address]    Report Status 05/19/2021 FINAL  Final      Radiology Studies: DG Chest Port 1 View  Result Date: 05/24/2021 CLINICAL DATA:  CHF. EXAM: PORTABLE CHEST 1 VIEW COMPARISON:  05/19/2021. FINDINGS: Chronic coarsened interstitial markings with improved superimposed patchy bilateral airspace opacities. Chronic interstitial lung disease was better characterized on prior CT chest from Mar 14, 2021. No visible pleural effusions or pneumothorax on this single semi erect AP radiograph. Cardiac silhouette is largely obscured. Aortic atherosclerosis. Left subclavian approach dual lead cardiac rhythm maintenance device in similar position. Polyarticular degenerative change. IMPRESSION: Findings suggestive of improving  multifocal pneumonia and/or edema superimposed on chronic interstitial lung disease. Electronically Signed   By: Feliberto Harts MD   On: 05/24/2021 07:48     LOS: 6 days   Lanae Boast, MD [facility]  05/24/2021, 1:12 PM

## 2021-05-24 NOTE — Progress Notes (Signed)
Physical Therapy:  Near syncope episode when assisted OOB to [facility]. Max complaints of weakness, dizziness, BP taken seated 79/49. RA O2 79% Reapplied 4L to achieve 92% and quickly assisted back to bed, supine BP 129/66. Pt states "feels better" but very weak. Called RN to room.

## 2021-05-24 NOTE — Plan of Care (Signed)
  Problem: Education: Goal: Knowledge of General Education information will improve Description: Including pain rating scale, medication(s)/side effects and non-pharmacologic comfort measures Outcome: Progressing   Problem: Activity: Goal: Ability to tolerate increased activity will improve Outcome: Progressing   Problem: Respiratory: Goal: Ability to maintain adequate ventilation will improve Outcome: Progressing

## 2021-05-24 NOTE — Progress Notes (Signed)
Physical Therapy Treatment Patient Details Name: Justin Richmond MRN: [id number] DOB: [birth year] Today's Date: 05/24/2021    History of Present Illness 87 yo male admitted with Pna, sepsis. Hx of HB, PPM, CHF, OA, recurrent UTI, chronic pain, scoliosis, L knee fusion/leg length discrepancy, spinal stenosis, ILD    PT Comments    Near syncope episode when assisted OOB to [facility]. Max complaints of weakness, dizziness, BP taken seated 79/49. RA O2 79% Reapplied 4L to achieve 92% and quickly assisted back to bed, supine BP 129/66. Pt states "feels better" but very weak. Called RN to room via nurse sec but RN unavailable.   Pt not progressing.  Consulted LPT pt may need SNF.   Follow Up Recommendations  SNF     Equipment Recommendations  None recommended by PT    Recommendations for Other Services       Precautions / Restrictions Precautions Precautions: Fall Precaution Comments: monitor O2    Mobility  Bed Mobility Overal bed mobility: Needs Assistance Bed Mobility: Supine to Sit;Sit to Supine     Supine to sit: Mod assist Sit to supine: Total assist;+2 for physical assistance   General bed mobility comments: pt required increased assist and time to transition to EOB this session.  Mild c/o feeling "fuzzy".  Assisted back to bed + 2 assist due to hypotension.    Transfers Overall transfer level: Needs assistance Equipment used: None Transfers: Stand Pivot Transfers   Stand pivot transfers: Mod assist;Max assist       General transfer comment: applied personal shoes (one has a lift insert) and assisted from bed to [facility] per pt request to have a BM.  Did not use any AD, just hand transfer assist VC's on proper push off.  Pt was able to complete 1/4 turn to [facility] with Mod Assist.  While seated on BSC RA decreased as well as BP.  Assisted back to bed and called RN to room via call light system (Nurse sec).  Ambulation/Gait             General Gait Details: unable to  ambulate due to near syncope episode/hypontensive   Stairs             Wheelchair Mobility    Modified Rankin (Stroke Patients Only)       Balance                                            Cognition Arousal/Alertness: Awake/alert Behavior During Therapy: WFL for tasks assessed/performed Overall Cognitive Status: Within Functional Limits for tasks assessed                                 General Comments: AxO x 3 retired Nurse, [employer] , very pleasant but feeling "really weak"      Exercises      General Comments        Pertinent Vitals/Pain Pain Assessment: No/denies pain    Home Living                      Prior Function            PT Goals (current goals can now be found in the care plan section) Progress towards PT goals: Progressing toward goals    Frequency    Min 3X/week  PT Plan Discharge plan needs to be updated (pt not progressing)    Co-evaluation              AM-PAC PT "6 Clicks" Mobility   Outcome Measure  Help needed turning from your back to your side while in a flat bed without using bedrails?: A Lot Help needed moving from lying on your back to sitting on the side of a flat bed without using bedrails?: A Lot Help needed moving to and from a bed to a chair (including a wheelchair)?: A Lot Help needed standing up from a chair using your arms (e.g., wheelchair or bedside chair)?: A Lot Help needed to walk in hospital room?: A Lot Help needed climbing 3-5 steps with a railing? : Total 6 Click Score: 11    End of Session Equipment Utilized During Treatment: Gait belt;Oxygen Activity Tolerance: Patient limited by fatigue;Other (comment) Patient left: in bed;with call bell/phone within reach;with bed alarm set Nurse Communication: Mobility status PT Visit Diagnosis: Muscle weakness (generalized) (M62.81);Difficulty in walking, not elsewhere classified (R26.2)     Time:  0935-1010 PT Time Calculation (min) (ACUTE ONLY): 35 min  Charges:  $Therapeutic Activity: 23-37 mins                     {Justin Richmond  PTA Acute  Rehabilitation Services Pager      [phone] Office      [phone]

## 2021-05-24 NOTE — TOC Progression Note (Signed)
Transition of Care [facility]) - Progression Note    Patient Details  Name: Justin Richmond MRN: [id number] Date of Birth: November 16, 1923  Transition of Care [facility]) CM/SW Contact  Darryll Raju, Olegario Messier, RN Phone Number: 05/24/2021, 2:04 PM  Clinical Narrative: spoke to patient about PT recc sNF-patient declines SNF currently but he will discuss w/dtr [name]. [facility] already following for HHPT, will add HHPT/csw if d/c plan is home.      Expected Discharge Plan: Skilled Nursing Facility Barriers to Discharge: Continued Medical Work up  Expected Discharge Plan and Services Expected Discharge Plan: Skilled Nursing Facility   Discharge Planning Services: CM Consult Post Acute Care Choice: Home Health Living arrangements for the past 2 months: Single Family Home                           HH Arranged: PT HH Agency: [facility] Date [facility] Agency Contacted: 05/22/21 Time HH Agency Contacted: 1348 Representative spoke with at [facility] Agency: Misty Stanley   Social Determinants of Health (SDOH) Interventions    Readmission Risk Interventions No flowsheet data found.

## 2021-05-25 DIAGNOSIS — A419 Sepsis, unspecified organism: Secondary | ICD-10-CM | POA: Diagnosis not present

## 2021-05-25 DIAGNOSIS — R6520 Severe sepsis without septic shock: Secondary | ICD-10-CM | POA: Diagnosis not present

## 2021-05-25 LAB — CBC
HCT: 37.2 % — ABNORMAL LOW (ref 39.0–52.0)
Hemoglobin: 11.6 g/dL — ABNORMAL LOW (ref 13.0–17.0)
MCH: 30.2 pg (ref 26.0–34.0)
MCHC: 31.2 g/dL (ref 30.0–36.0)
MCV: 96.9 fL (ref 80.0–100.0)
Platelets: 313 10*3/uL (ref 150–400)
RBC: 3.84 MIL/uL — ABNORMAL LOW (ref 4.22–5.81)
RDW: 13.9 % (ref 11.5–15.5)
WBC: 12.9 10*3/uL — ABNORMAL HIGH (ref 4.0–10.5)
nRBC: 0 % (ref 0.0–0.2)

## 2021-05-25 LAB — PROCALCITONIN: Procalcitonin: <0.1 ng/mL

## 2021-05-25 MED ORDER — CARVEDILOL 3.125 MG PO TABS
3.1250 mg | ORAL_TABLET | Freq: Two times a day (BID) | ORAL | Status: DC
  Filled 2021-05-25: qty 1

## 2021-05-25 NOTE — Progress Notes (Signed)
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  He was seen in the ED and admitted for severe sepsis due to community-acquired pneumonia acute hypoxic respiratory failure acute on chronic diastolic CHF. Patient treated with iv antibiotics   Also managed with IV Lasix supplemental oxygen. Seen by PT OT initially home health PT was advised.  Subsequently patient was dizzy with walking and weak advised skilled nursing facility .  Subjective: Seen this morning.  Patient was on the bedside commode.  He was orthostatic positive drop to 89 from 118 on standing and after 2 minutes improved to 100.  He was weak and dizzy.   Assessment & Plan:  Orthostatic hypotension: Patient was given fluid multiple times as boluses and now on 50 mill per hour on midodrine increased to 10 mg 3 times daily by cardiology, on TED hose/compression stocking.  Avoid further  IV fluids to minimize fluid overload.Cosyntropin test was normal.  Off his carvedilol.  Given his persistent orthostatic issues appreciate cardiology consultation.  Appreciate cardiology input planning to give 1 L NS bolus over 10 hours  Severe sepsis POA due to pneumonia/Community-acquired pneumonia Clinically stable he has completed antibiotics with ceftriaxone/azithromycin.he has been afebrile, overall stable.  Had leukocytosis but with a steroid, procalcitonin has been negative, off steroid. Recent Labs  Lab 05/25/21 0456  WBC 12.9*  PROCALCITON <0.10   Acute hypoxic aspiratory failure due to #1 currently on neurology nasal cannula.   Continue to wean off oxygen. Likely to go home on oxygen given his ILD and CHF.   Acute on chronic diastolic WJX:BJYNWGN IV Lasix in the ED and x2 as inpatient.  Now he has been needing IV fluid for his orthostatic vitals.  He is off Lasix of Coreg.  Discontinued IV fluids today. Filed Weights   05/29/21 0500 05/30/21 0417 05/31/21 0431  Weight: 60.1 kg 57.2 kg 58.3 kg   Hypertension: 2/2 orthostatic episodes Coreg has been discontinued. On last visit W/ CARDIOLOGY he was upped to 12.5 mg and since then BP has been fluctuating.     Constipation: Continue as needed laxatives  Generalized weakness: PT OT, continue to address orthostatic hypotension.  SNF planned  FAO:ZHYQMVHQ his inhalers,IS,supplemental oxygen.Patient has crackles posteriorly.  Hyperlipidemia: cont  statin  Complete heart block with pacemaker in place.stable.  Hemorrhoid: Cont Anusol  Diet Order             Diet Heart Room service appropriate? Yes; Fluid consistency: Thin; Fluid restriction: 1500 mL Fluid  Diet effective now                   Nutrition Problem: Increased nutrient needs Etiology: acute illness Signs/Symptoms: estimated needs Interventions: Ensure Enlive (each supplement provides 350kcal and 20 grams of protein) Patient's Body mass index is 19.54 kg/m.  Pressure Injury 05/18/21 Coccyx Mid Stage 1 -  Intact skin with non-blanchable redness of a localized area usually over a bony prominence. (Active)  05/18/21 1700  Location: Coccyx  Location Orientation: Mid  Staging: Stage 1 -  Intact skin with non-blanchable redness of a localized area usually over  a bony prominence.  Wound Description (Comments):   Present on Admission: Yes     Pressure Injury 05/18/21 Buttocks Left Stage 2 -  Partial thickness loss of dermis presenting as a shallow open injury with a red, pink wound bed without slough. (Active)  05/18/21 1700  Location: Buttocks  Location Orientation: Left  Staging: Stage 2 -   Partial thickness loss of dermis presenting as a shallow open injury with a red, pink wound bed without slough.  Wound Description (Comments):   Present on Admission: Yes   DVT prophylaxis: enoxaparin (LOVENOX) injection 40 mg Start: 05/18/21 1000 Code Status:   Code Status: DNR  Family Communication: plan of care discussed with patient at bedside and I had updated daughter.  Status is: Inpatient Remains inpatient appropriate because:Inpatient level of care appropriate due to severity of illness Dispo: The patient is from: Home              Anticipated d/c is to: SNF once orthostatic vitals improves and bed available. Hopefully 1-2 days              Patient currently NOT medically stable for discharge.   Difficult to place patient No  Unresulted Labs (From admission, onward)    None      Medications reviewed:  Scheduled Meds:  arformoterol  15 mcg Nebulization BID   And   umeclidinium bromide  1 puff Inhalation Daily   And   budesonide (PULMICORT) nebulizer solution  1 mg Nebulization BID   aspirin EC  81 mg Oral Daily   docusate sodium  100 mg Oral BID   enoxaparin (LOVENOX) injection  40 mg Subcutaneous Q24H   feeding supplement  237 mL Oral BID BM   gabapentin  100 mg Oral QHS   hydrocortisone  25 mg Rectal BID   magnesium gluconate  500 mg Oral QHS   midodrine  10 mg Oral TID WC   oxybutynin  5 mg Oral BID   pantoprazole  20 mg Oral Daily   polyethylene glycol  17 g Oral Daily   rOPINIRole  0.25 mg Oral QHS   rosuvastatin  10 mg Oral QHS   vitamin B-12  5,000 mcg Oral Daily   Continuous Infusions:     Consultants:see note  Procedures:see note Antimicrobials: Anti-infectives (From admission, onward)    Start     Dose/Rate Route Frequency Ordered Stop   05/19/21 0100  cefTRIAXone (ROCEPHIN) 1 g in sodium chloride 0.9 % 100 mL IVPB        1 g 200 mL/hr over 30 Minutes Intravenous Every 24 hours 05/18/21 0341 05/24/21 0057   05/18/21 0400  azithromycin  (ZITHROMAX) 500 mg in sodium chloride 0.9 % 250 mL IVPB  Status:  Discontinued        500 mg 250 mL/hr over 60 Minutes Intravenous Every 24 hours 05/18/21 0341 05/23/21 0716   05/17/21 2345  cefTRIAXone (ROCEPHIN) 1 g in sodium chloride 0.9 % 100 mL IVPB        1 g 200 mL/hr over 30 Minutes Intravenous  Once 05/17/21 2333 05/18/21 0116      Culture/Microbiology    Component Value Date/Time   SDES  05/18/2021 0541    URINE, RANDOM Performed at [facility], [address], [place], [place] [address]    SPECREQUEST  05/18/2021 0541    NONE Performed at [facility], [address] [place], [place] [address]    CULT (A) 05/18/2021 0541    <  10,000 COLONIES/mL INSIGNIFICANT GROWTH Performed at [facility], [address] [place], [place] [address]    REPTSTATUS 05/19/2021 FINAL 05/18/2021 0541    Other culture-see note  Objective: Vitals: Today's Vitals   05/30/21 2124 05/31/21 0037 05/31/21 0431 05/31/21 0821  BP:   (!) 155/95   Pulse:   85   Resp:   14   Temp:   98.1 F (36.7 C)   TempSrc:   Oral   SpO2:   96% 97%  Weight:   58.3 kg   Height:      PainSc: 0-No pain 2       Intake/Output Summary (Last 24 hours) at 05/31/2021 1132 Last data filed at 05/31/2021 0847 Gross per 24 hour  Intake 1077 ml  Output 1490 ml  Net -413 ml   Filed Weights   05/29/21 0500 05/30/21 0417 05/31/21 0431  Weight: 60.1 kg 57.2 kg 58.3 kg   Weight change: 1.1 kg  Intake/Output from previous day: 07/21 0701 - 07/22 0700 In: 1317 [P.O.:1317] Out: 1390 [Urine:1390] Intake/Output this shift: Total I/O In: -  Out: 600 [Urine:600] Filed Weights   05/29/21 0500 05/30/21 0417 05/31/21 0431  Weight: 60.1 kg 57.2 kg 58.3 kg   Examination: General exam: AAOx 3, weak, elderly.  HEENT:Oral mucosa moist, Ear/Nose WNL grossly, dentition normal. Respiratory system: bilaterally basal crackles appears to be chronic no use of accessory  muscle Cardiovascular system: S1 & S2 +, No JVD,. Gastrointestinal system: Abdomen soft, NT,ND, BS+ Nervous System:Alert, awake, moving extremities and grossly nonfocal Extremities: no edema, distal peripheral pulses palpable.  Skin: No rashes,no icterus. MSK: Normal muscle bulk,tone, power    Data Reviewed: I have personally reviewed following labs and imaging studies CBC: Recent Labs  Lab 05/25/21 0456  WBC 12.9*  HGB 11.6*  HCT 37.2*  MCV 96.9  PLT 313  Basic Metabolic Panel: Recent Labs  Lab 05/27/21 1122  NA 138  K 3.7  CL 95*  CO2 33*  GLUCOSE 161*  BUN 35*  CREATININE 0.88  CALCIUM 8.8*  GFR: Estimated Creatinine Clearance: 42.3 mL/min (by C-G formula based on SCr of 0.88 mg/dL). Liver Function Tests: No results for input(s): AST, ALT, ALKPHOS, BILITOT, PROT, ALBUMIN in the last 168 hours.  No results for input(s): LIPASE, AMYLASE in the last 168 hours. No results for input(s): AMMONIA in the last 168 hours. Coagulation Profile: No results for input(s): INR, PROTIME in the last 168 hours.  Cardiac Enzymes: No results for input(s): CKTOTAL, CKMB, CKMBINDEX, TROPONINI in the last 168 hours. BNP (last 3 results) No results for input(s): PROBNP in the last 8760 hours. HbA1C: No results for input(s): HGBA1C in the last 72 hours. CBG: No results for input(s): GLUCAP in the last 168 hours. Lipid Profile: No results for input(s): CHOL, HDL, LDLCALC, TRIG, CHOLHDL, LDLDIRECT in the last 72 hours. Thyroid Function Tests: No results for input(s): TSH, T4TOTAL, FREET4, T3FREE, THYROIDAB in the last 72 hours. Anemia Panel: No results for input(s): VITAMINB12, FOLATE, FERRITIN, TIBC, IRON, RETICCTPCT in the last 72 hours. Sepsis Labs: Recent Labs  Lab 05/25/21 0456  PROCALCITON <0.10   Recent Results (from the past 240 hour(s))  SARS CORONAVIRUS 2 (TAT 6-24 HRS) Nasopharyngeal Nasopharyngeal Swab     Status: None   Collection Time: 05/29/21  5:25 PM   Specimen:  Nasopharyngeal Swab  Result Value Ref Range Status   SARS Coronavirus 2 NEGATIVE NEGATIVE Final    Comment: (NOTE) SARS-CoV-2 target nucleic acids are NOT  DETECTED.  The SARS-CoV-2 RNA is generally detectable in upper and lower respiratory specimens during the acute phase of infection. Negative results do not preclude SARS-CoV-2 infection, do not rule out co-infections with other pathogens, and should not be used as the sole basis for treatment or other patient management decisions. Negative results must be combined with clinical observations, patient history, and epidemiological information. The expected result is Negative.  Fact Sheet for Patients: HairSlick.no  Fact Sheet for Healthcare Providers: quierodirigir.com  This test is not yet approved or cleared by the [place] FDA and  has been authorized for detection and/or diagnosis of SARS-CoV-2 by FDA under an Emergency Use Authorization (EUA). This EUA will remain  in effect (meaning this test can be used) for the duration of the COVID-19 declaration under Se ction 564(b)(1) of the Act, 21 U.S.C. section 360bbb-3(b)(1), unless the authorization is terminated or revoked sooner.  Performed at [facility], [address], [place], [place] [address]      Radiology Studies: No results found.   LOS: 13 days   Lanae Boast, MD [facility]  05/31/2021, 11:32 AM

## 2021-05-25 NOTE — Progress Notes (Signed)
Pt with positive orthostatics upon standing. Pt stated that he felt dizzy/lightheaded after standing and asked to sit back down. MD made aware. Scheduled PO coreg held per MD.

## 2021-05-25 NOTE — [place] FL2 (Signed)
[place] MEDICAID FL2 LEVEL OF CARE SCREENING TOOL     IDENTIFICATION  Patient Name: Justin Richmond Birthdate: 12/11/1923 Sex: male Admission Date (Current Location): 05/17/2021  [facility] and [place] Number:  Producer, television/film/video and Address:  [facility],  [address], [place] [address]      Provider Number: [id number]  Attending Physician Name and Address:  Lanae Boast, MD  Relative Name and Phone Number:  Norva Pavlov- Daughter     [phone]    Current Level of Care: Hospital Recommended Level of Care: Skilled Nursing Facility Prior Approval Number:    Date Approved/Denied:   PASRR Number: [id number]  Discharge Plan: SNF    Current Diagnoses: Patient Active Problem List   Diagnosis Date Noted   Pressure injury of skin 05/19/2021   Pressure injury of buttock, stage 2 (HCC) 05/19/2021   Hemorrhoid 05/19/2021   CAP (community acquired pneumonia) 05/18/2021   Acute hypoxemic respiratory failure (HCC) 05/18/2021   Severe sepsis (HCC) 05/18/2021   CHF exacerbation (HCC) 05/18/2021   Unstageable pressure ulcer of sacral region (HCC) 05/18/2021   Essential hypertension 03/11/2021   Chronic diastolic CHF (congestive heart failure) (HCC) 03/11/2021   GERD (gastroesophageal reflux disease) 03/11/2021   RLS (restless legs syndrome) 03/11/2021   ILD (interstitial lung disease) (HCC) 03/11/2021   History of complete heart block 03/11/2021   UTI (urinary tract infection) 03/10/2021   Peripheral polyneuropathy 04/02/2020   Spinal stenosis of lumbar region with neurogenic claudication 04/02/2020    Orientation RESPIRATION BLADDER Height & Weight     Self, Time, Situation, Place  O2 (3-4 L/min) Continent Weight: 131 lb 2.8 oz (59.5 kg) Height:  5\' 8"  (172.7 cm)  BEHAVIORAL SYMPTOMS/MOOD NEUROLOGICAL BOWEL NUTRITION STATUS      Continent Diet (cardia)  AMBULATORY STATUS COMMUNICATION OF NEEDS Skin   Extensive Assist Verbally Skin abrasions, Other  (Comment) (Abrasion;back(right,left ) Ecchymosis; back (right,left))                       Personal Care Assistance Level of Assistance  Bathing, Feeding, Dressing Bathing Assistance: Limited assistance Feeding assistance: Independent Dressing Assistance: Limited assistance     Functional Limitations Info  Sight, Hearing, Speech Sight Info: Adequate Hearing Info: Adequate Speech Info: Adequate    SPECIAL CARE FACTORS FREQUENCY  PT (By licensed PT), OT (By licensed OT)     PT Frequency: 5 x a week OT Frequency: 5 x a week            Contractures Contractures Info: Not present    Additional Factors Info  Code Status, Allergies Code Status Info: DNR Allergies Info: Penicillins           Current Medications (05/25/2021):  This is the current hospital active medication list Current Facility-Administered Medications  Medication Dose Route Frequency Provider Last Rate Last Admin   acetaminophen (TYLENOL) tablet 650 mg  650 mg Oral Q6H PRN 05/27/2021, MD   650 mg at 05/21/21 0225   Or   acetaminophen (TYLENOL) suppository 650 mg  650 mg Rectal Q6H PRN 07/22/21, MD       albuterol (PROVENTIL) (2.5 MG/3ML) 0.083% nebulizer solution 2.5 mg  2.5 mg Nebulization Q4H PRN John Giovanni, MD       arformoterol (BROVANA) nebulizer solution 15 mcg  15 mcg Nebulization BID John Giovanni, MD   15 mcg at 05/25/21 0750   And   umeclidinium bromide (INCRUSE ELLIPTA) 62.5 MCG/INH 1 puff  1 puff  Inhalation Daily Lurene Shadow, MD   1 puff at 05/25/21 0802   And   budesonide (PULMICORT) nebulizer solution 1 mg  1 mg Nebulization BID Lurene Shadow, MD   1 mg at 05/25/21 0755   aspirin EC tablet 81 mg  81 mg Oral Daily Lurene Shadow, MD   81 mg at 05/25/21 2094   docusate sodium (COLACE) capsule 100 mg  100 mg Oral BID Myrtie Neither, MD   100 mg at 05/25/21 0927   enoxaparin (LOVENOX) injection 40 mg  40 mg Subcutaneous Q24H John Giovanni, MD   40 mg at  05/25/21 7096   feeding supplement (ENSURE ENLIVE / ENSURE PLUS) liquid 237 mL  237 mL Oral BID BM John Giovanni, MD   237 mL at 05/25/21 0929   gabapentin (NEURONTIN) capsule 100 mg  100 mg Oral QHS Lurene Shadow, MD   100 mg at 05/24/21 2132   hydrocortisone (ANUSOL-HC) suppository 25 mg  25 mg Rectal BID Myrtie Neither, MD   25 mg at 05/25/21 0929   magnesium gluconate (MAGONATE) tablet 500 mg  500 mg Oral QHS Myrtie Neither, MD   500 mg at 05/24/21 2132   oxybutynin (DITROPAN) tablet 5 mg  5 mg Oral BID Lurene Shadow, MD   5 mg at 05/25/21 0928   pantoprazole (PROTONIX) EC tablet 20 mg  20 mg Oral Daily Myrtie Neither, MD   20 mg at 05/25/21 0928   polyethylene glycol (MIRALAX / GLYCOLAX) packet 17 g  17 g Oral Daily Myrtie Neither, MD   17 g at 05/25/21 2836   predniSONE (DELTASONE) tablet 20 mg  20 mg Oral Q breakfast Kc, Dayna Barker, MD   20 mg at 05/25/21 6294   rOPINIRole (REQUIP) tablet 0.25 mg  0.25 mg Oral QHS Myrtie Neither, MD   0.25 mg at 05/24/21 2132   rosuvastatin (CRESTOR) tablet 10 mg  10 mg Oral QHS Myrtie Neither, MD   10 mg at 05/24/21 2132   vitamin B-12 (CYANOCOBALAMIN) tablet 5,000 mcg  5,000 mcg Oral Daily Lurene Shadow, MD   5,000 mcg at 05/25/21 7654   witch hazel-glycerin (TUCKS) pad   Topical PRN Myrtie Neither, MD   Given at 05/21/21 [phone]     Discharge Medications: Please see discharge summary for a list of discharge medications.  Relevant Imaging Results:  Relevant Lab Results:   Additional Information SSN [id number]  Valentina Shaggy Josey Dettmann, LCSW

## 2021-05-25 NOTE — TOC Transition Note (Signed)
Transition of Care [facility]) - CM/SW Discharge Note   Patient Details  Name: Justin Richmond MRN: [id number] Date of Birth: 1924-03-13  [facility] CM/SW Contact:  Larrie Kass, LCSW Phone Number: 05/25/2021, 1:22 PM   Clinical Narrative:    CSW confirmed with pt and family, pt wants to d/c to a SNF for rehab. CSW completed FL2 and sent out referrals.        Final next level of care: Skilled Nursing Facility (patient currently declines SNF wants home) Barriers to Discharge: SNF Pending bed offer   Patient Goals and CMS Choice Patient states their goals for this hospitalization and ongoing recovery are:: go home CMS Medicare.gov Compare Post Acute Care list provided to:: Patient Represenative (must comment) Choice offered to / list presented to : Patient, Adult Children  Discharge Placement                       Discharge Plan and Services   Discharge Planning Services: CM Consult Post Acute Care Choice: Home Health                    HH Arranged: PT [facility] Agency: [facility] Date [facility] Agency Contacted: 05/22/21 Time HH Agency Contacted: [phone] Representative spoke with at [facility] Agency: Misty Stanley  Social Determinants of Health (SDOH) Interventions     Readmission Risk Interventions No flowsheet data found.

## 2021-05-25 NOTE — Progress Notes (Signed)
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  He was seen in the ED and admitted for severe sepsis due to community-acquired pneumonia acute hypoxic respiratory failure acute on chronic diastolic CHF. Patient treated with iv antibiotics   Also managed with IV Lasix supplemental oxygen. Seen by PT OT initially home health PT was advised.  Subsequently patient was dizzy with walking and weak advised skilled nursing facility this  Subjective: Seen this morning.  Resting comfortably. Upset about not getting his pineapple.  Assessment & Plan:  Severe sepsis POA due to pneumonia Community-acquired pneumonia Completed azithromycin.  Completed ceftriaxone procalcitonin has been negative yesterday leukocytosis improving.  WBC count up likely in the setting of steroid also.  Continue p.o. prednisone, cont PT OT Recent Labs  Lab 05/21/21 0424 05/22/21 0441 05/23/21 1156 05/23/21 1313 05/24/21 0419 05/25/21 0456  WBC 13.8* 13.6* 19.1*  --  13.4* 12.9*  PROCALCITON  --   --   --  0.11 <0.10 <0.10   Acute hypoxic aspiratory failure due to #1 was on 4 L of cannula now down to 2 L continue same and wean as tolerated.  If unable to tolerate will need to discharge on Tuesday nasal cannula given he has ILD and CHF  Acute on chronic diastolic CHF needing IV Lasix times once in the ED repeat dose was given next day again repeated 7/14.  Patient was hypotensive orthostatic hold off on further Lasix.He does have crackles in the lung but has ILD. Filed Weights   05/23/21 0600 05/24/21 0500  05/25/21 0442  Weight: 59.3 kg 59 kg 59.5 kg    Intake/Output Summary (Last 24 hours) at 05/25/2021 1057 Last data filed at 05/25/2021 0900 Gross per 24 hour  Intake 480 ml  Output 1225 ml  Net -745 ml   Orthostatic hypotension: Coreg was held 7/15-repeat orthostatic  and if okay resume Coreg at lower dose. Hypertension: Holding Coreg due to dizziness orthostasis.  BP appears stable.   Constipation: improved  Generalized weakness: PT OT, PT OT has advised SNF awaiting for placement.  OZH:YQMVHQIOLD:continue his inhalers,IS,supplemental oxygen.Patient has crackles posteriorly.  Hyperlipidemia: Continue statin  Complete heart block with pacemaker in place  Hemorrhoid: cont his Anusol  Diet Order             Diet Heart Room service appropriate? Yes; Fluid consistency: Thin; Fluid restriction: 1200 mL Fluid  Diet effective now                   Nutrition Problem: Increased nutrient needs Etiology: acute illness Signs/Symptoms: estimated needs Interventions: Ensure Enlive (each supplement provides 350kcal and 20 grams of protein) Patient's Body mass index is 19.94 kg/m.  Pressure Injury 05/18/21 Coccyx Mid Stage 1 -  Intact skin with non-blanchable redness of a localized area usually over a bony prominence. (Active)  05/18/21 1700  Location: Coccyx  Location Orientation: Mid  Staging: Stage 1 -  Intact skin with non-blanchable redness of a localized area usually over a bony prominence.  Wound Description (Comments):   Present on Admission: Yes     Pressure Injury  05/18/21 Buttocks Left Stage 2 -  Partial thickness loss of dermis presenting as a shallow open injury with a red, pink wound bed without slough. (Active)  05/18/21 1700  Location: Buttocks  Location Orientation: Left  Staging: Stage 2 -  Partial thickness loss of dermis presenting as a shallow open injury with a red, pink wound bed without slough.  Wound Description (Comments):   Present on Admission: Yes   DVT  prophylaxis: enoxaparin (LOVENOX) injection 40 mg Start: 05/18/21 1000 Code Status:   Code Status: DNR  Family Communication: plan of care discussed with patient at bedside and updated daughter. Status is: Inpatient Remains inpatient appropriate because:Inpatient level of care appropriate due to severity of illness Dispo: The patient is from: Home              Anticipated d/c is to: SNF once available              Patient currently medically stable for discharge.   Difficult to place patient No  Unresulted Labs (From admission, onward)     Start     Ordered   05/24/21 0500  CBC  Daily,   R     Question:  Specimen collection method  Answer:  Lab=Lab collect   05/23/21 1133           Medications reviewed:  Scheduled Meds:  arformoterol  15 mcg Nebulization BID   And   umeclidinium bromide  1 puff Inhalation Daily   And   budesonide (PULMICORT) nebulizer solution  1 mg Nebulization BID   aspirin EC  81 mg Oral Daily   docusate sodium  100 mg Oral BID   enoxaparin (LOVENOX) injection  40 mg Subcutaneous Q24H   feeding supplement  237 mL Oral BID BM   gabapentin  100 mg Oral QHS   hydrocortisone  25 mg Rectal BID   magnesium gluconate  500 mg Oral QHS   oxybutynin  5 mg Oral BID   pantoprazole  20 mg Oral Daily   polyethylene glycol  17 g Oral Daily   predniSONE  20 mg Oral Q breakfast   rOPINIRole  0.25 mg Oral QHS   rosuvastatin  10 mg Oral QHS   vitamin B-12  5,000 mcg Oral Daily   Continuous Infusions:   Consultants:see note  Procedures:see note Antimicrobials: Anti-infectives (From admission, onward)    Start     Dose/Rate Route Frequency Ordered Stop   05/19/21 0100  cefTRIAXone (ROCEPHIN) 1 g in sodium chloride 0.9 % 100 mL IVPB        1 g 200 mL/hr over 30 Minutes Intravenous Every 24 hours 05/18/21 0341 05/24/21 0057   05/18/21 0400  azithromycin (ZITHROMAX) 500 mg in sodium chloride 0.9 % 250 mL IVPB  Status:  Discontinued        500 mg 250 mL/hr over 60  Minutes Intravenous Every 24 hours 05/18/21 0341 05/23/21 0716   05/17/21 2345  cefTRIAXone (ROCEPHIN) 1 g in sodium chloride 0.9 % 100 mL IVPB        1 g 200 mL/hr over 30 Minutes Intravenous  Once 05/17/21 2333 05/18/21 0116      Culture/Microbiology    Component Value Date/Time   SDES  05/18/2021 0541    URINE, RANDOM Performed at [facility], [address] [place], [place] [address]    SPECREQUEST  05/18/2021 0541    NONE Performed at [facility], [address] [place], [place] [address]  CULT (A) 05/18/2021 0541    <10,000 COLONIES/mL INSIGNIFICANT GROWTH Performed at [facility], [address], [place], [place] [address]    REPTSTATUS 05/19/2021 FINAL 05/18/2021 0541    Other culture-see note  Objective: Vitals: Today's Vitals   05/25/21 0441 05/25/21 0442 05/25/21 0750 05/25/21 1007  BP: 122/66     Pulse: 81     Resp: 19     Temp: 97.8 F (36.6 C)     TempSrc: Oral     SpO2: 98%  99%   Weight:  59.5 kg    Height:      PainSc:    3     Intake/Output Summary (Last 24 hours) at 05/25/2021 1057 Last data filed at 05/25/2021 0900 Gross per 24 hour  Intake 480 ml  Output 1225 ml  Net -745 ml    Filed Weights   05/23/21 0600 05/24/21 0500 05/25/21 0442  Weight: 59.3 kg 59 kg 59.5 kg   Weight change: 0.5 kg  Intake/Output from previous day: 07/15 0701 - 07/16 0700 In: 480 [P.O.:480] Out: 900 [Urine:900] Intake/Output this shift: Total I/O In: 240 [P.O.:240] Out: 325 [Urine:325] Filed Weights   05/23/21 0600 05/24/21 0500 05/25/21 0442  Weight: 59.3 kg 59 kg 59.5 kg   Examination: General exam: AAOx3,older than stated age, elderly, appears weak.   HEENT:Oral mucosa moist, Ear/Nose WNL grossly, dentition normal. Respiratory system: bilaterally diminished,no use of accessory muscle Cardiovascular system: S1 & S2 +, No JVD,. Gastrointestinal system: Abdomen soft,NT,ND, BS+ Nervous System:Alert,  awake, moving extremities and grossly nonfocal Extremities:No edema, distal peripheral pulses palpable.  Skin: No rashes,no icterus. MSK: Normal muscle bulk,tone, power    Data Reviewed: I have personally reviewed following labs and imaging studies CBC: Recent Labs  Lab 05/19/21 0452 05/20/21 0407 05/21/21 0424 05/22/21 0441 05/23/21 1156 05/24/21 0419 05/25/21 0456  WBC 10.5   < > 13.8* 13.6* 19.1* 13.4* 12.9*  NEUTROABS 9.0*  --  10.5* 9.9*  --   --   --   HGB 11.6*   < > 10.9* 11.2* 11.7* 11.9* 11.6*  HCT 35.8*   < > 34.1* 35.1* 37.0* 36.7* 37.2*  MCV 92.0   < > 94.2 94.4 94.6 95.6 96.9  PLT 226   < > 223 260 263 286 313   < > = values in this interval not displayed.   Basic Metabolic Panel: Recent Labs  Lab 05/19/21 0452 05/20/21 0407 05/21/21 0424 05/22/21 0441  NA 137 138 139 139  K 3.5 3.8 4.1 3.9  CL 99 100 104 101  CO2 29 29 28 30   GLUCOSE 157* 140* 139* 148*  BUN 31* 47* 48* 52*  CREATININE 0.82 0.76 0.84 0.95  CALCIUM 8.9 9.1 8.9 9.0    GFR: Estimated Creatinine Clearance: 40 mL/min (by C-G formula based on SCr of 0.95 mg/dL). Liver Function Tests: No results for input(s): AST, ALT, ALKPHOS, BILITOT, PROT, ALBUMIN in the last 168 hours.  No results for input(s): LIPASE, AMYLASE in the last 168 hours. No results for input(s): AMMONIA in the last 168 hours. Coagulation Profile: No results for input(s): INR, PROTIME in the last 168 hours.  Cardiac Enzymes: No results for input(s): CKTOTAL, CKMB, CKMBINDEX, TROPONINI in the last 168 hours. BNP (last 3 results) No results for input(s): PROBNP in the last 8760 hours. HbA1C: No results for input(s): HGBA1C in the last 72 hours. CBG: No results for input(s): GLUCAP in the last 168 hours. Lipid Profile: No results for input(s):  CHOL, HDL, LDLCALC, TRIG, CHOLHDL, LDLDIRECT in the last 72 hours. Thyroid Function Tests: No results for input(s): TSH, T4TOTAL, FREET4, T3FREE, THYROIDAB in the last 72  hours. Anemia Panel: No results for input(s): VITAMINB12, FOLATE, FERRITIN, TIBC, IRON, RETICCTPCT in the last 72 hours. Sepsis Labs: Recent Labs  Lab 05/23/21 1313 05/24/21 0419 05/25/21 0456  PROCALCITON 0.11 <0.10 <0.10     Recent Results (from the past 240 hour(s))  Culture, blood (Routine x 2)     Status: None   Collection Time: 05/17/21 10:44 PM   Specimen: BLOOD  Result Value Ref Range Status   Specimen Description   Final    BLOOD LEFT ANTECUBITAL Performed at [facility], [address], [place], [place] [address]    Special Requests   Final    BOTTLES DRAWN AEROBIC AND ANAEROBIC Blood Culture adequate volume Performed at [facility], [address] [place], [place] [address]    Culture   Final    NO GROWTH 5 DAYS Performed at [facility], [address], [place], [place] [address]    Report Status 05/23/2021 FINAL  Final  Resp Panel by RT-PCR (Flu A&B, Covid) Nasopharyngeal Swab     Status: None   Collection Time: 05/17/21 10:51 PM   Specimen: Nasopharyngeal Swab; Nasopharyngeal(NP) swabs in vial transport medium  Result Value Ref Range Status   SARS Coronavirus 2 by RT PCR NEGATIVE NEGATIVE Final    Comment: (NOTE) SARS-CoV-2 target nucleic acids are NOT DETECTED.  The SARS-CoV-2 RNA is generally detectable in upper respiratory specimens during the acute phase of infection. The lowest concentration of SARS-CoV-2 viral copies this assay can detect is 138 copies/mL. A negative result does not preclude SARS-Cov-2 infection and should not be used as the sole basis for treatment or other patient management decisions. A negative result may occur with  improper specimen collection/handling, submission of specimen other than nasopharyngeal swab, presence of viral mutation(s) within the areas targeted by this assay, and inadequate number of viral copies(<138 copies/mL). A negative result must be combined with clinical  observations, patient history, and epidemiological information. The expected result is Negative.  Fact Sheet for Patients:  BloggerCourse.com  Fact Sheet for Healthcare Providers:  SeriousBroker.it  This test is no t yet approved or cleared by the [place] FDA and  has been authorized for detection and/or diagnosis of SARS-CoV-2 by FDA under an Emergency Use Authorization (EUA). This EUA will remain  in effect (meaning this test can be used) for the duration of the COVID-19 declaration under Section 564(b)(1) of the Act, 21 U.S.C.section 360bbb-3(b)(1), unless the authorization is terminated  or revoked sooner.       Influenza A by PCR NEGATIVE NEGATIVE Final   Influenza B by PCR NEGATIVE NEGATIVE Final    Comment: (NOTE) The Xpert Xpress SARS-CoV-2/FLU/RSV plus assay is intended as an aid in the diagnosis of influenza from Nasopharyngeal swab specimens and should not be used as a sole basis for treatment. Nasal washings and aspirates are unacceptable for Xpert Xpress SARS-CoV-2/FLU/RSV testing.  Fact Sheet for Patients: BloggerCourse.com  Fact Sheet for Healthcare Providers: SeriousBroker.it  This test is not yet approved or cleared by the [place] FDA and has been authorized for detection and/or diagnosis of SARS-CoV-2 by FDA under an Emergency Use Authorization (EUA). This EUA will remain in effect (meaning this test can be used) for the duration of the COVID-19 declaration under Section 564(b)(1) of the Act, 21 U.S.C. section  360bbb-3(b)(1), unless the authorization is terminated or revoked.  Performed at [facility], [address], [place], [place] [address]   Blood culture (routine x 2)     Status: None (Preliminary result)   Collection Time: 05/18/21  1:00 AM   Specimen: BLOOD  Result Value Ref Range Status   Specimen Description    Final    BLOOD SITE NOT SPECIFIED Performed at [facility], [address] [place], [place] [address]    Special Requests   Final    BOTTLES DRAWN AEROBIC AND ANAEROBIC Blood Culture adequate volume Performed at [facility], [address], [place], [place] [address]    Culture   Final    GRAM POSITIVE RODS CULTURE REINCUBATED FOR BETTER GROWTH CRITICAL RESULT CALLED TO, READ BACK BY AND VERIFIED WITH: PHARMD N.JOVAC AT 1420 ON 05/24/2021 BY T.SAAD. Performed at [facility], [address], [place], [place] [address]    Report Status PENDING  Incomplete  Urine culture     Status: Abnormal   Collection Time: 05/18/21  5:41 AM   Specimen: Urine, Random  Result Value Ref Range Status   Specimen Description   Final    URINE, RANDOM Performed at [facility], [address] [place], [place] [address]    Special Requests   Final    NONE Performed at [facility], [address], [place], [place] [address]    Culture (A)  Final    <10,000 COLONIES/mL INSIGNIFICANT GROWTH Performed at [facility], [address], [place], [place] [address]    Report Status 05/19/2021 FINAL  Final      Radiology Studies: DG Chest Port 1 View  Result Date: 05/24/2021 CLINICAL DATA:  CHF. EXAM: PORTABLE CHEST 1 VIEW COMPARISON:  05/19/2021. FINDINGS: Chronic coarsened interstitial markings with improved superimposed patchy bilateral airspace opacities. Chronic interstitial lung disease was better characterized on prior CT chest from Mar 14, 2021. No visible pleural effusions or pneumothorax on this single semi erect AP radiograph. Cardiac silhouette is largely obscured. Aortic atherosclerosis. Left subclavian approach dual lead cardiac rhythm maintenance device in similar position. Polyarticular degenerative change. IMPRESSION: Findings suggestive of improving multifocal pneumonia and/or edema superimposed on  chronic interstitial lung disease. Electronically Signed   By: Feliberto Harts MD   On: 05/24/2021 07:48     LOS: 7 days   Lanae Boast, MD [facility]  05/25/2021, 10:57 AM

## 2021-05-26 ENCOUNTER — Encounter (HOSPITAL_COMMUNITY): Payer: Self-pay | Admitting: Internal Medicine

## 2021-05-26 DIAGNOSIS — A419 Sepsis, unspecified organism: Secondary | ICD-10-CM | POA: Diagnosis not present

## 2021-05-26 DIAGNOSIS — R6520 Severe sepsis without septic shock: Secondary | ICD-10-CM | POA: Diagnosis not present

## 2021-05-26 LAB — CULTURE, BLOOD (ROUTINE X 2): Special Requests: ADEQUATE

## 2021-05-26 MED ORDER — SODIUM CHLORIDE 0.9 % IV BOLUS
500.0000 mL | Freq: Once | INTRAVENOUS | Status: AC
  Administered 2021-05-26: 500 mL via INTRAVENOUS

## 2021-05-26 NOTE — Progress Notes (Signed)
Orthostatic vitals attempted but patient was unable to complete d/t dizziness. Lying 134/79 and sitting 100/69. TED hose applied. Will attempt orthostatics at a later time.

## 2021-05-26 NOTE — TOC Progression Note (Signed)
Transition of Care [facility]) - Progression Note    Patient Details  Name: Justin [place] MRN: [id number] Date of Birth: [birth year]  [facility] CM/SW Contact  Akire Rennert, Olegario Messier, RN Phone Number: 05/26/2021, 11:19 AM  Clinical Narrative: Bed offer given to dtr elizabeth-currently only [facility] accepted-dtr informed to let me know if agreeable-also informed her of Active w/[facility]-can maximize services for home if needed.      Expected Discharge Plan: Skilled Nursing Facility Barriers to Discharge: SNF Pending bed offer  Expected Discharge Plan and Services Expected Discharge Plan: Skilled Nursing Facility   Discharge Planning Services: CM Consult Post Acute Care Choice: Home Health Living arrangements for the past 2 months: Single Family Home                           HH Arranged: PT HH Agency: [facility] Date [facility] Agency Contacted: 05/22/21 Time HH Agency Contacted: 1348 Representative spoke with at [facility]: Misty Stanley   Social Determinants of Health (SDOH) Interventions    Readmission Risk Interventions No flowsheet data found.

## 2021-05-26 NOTE — Progress Notes (Signed)
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  He was seen in the ED and admitted for severe sepsis due to community-acquired pneumonia acute hypoxic respiratory failure acute on chronic diastolic CHF. Patient treated with iv antibiotics   Also managed with IV Lasix supplemental oxygen. Seen by PT OT initially home health PT was advised.  Subsequently patient was dizzy with walking and weak advised skilled nursing facility .  Subjective: Seen this morning no new complaints.  O2 down to 1 L . Mild dizziness standing yesterday orthostatics borderline positive, Coreg has been stopped Blood pressure stable in 130-140s  Assessment & Plan:  Severe sepsis POA due to pneumonia Community-acquired pneumonia Completed ceftriaxone/azithromycin.  Remains afebrile has had mild leukocytosis but with a steroid, procalcitonin has been negative, weaning of steroid Recent Labs  Lab 05/21/21 0424 05/22/21 0441 05/23/21 1156 05/23/21 1313 05/24/21 0419 05/25/21 0456  WBC 13.8* 13.6* 19.1*  --  13.4* 12.9*  PROCALCITON  --   --   --  0.11 <0.10 <0.10   Acute hypoxic aspiratory failure due to #1 remains on 2 L will likely need to go to home with 2 L  given he has ILD and CHF.  Acute on chronic diastolic LFY:BOFBPZW IV Lasix times once in the ED repeat dose was given next day again repeated 7/14. Patient was hypotensive orthostatic- so held off on further Lasix.He does have crackles in the lung but has ILD. Overall euvolemic Filed Weights   05/24/21 0500 05/25/21 0442 05/26/21 0631   Weight: 59 kg 59.5 kg 60 kg    Intake/Output Summary (Last 24 hours) at 05/26/2021 0736 Last data filed at 05/26/2021 0631 Gross per 24 hour  Intake 1410 ml  Output 2025 ml  Net -615 ml   Orthostatic hypotension: mildly positive. BP stable in 130-140- will cont to hold coreg.  If Patient still orthostatic today-will add LE stocking  Hypertension: stable, Holding Coreg due to dizziness    Constipation: improved  Generalized weakness: cont PT OT- planing snf  CHE:NIDPOEUM his inhalers,IS,supplemental oxygen.Patient has crackles posteriorly.  Hyperlipidemia: Continue statin  Complete heart block with pacemaker in place  Hemorrhoid: cont his Anusol  Diet Order             Diet Heart Room service appropriate? Yes; Fluid consistency: Thin; Fluid restriction: 1200 mL Fluid  Diet effective now                   Nutrition Problem: Increased nutrient needs Etiology: acute illness Signs/Symptoms: estimated needs Interventions: Ensure Enlive (each supplement provides 350kcal and 20 grams of protein) Patient's Body mass index is 20.11 kg/m.  Pressure Injury 05/18/21 Coccyx Mid Stage 1 -  Intact skin with non-blanchable redness of a localized area usually over a bony prominence. (Active)  05/18/21 1700  Location: Coccyx  Location Orientation: Mid  Staging: Stage 1 -  Intact skin with non-blanchable redness of a localized area usually over a bony prominence.  Wound Description (Comments):   Present on Admission: Yes     Pressure Injury 05/18/21 Buttocks Left Stage 2 -  Partial  thickness loss of dermis presenting as a shallow open injury with a red, pink wound bed without slough. (Active)  05/18/21 1700  Location: Buttocks  Location Orientation: Left  Staging: Stage 2 -  Partial thickness loss of dermis presenting as a shallow open injury with a red, pink wound bed without slough.  Wound Description (Comments):   Present on Admission: Yes   DVT prophylaxis: enoxaparin  (LOVENOX) injection 40 mg Start: 05/18/21 1000 Code Status:   Code Status: DNR  Family Communication: plan of care discussed with patient at bedside and updated daughter. Nursing spoke with daughter and is aware about pending pacement.  Status is: Inpatient Remains inpatient appropriate because:Inpatient level of care appropriate due to severity of illness Dispo: The patient is from: Home              Anticipated d/c is to: SNF once available              Patient currently medically stable for discharge.   Difficult to place patient No  Unresulted Labs (From admission, onward)    None      Medications reviewed:  Scheduled Meds:  arformoterol  15 mcg Nebulization BID   And   umeclidinium bromide  1 puff Inhalation Daily   And   budesonide (PULMICORT) nebulizer solution  1 mg Nebulization BID   aspirin EC  81 mg Oral Daily   carvedilol  3.125 mg Oral BID WC   docusate sodium  100 mg Oral BID   enoxaparin (LOVENOX) injection  40 mg Subcutaneous Q24H   feeding supplement  237 mL Oral BID BM   gabapentin  100 mg Oral QHS   hydrocortisone  25 mg Rectal BID   magnesium gluconate  500 mg Oral QHS   oxybutynin  5 mg Oral BID   pantoprazole  20 mg Oral Daily   polyethylene glycol  17 g Oral Daily   predniSONE  20 mg Oral Q breakfast   rOPINIRole  0.25 mg Oral QHS   rosuvastatin  10 mg Oral QHS   vitamin B-12  5,000 mcg Oral Daily   Continuous Infusions:   Consultants:see note  Procedures:see note Antimicrobials: Anti-infectives (From admission, onward)    Start     Dose/Rate Route Frequency Ordered Stop   05/19/21 0100  cefTRIAXone (ROCEPHIN) 1 g in sodium chloride 0.9 % 100 mL IVPB        1 g 200 mL/hr over 30 Minutes Intravenous Every 24 hours 05/18/21 0341 05/24/21 0057   05/18/21 0400  azithromycin (ZITHROMAX) 500 mg in sodium chloride 0.9 % 250 mL IVPB  Status:  Discontinued        500 mg 250 mL/hr over 60 Minutes Intravenous Every 24 hours 05/18/21 0341 05/23/21 0716    05/17/21 2345  cefTRIAXone (ROCEPHIN) 1 g in sodium chloride 0.9 % 100 mL IVPB        1 g 200 mL/hr over 30 Minutes Intravenous  Once 05/17/21 2333 05/18/21 0116      Culture/Microbiology    Component Value Date/Time   SDES  05/18/2021 0541    URINE, RANDOM Performed at [facility], [address], [place], [place] [address]    SPECREQUEST  05/18/2021 0541    NONE Performed at [facility], [address] [place], [place] [address]    CULT (A) 05/18/2021 0541    <10,000 COLONIES/mL INSIGNIFICANT GROWTH Performed at [facility], [address] [place], [place] [address]  REPTSTATUS 05/19/2021 FINAL 05/18/2021 0541    Other culture-see note  Objective: Vitals: Today's Vitals   05/25/21 2317 05/26/21 0002 05/26/21 0510 05/26/21 0631  BP:   (!) 144/84   Pulse:   76   Resp:   18   Temp:   98 F (36.7 C)   TempSrc:      SpO2:   99%   Weight:    60 kg  Height:      PainSc: 5  Asleep      Intake/Output Summary (Last 24 hours) at 05/26/2021 0736 Last data filed at 05/26/2021 0631 Gross per 24 hour  Intake 1410 ml  Output 2025 ml  Net -615 ml    Filed Weights   05/24/21 0500 05/25/21 0442 05/26/21 0631  Weight: 59 kg 59.5 kg 60 kg   Weight change: 0.5 kg  Intake/Output from previous day: 07/16 0701 - 07/17 0700 In: 1410 [P.O.:1410] Out: 2025 [Urine:2025] Intake/Output this shift: No intake/output data recorded. Filed Weights   05/24/21 0500 05/25/21 0442 05/26/21 0631  Weight: 59 kg 59.5 kg 60 kg   Examination: General exam: AAOx3, elderly frail not in acute distress HEENT:Oral mucosa moist, Ear/Nose WNL grossly, dentition normal. Respiratory system: bilaterally dry crackles, no use of accessory muscle Cardiovascular system: S1 & S2 +, No JVD,. Gastrointestinal system: Abdomen soft,NT,ND, BS+ Nervous System:Alert, awake, moving extremities and grossly nonfocal Extremities: no edema, distal peripheral  pulses palpable.  Skin: No rashes,no icterus. MSK: Normal muscle bulk,tone, power    Data Reviewed: I have personally reviewed following labs and imaging studies CBC: Recent Labs  Lab 05/21/21 0424 05/22/21 0441 05/23/21 1156 05/24/21 0419 05/25/21 0456  WBC 13.8* 13.6* 19.1* 13.4* 12.9*  NEUTROABS 10.5* 9.9*  --   --   --   HGB 10.9* 11.2* 11.7* 11.9* 11.6*  HCT 34.1* 35.1* 37.0* 36.7* 37.2*  MCV 94.2 94.4 94.6 95.6 96.9  PLT 223 260 263 286 313   Basic Metabolic Panel: Recent Labs  Lab 05/20/21 0407 05/21/21 0424 05/22/21 0441  NA 138 139 139  K 3.8 4.1 3.9  CL 100 104 101  CO2 29 28 30   GLUCOSE 140* 139* 148*  BUN 47* 48* 52*  CREATININE 0.76 0.84 0.95  CALCIUM 9.1 8.9 9.0    GFR: Estimated Creatinine Clearance: 40.4 mL/min (by C-G formula based on SCr of 0.95 mg/dL). Liver Function Tests: No results for input(s): AST, ALT, ALKPHOS, BILITOT, PROT, ALBUMIN in the last 168 hours.  No results for input(s): LIPASE, AMYLASE in the last 168 hours. No results for input(s): AMMONIA in the last 168 hours. Coagulation Profile: No results for input(s): INR, PROTIME in the last 168 hours.  Cardiac Enzymes: No results for input(s): CKTOTAL, CKMB, CKMBINDEX, TROPONINI in the last 168 hours. BNP (last 3 results) No results for input(s): PROBNP in the last 8760 hours. HbA1C: No results for input(s): HGBA1C in the last 72 hours. CBG: No results for input(s): GLUCAP in the last 168 hours. Lipid Profile: No results for input(s): CHOL, HDL, LDLCALC, TRIG, CHOLHDL, LDLDIRECT in the last 72 hours. Thyroid Function Tests: No results for input(s): TSH, T4TOTAL, FREET4, T3FREE, THYROIDAB in the last 72 hours. Anemia Panel: No results for input(s): VITAMINB12, FOLATE, FERRITIN, TIBC, IRON, RETICCTPCT in the last 72 hours. Sepsis Labs: Recent Labs  Lab 05/23/21 1313 05/24/21 0419 05/25/21 0456  PROCALCITON 0.11 <0.10 <0.10     Recent Results (from the past 240 hour(s))   Culture, blood (Routine x 2)  Status: None   Collection Time: 05/17/21 10:44 PM   Specimen: BLOOD  Result Value Ref Range Status   Specimen Description   Final    BLOOD LEFT ANTECUBITAL Performed at [facility], [address] [place], [place] [address]    Special Requests   Final    BOTTLES DRAWN AEROBIC AND ANAEROBIC Blood Culture adequate volume Performed at [facility], [address] [place], [place] [address]    Culture   Final    NO GROWTH 5 DAYS Performed at [facility], [address] [place], [place] [address]    Report Status 05/23/2021 FINAL  Final  Resp Panel by RT-PCR (Flu A&B, Covid) Nasopharyngeal Swab     Status: None   Collection Time: 05/17/21 10:51 PM   Specimen: Nasopharyngeal Swab; Nasopharyngeal(NP) swabs in vial transport medium  Result Value Ref Range Status   SARS Coronavirus 2 by RT PCR NEGATIVE NEGATIVE Final    Comment: (NOTE) SARS-CoV-2 target nucleic acids are NOT DETECTED.  The SARS-CoV-2 RNA is generally detectable in upper respiratory specimens during the acute phase of infection. The lowest concentration of SARS-CoV-2 viral copies this assay can detect is 138 copies/mL. A negative result does not preclude SARS-Cov-2 infection and should not be used as the sole basis for treatment or other patient management decisions. A negative result may occur with  improper specimen collection/handling, submission of specimen other than nasopharyngeal swab, presence of viral mutation(s) within the areas targeted by this assay, and inadequate number of viral copies(<138 copies/mL). A negative result must be combined with clinical observations, patient history, and epidemiological information. The expected result is Negative.  Fact Sheet for Patients:  BloggerCourse.comhttps://www.fda.gov/media/152166/download  Fact Sheet for Healthcare Providers:  SeriousBroker.ithttps://www.fda.gov/media/152162/download  This test is no t yet  approved or cleared by the [place] FDA and  has been authorized for detection and/or diagnosis of SARS-CoV-2 by FDA under an Emergency Use Authorization (EUA). This EUA will remain  in effect (meaning this test can be used) for the duration of the COVID-19 declaration under Section 564(b)(1) of the Act, 21 U.S.C.section 360bbb-3(b)(1), unless the authorization is terminated  or revoked sooner.       Influenza A by PCR NEGATIVE NEGATIVE Final   Influenza B by PCR NEGATIVE NEGATIVE Final    Comment: (NOTE) The Xpert Xpress SARS-CoV-2/FLU/RSV plus assay is intended as an aid in the diagnosis of influenza from Nasopharyngeal swab specimens and should not be used as a sole basis for treatment. Nasal washings and aspirates are unacceptable for Xpert Xpress SARS-CoV-2/FLU/RSV testing.  Fact Sheet for Patients: BloggerCourse.comhttps://www.fda.gov/media/152166/download  Fact Sheet for Healthcare Providers: SeriousBroker.ithttps://www.fda.gov/media/152162/download  This test is not yet approved or cleared by the [place] FDA and has been authorized for detection and/or diagnosis of SARS-CoV-2 by FDA under an Emergency Use Authorization (EUA). This EUA will remain in effect (meaning this test can be used) for the duration of the COVID-19 declaration under Section 564(b)(1) of the Act, 21 U.S.C. section 360bbb-3(b)(1), unless the authorization is terminated or revoked.  Performed at [facility], [address] [place], [place] [address]   Blood culture (routine x 2)     Status: None (Preliminary result)   Collection Time: 05/18/21  1:00 AM   Specimen: BLOOD  Result Value Ref Range Status   Specimen Description   Final    BLOOD SITE NOT SPECIFIED Performed at [facility], [address] [place], [place] [address]  Special Requests   Final    BOTTLES DRAWN AEROBIC AND ANAEROBIC Blood Culture adequate volume Performed at [facility], [address] [place], [place] [address]    Culture   Final    GRAM POSITIVE RODS CULTURE REINCUBATED FOR BETTER GROWTH CRITICAL RESULT CALLED TO, READ BACK BY AND VERIFIED WITH: PHARMD N.JOVAC AT 1420 ON 05/24/2021 BY T.SAAD. Performed at [facility], [address] [place], [place] [address]    Report Status PENDING  Incomplete  Urine culture     Status: Abnormal   Collection Time: 05/18/21  5:41 AM   Specimen: Urine, Random  Result Value Ref Range Status   Specimen Description   Final    URINE, RANDOM Performed at [facility], [address], [place], [place] [address]    Special Requests   Final    NONE Performed at [facility], [address], [place], [place] [address]    Culture (A)  Final    <10,000 COLONIES/mL INSIGNIFICANT GROWTH Performed at [facility], [address], [place], [place] [address]    Report Status 05/19/2021 FINAL  Final      Radiology Studies: No results found.   LOS: 8 days   Lanae Boast, MD [facility]  05/26/2021, 7:36 AM

## 2021-05-27 DIAGNOSIS — A419 Sepsis, unspecified organism: Secondary | ICD-10-CM | POA: Diagnosis not present

## 2021-05-27 DIAGNOSIS — R6520 Severe sepsis without septic shock: Secondary | ICD-10-CM | POA: Diagnosis not present

## 2021-05-27 LAB — BASIC METABOLIC PANEL
Anion gap: 10 (ref 5–15)
BUN: 35 mg/dL — ABNORMAL HIGH (ref 8–23)
CO2: 33 mmol/L — ABNORMAL HIGH (ref 22–32)
Calcium: 8.8 mg/dL — ABNORMAL LOW (ref 8.9–10.3)
Chloride: 95 mmol/L — ABNORMAL LOW (ref 98–111)
Creatinine, Ser: 0.88 mg/dL (ref 0.61–1.24)
GFR, Estimated: >60 mL/min (ref >60)
Glucose, Bld: 161 mg/dL — ABNORMAL HIGH (ref 70–99)
Potassium: 3.7 mmol/L (ref 3.5–5.1)
Sodium: 138 mmol/L (ref 135–145)

## 2021-05-27 MED ORDER — SODIUM CHLORIDE 0.9 % IV BOLUS
500.0000 mL | Freq: Once | INTRAVENOUS | Status: AC
  Administered 2021-05-27: 500 mL via INTRAVENOUS

## 2021-05-27 MED ORDER — LIP MEDEX EX OINT
TOPICAL_OINTMENT | CUTANEOUS | Status: AC
  Filled 2021-05-27: qty 7

## 2021-05-27 MED ORDER — LIP MEDEX EX OINT: TOPICAL_OINTMENT | CUTANEOUS | Status: DC | PRN

## 2021-05-27 MED ORDER — MIDODRINE HCL 5 MG PO TABS
2.5000 mg | ORAL_TABLET | Freq: Three times a day (TID) | ORAL | Status: DC
  Administered 2021-05-27 – 2021-05-28 (×3): 2.5 mg via ORAL
  Filled 2021-05-27 (×3): qty 1

## 2021-05-27 NOTE — Progress Notes (Signed)
Physical Therapy Treatment Patient Details Name: Justin Richmond MRN: [id number] DOB: [birth year] Today's Date: 05/27/2021    History of Present Illness 87 yo male admitted with Pna, sepsis. Hx of HB, PPM, CHF, OA, recurrent UTI, chronic pain, scoliosis, L knee fusion/leg length discrepancy, spinal stenosis, ILD    PT Comments    Pt continues to have complaints of dizziness when sitting at EOB and during standing. Assisted pt to EOB with min assist +1, pt required increased time and use of rail. At EOB BP dropped from 135/84 supine at rest to 118/80 in dependent sitting. Pt was able to stand from elevated EOB with RW and+2 assist for safety, continued complaints of dizziness from pt and BP 115/79. Returned pt to bed and assisted back to supine. Supine BP 140/83. Unable to attempt ambulation this session due to continued hypotension in sitting and standing.    Follow Up Recommendations  SNF     Equipment Recommendations  None recommended by PT    Recommendations for Other Services       Precautions / Restrictions Precautions Precautions: Fall Precaution Comments: monitor O2 Restrictions Weight Bearing Restrictions: No    Mobility  Bed Mobility Overal bed mobility: Needs Assistance Bed Mobility: Supine to Sit;Sit to Supine     Supine to sit: Min assist Sit to supine: Total assist;+2 for physical assistance   General bed mobility comments: pt required less assist to transistion to EOB but continues to require increased time. Some c/o od dizziness at EOB. Assisted back to bed due to continued hypotension.    Transfers Overall transfer level: Needs assistance Equipment used: Rolling walker (2 wheeled)   Sit to Stand: Min assist;+2 safety/equipment         General transfer comment: applied personal shoes (one has a lift insert) and assisted to standing for orthostatic BP reading with min assist +2 for safety and RW. VC's on proper push off. While standing BP continues to  drop. Unable to maintain standing for 3 minutes for aditional BP reading.  Ambulation/Gait             General Gait Details: unable to ambulate due to continued hypotension upon standing   Stairs             Wheelchair Mobility    Modified Rankin (Stroke Patients Only)       Balance Overall balance assessment: Needs assistance         Standing balance support: Bilateral upper extremity supported Standing balance-Leahy Scale: Poor                              Cognition Arousal/Alertness: Awake/alert Behavior During Therapy: WFL for tasks assessed/performed Overall Cognitive Status: Within Functional Limits for tasks assessed                                        Exercises      General Comments        Pertinent Vitals/Pain Pain Assessment: No/denies pain    Home Living                      Prior Function            PT Goals (current goals can now be found in the care plan section) Acute Rehab PT Goals Patient Stated Goal: get better. get home.  PT Goal Formulation: With patient Time For Goal Achievement: 06/04/21 Potential to Achieve Goals: Good    Frequency    Min 3X/week      PT Plan Current plan remains appropriate    Co-evaluation              AM-PAC PT "6 Clicks" Mobility   Outcome Measure  Help needed turning from your back to your side while in a flat bed without using bedrails?: A Lot Help needed moving from lying on your back to sitting on the side of a flat bed without using bedrails?: A Lot Help needed moving to and from a bed to a chair (including a wheelchair)?: A Lot Help needed standing up from a chair using your arms (e.g., wheelchair or bedside chair)?: A Lot Help needed to walk in hospital room?: A Lot Help needed climbing 3-5 steps with a railing? : Total 6 Click Score: 11    End of Session Equipment Utilized During Treatment: Gait belt;Oxygen Activity Tolerance:  Patient limited by fatigue;Other (comment) Patient left: in bed;with call bell/phone within reach;with bed alarm set Nurse Communication: Mobility status PT Visit Diagnosis: Muscle weakness (generalized) (M62.81);Difficulty in walking, not elsewhere classified (R26.2)     Time: 0350-0938 PT Time Calculation (min) (ACUTE ONLY): 19 min  Charges:  $Therapeutic Activity: 8-22 mins                     Alma Friendly, PTA Student  Acute Rehabilitation Services Pager : [phone] Office : [phone]    Alma Friendly 05/27/2021, 3:26 PM

## 2021-05-27 NOTE — TOC Progression Note (Signed)
Transition of Care [facility]) - Progression Note    Patient Details  Name: Justin Richmond MRN: [id number] Date of Birth: [birth year]  Transition of Care [facility]) CM/SW Contact  Darleene Cleaver, [place] Phone Number: 05/27/2021, 5:34 PM  Clinical Narrative:     Patient has more bed offers today, CSW to follow up with daughter on SNF choice.   Expected Discharge Plan: Skilled Nursing Facility Barriers to Discharge: SNF Pending bed offer  Expected Discharge Plan and Services Expected Discharge Plan: Skilled Nursing Facility   Discharge Planning Services: CM Consult Post Acute Care Choice: Home Health Living arrangements for the past 2 months: Single Family Home                           HH Arranged: PT HH Agency: [facility] Date [facility] Agency Contacted: 05/22/21 Time HH Agency Contacted: 1348 Representative spoke with at [facility] Agency: Misty Stanley   Social Determinants of Health (SDOH) Interventions    Readmission Risk Interventions No flowsheet data found.

## 2021-05-27 NOTE — Care Management Important Message (Signed)
Important Message  Patient Details IM Letter placed in Patients room. Name: Justin Richmond MRN: [id number] Date of Birth: 08/31/1924   Medicare Important Message Given:  Yes     Caren Macadam 05/27/2021, 12:20 PM

## 2021-05-27 NOTE — Progress Notes (Signed)
Nutrition Follow-up  DOCUMENTATION CODES:   Not applicable  INTERVENTION:  - continue Ensure Enlive BID.    NUTRITION DIAGNOSIS:   Increased nutrient needs related to acute illness as evidenced by estimated needs. -ongoing   GOAL:   Patient will meet greater than or equal to 90% of their needs -met  MONITOR:   PO intake, Supplement acceptance, Labs, Weight trends  ASSESSMENT:   87 year old male with medical history of hypercholesterolemia, HTN, hemorrhoids, chronic constipation, spinal stenosis, migraines, hearing loss, scoliosis, osteoarthritis, CHF, chronic back pain, and interstitial lung disease. He presented to the ED d/t worsening SOB, cough, fever, and chills x3 days.  Recently documented meal intakes: 7/13- 100% of dinner (478 kcal and 33 grams protein) 7/14- 100% of breakfast (352 kcal and 20 grams protein) 7/15- 100% of breakfast and 100% of lunch (total of 631 kcal and 52 grams protein) 7/16- 100% of all meals (total of 1022 kcal and 53 grams protein) 7/17- 95% of breakfast (326 kcal and 19 grams protein)  He has been accepting Ensure 100% of the time offered. If he consumes 100% of each bottle, he is consuming 700 kcal and 40 grams protein/day from Ensure supplements.  He is being weighed daily and weight has been stable throughout hospitalization. Moderate pitting edema to BLE documented in the edema section of flow sheet.   Per notes: - severe sepsis on admission--completed abx course, afebrile - will likely need to go home on O2 - acute on chronic CHF--diuretics being held and given IV fluid d/t orthostatic hypotension - generalized weakness-PT and OT recommend SNF   Labs reviewed; Cl: 95 mmol/l, BUN: 35 mg/dl, Ca: 8.8 mg/dl. Medications reviewed; 100 mg colace BID, 500 mg magonate/day, 20 mg oral protonix/day, 17 g miralax/day, 20 mg deltasone/day 7/14-7/18, 5000 mcg oral cyanocobalamin/day.    Diet Order:   Diet Order             Diet Heart Room  service appropriate? Yes; Fluid consistency: Thin; Fluid restriction: 1200 mL Fluid  Diet effective now                   EDUCATION NEEDS:   No education needs have been identified at this time  Skin:  Skin Assessment: Skin Integrity Issues: Skin Integrity Issues:: Stage I, Stage II Stage I: coccyx Stage II: L buttocks  Last BM:  7/18(type 4 x1)  Height:   Ht Readings from Last 1 Encounters:  05/22/21 '5\' 8"'  (1.727 m)    Weight:   Wt Readings from Last 1 Encounters:  05/27/21 59.1 kg      Estimated Nutritional Needs:  Kcal:  1600-1850 kcal Protein:  70-85 grams Fluid:  >/= 1.8 L/day     Jarome Matin, MS, RD, LDN, CNSC Inpatient Clinical Dietitian RD pager # available in [facility]  After hours/weekend pager # available in [facility]

## 2021-05-27 NOTE — Progress Notes (Signed)
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  He was seen in the ED and admitted for severe sepsis due to community-acquired pneumonia acute hypoxic respiratory failure acute on chronic diastolic CHF. Patient treated with iv antibiotics   Also managed with IV Lasix supplemental oxygen. Seen by PT OT initially home health PT was advised.  Subsequently patient was dizzy with walking and weak advised skilled nursing facility .  Subjective:  Seen this morning resting comfortably on the bed.  On 2 L nasal cannula.   Repeat orthostatic this morning positive drop from 105/82> 120/68 >: 88/48 and symptomatic understanding  Assessment & Plan:  Severe sepsis POA due to pneumonia/Community-acquired pneumonia Completed ceftriaxone/azithromycin.  Remains afebrile has had mild leukocytosis but with a steroid, procalcitonin has been negative, weaned off steroid Recent Labs  Lab 05/21/21 0424 05/22/21 0441 05/23/21 1156 05/23/21 1313 05/24/21 0419 05/25/21 0456  WBC 13.8* 13.6* 19.1*  --  13.4* 12.9*  PROCALCITON  --   --   --  0.11 <0.10 <0.10   Acute hypoxic aspiratory failure due to #1 remains on 2 L will likely need to go to home with 2 L  given he has ILD and CHF.  Acute on chronic diastolic WJX:BJYNWGNCHF:needing IV Lasix times once in the ED repeat dose was given next day again repeated 7/14. Patient was hypotensive orthostatic- so held off on further Lasix.he is needing some fluids for his orthostatic hypotension at this time.  Check BMP. Filed Weights   05/25/21 0442 05/26/21 0631  05/27/21 0457  Weight: 59.5 kg 60 kg 59.1 kg    Intake/Output Summary (Last 24 hours) at 05/27/2021 1048 Last data filed at 05/27/2021 1033 Gross per 24 hour  Intake 314.41 ml  Output 1700 ml  Net -1385.59 ml   Orthostatic hypotension: We will give  IVF 50 ml today am, add midodrine, continue compression stocking.    Hypertension:he is  orthostatic.  He is off Coreg.     Constipation: improved  Generalized weakness: Continue PT OT will need a skilled nursing facility.    FAO:ZHYQMVHQLD:continue his inhalers,IS,supplemental oxygen.Patient has crackles posteriorly.  Hyperlipidemia: On statin  Complete heart block with pacemaker in place.stable.  Hemorrhoid: cont his Anusol  Diet Order             Diet Heart Room service appropriate? Yes; Fluid consistency: Thin; Fluid restriction: 1200 mL Fluid  Diet effective now                   Nutrition Problem: Increased nutrient needs Etiology: acute illness Signs/Symptoms: estimated needs Interventions: Ensure Enlive (each supplement provides 350kcal and 20 grams of protein) Patient's Body mass index is 19.81 kg/m.  Pressure Injury 05/18/21 Coccyx Mid Stage 1 -  Intact skin with non-blanchable redness of a localized area usually over a bony prominence. (Active)  05/18/21 1700  Location: Coccyx  Location Orientation: Mid  Staging: Stage 1 -  Intact skin with non-blanchable redness of a localized area usually over a bony prominence.  Wound Description (Comments):   Present on Admission: Yes     Pressure  Injury 05/18/21 Buttocks Left Stage 2 -  Partial thickness loss of dermis presenting as a shallow open injury with a red, pink wound bed without slough. (Active)  05/18/21 1700  Location: Buttocks  Location Orientation: Left  Staging: Stage 2 -  Partial thickness loss of dermis presenting as a shallow open injury with a red, pink wound bed without slough.  Wound Description (Comments):   Present on Admission: Yes   DVT prophylaxis:  enoxaparin (LOVENOX) injection 40 mg Start: 05/18/21 1000 Code Status:   Code Status: DNR  Family Communication: plan of care discussed with patient at bedside and updated daughter. Nursing spoke with daughter and is aware about pending pacement.  Status is: Inpatient Remains inpatient appropriate because:Inpatient level of care appropriate due to severity of illness Dispo: The patient is from: Home              Anticipated d/c is to: SNF once orthostatic vitals improves and bed available.              Patient currently NOT medically stable for discharge.   Difficult to place patient No  Unresulted Labs (From admission, onward)    None      Medications reviewed:  Scheduled Meds:  lip balm       arformoterol  15 mcg Nebulization BID   And   umeclidinium bromide  1 puff Inhalation Daily   And   budesonide (PULMICORT) nebulizer solution  1 mg Nebulization BID   aspirin EC  81 mg Oral Daily   docusate sodium  100 mg Oral BID   enoxaparin (LOVENOX) injection  40 mg Subcutaneous Q24H   feeding supplement  237 mL Oral BID BM   gabapentin  100 mg Oral QHS   hydrocortisone  25 mg Rectal BID   magnesium gluconate  500 mg Oral QHS   midodrine  2.5 mg Oral TID WC   oxybutynin  5 mg Oral BID   pantoprazole  20 mg Oral Daily   polyethylene glycol  17 g Oral Daily   rOPINIRole  0.25 mg Oral QHS   rosuvastatin  10 mg Oral QHS   vitamin B-12  5,000 mcg Oral Daily   Continuous Infusions:  sodium chloride      Consultants:see note  Procedures:see note Antimicrobials: Anti-infectives (From admission, onward)    Start     Dose/Rate Route Frequency Ordered Stop   05/19/21 0100  cefTRIAXone (ROCEPHIN) 1 g in sodium chloride 0.9 % 100 mL IVPB        1 g 200 mL/hr over 30 Minutes Intravenous Every 24 hours 05/18/21 0341 05/24/21 0057   05/18/21 0400  azithromycin (ZITHROMAX) 500 mg in sodium chloride 0.9 % 250 mL IVPB  Status:  Discontinued        500 mg 250 mL/hr over 60 Minutes  Intravenous Every 24 hours 05/18/21 0341 05/23/21 0716   05/17/21 2345  cefTRIAXone (ROCEPHIN) 1 g in sodium chloride 0.9 % 100 mL IVPB        1 g 200 mL/hr over 30 Minutes Intravenous  Once 05/17/21 2333 05/18/21 0116      Culture/Microbiology    Component Value Date/Time   SDES  05/18/2021 0541    URINE, RANDOM Performed at [facility], [address], [place], [place] [address]    SPECREQUEST  05/18/2021 0541    NONE Performed at [facility], [address], [place], [place] [address]    CULT (A) 05/18/2021 0541    <  10,000 COLONIES/mL INSIGNIFICANT GROWTH Performed at [facility], [address], [place], [place] [address]    REPTSTATUS 05/19/2021 FINAL 05/18/2021 0541    Other culture-see note  Objective: Vitals: Today's Vitals   05/27/21 1007 05/27/21 1036 05/27/21 1038 05/27/21 1040  BP:  105/82 120/68 (!) 88/48  Pulse:  (!) 101 (!) 116 (!) 115  Resp:      Temp:      TempSrc:      SpO2:  95% 96% 95%  Weight:      Height:      PainSc: 0-No pain       Intake/Output Summary (Last 24 hours) at 05/27/2021 1048 Last data filed at 05/27/2021 1033 Gross per 24 hour  Intake 314.41 ml  Output 1700 ml  Net -1385.59 ml    Filed Weights   05/25/21 0442 05/26/21 0631 05/27/21 0457  Weight: 59.5 kg 60 kg 59.1 kg   Weight change: -0.9 kg  Intake/Output from previous day: 07/17 0701 - 07/18 0700 In: 554.4 [P.O.:480; IV Piggyback:74.4] Out: 1150 [Urine:1150] Intake/Output this shift: Total I/O In: -  Out: 550 [Urine:550] Filed Weights   05/25/21 0442 05/26/21 0631 05/27/21 0457  Weight: 59.5 kg 60 kg 59.1 kg   Examination: General exam: AAOx3, elderly, pleasant, on nasal cannula. HEENT:Oral mucosa moist, Ear/Nose WNL grossly, dentition normal. Respiratory system: bilaterally posterior crackles- dry, no use of accessory muscle Cardiovascular system: S1 & S2 +, No JVD,. Gastrointestinal system: Abdomen soft, NT,ND,  BS+ Nervous System:Alert, awake, moving extremities and grossly nonfocal Extremities: no edema, distal peripheral pulses palpable.  Skin: No rashes,no icterus. MSK: Normal muscle bulk,tone, power    Data Reviewed: I have personally reviewed following labs and imaging studies CBC: Recent Labs  Lab 05/21/21 0424 05/22/21 0441 05/23/21 1156 05/24/21 0419 05/25/21 0456  WBC 13.8* 13.6* 19.1* 13.4* 12.9*  NEUTROABS 10.5* 9.9*  --   --   --   HGB 10.9* 11.2* 11.7* 11.9* 11.6*  HCT 34.1* 35.1* 37.0* 36.7* 37.2*  MCV 94.2 94.4 94.6 95.6 96.9  PLT 223 260 263 286 313   Basic Metabolic Panel: Recent Labs  Lab 05/21/21 0424 05/22/21 0441  NA 139 139  K 4.1 3.9  CL 104 101  CO2 28 30  GLUCOSE 139* 148*  BUN 48* 52*  CREATININE 0.84 0.95  CALCIUM 8.9 9.0    GFR: Estimated Creatinine Clearance: 39.7 mL/min (by C-G formula based on SCr of 0.95 mg/dL). Liver Function Tests: No results for input(s): AST, ALT, ALKPHOS, BILITOT, PROT, ALBUMIN in the last 168 hours.  No results for input(s): LIPASE, AMYLASE in the last 168 hours. No results for input(s): AMMONIA in the last 168 hours. Coagulation Profile: No results for input(s): INR, PROTIME in the last 168 hours.  Cardiac Enzymes: No results for input(s): CKTOTAL, CKMB, CKMBINDEX, TROPONINI in the last 168 hours. BNP (last 3 results) No results for input(s): PROBNP in the last 8760 hours. HbA1C: No results for input(s): HGBA1C in the last 72 hours. CBG: No results for input(s): GLUCAP in the last 168 hours. Lipid Profile: No results for input(s): CHOL, HDL, LDLCALC, TRIG, CHOLHDL, LDLDIRECT in the last 72 hours. Thyroid Function Tests: No results for input(s): TSH, T4TOTAL, FREET4, T3FREE, THYROIDAB in the last 72 hours. Anemia Panel: No results for input(s): VITAMINB12, FOLATE, FERRITIN, TIBC, IRON, RETICCTPCT in the last 72 hours. Sepsis Labs: Recent Labs  Lab 05/23/21 1313 05/24/21 0419 05/25/21 0456  PROCALCITON  0.11 <0.10 <0.10  Recent Results (from the past 240 hour(s))  Culture, blood (Routine x 2)     Status: None   Collection Time: 05/17/21 10:44 PM   Specimen: BLOOD  Result Value Ref Range Status   Specimen Description   Final    BLOOD LEFT ANTECUBITAL Performed at [facility], [address], [place], [place] [address]    Special Requests   Final    BOTTLES DRAWN AEROBIC AND ANAEROBIC Blood Culture adequate volume Performed at [facility], [address], [place], [place] [address]    Culture   Final    NO GROWTH 5 DAYS Performed at [facility], [address] [place], [place] [address]    Report Status 05/23/2021 FINAL  Final  Resp Panel by RT-PCR (Flu A&B, Covid) Nasopharyngeal Swab     Status: None   Collection Time: 05/17/21 10:51 PM   Specimen: Nasopharyngeal Swab; Nasopharyngeal(NP) swabs in vial transport medium  Result Value Ref Range Status   SARS Coronavirus 2 by RT PCR NEGATIVE NEGATIVE Final    Comment: (NOTE) SARS-CoV-2 target nucleic acids are NOT DETECTED.  The SARS-CoV-2 RNA is generally detectable in upper respiratory specimens during the acute phase of infection. The lowest concentration of SARS-CoV-2 viral copies this assay can detect is 138 copies/mL. A negative result does not preclude SARS-Cov-2 infection and should not be used as the sole basis for treatment or other patient management decisions. A negative result may occur with  improper specimen collection/handling, submission of specimen other than nasopharyngeal swab, presence of viral mutation(s) within the areas targeted by this assay, and inadequate number of viral copies(<138 copies/mL). A negative result must be combined with clinical observations, patient history, and epidemiological information. The expected result is Negative.  Fact Sheet for Patients:  BloggerCourse.com  Fact Sheet for Healthcare Providers:   SeriousBroker.it  This test is no t yet approved or cleared by the [place] FDA and  has been authorized for detection and/or diagnosis of SARS-CoV-2 by FDA under an Emergency Use Authorization (EUA). This EUA will remain  in effect (meaning this test can be used) for the duration of the COVID-19 declaration under Section 564(b)(1) of the Act, 21 U.S.C.section 360bbb-3(b)(1), unless the authorization is terminated  or revoked sooner.       Influenza A by PCR NEGATIVE NEGATIVE Final   Influenza B by PCR NEGATIVE NEGATIVE Final    Comment: (NOTE) The Xpert Xpress SARS-CoV-2/FLU/RSV plus assay is intended as an aid in the diagnosis of influenza from Nasopharyngeal swab specimens and should not be used as a sole basis for treatment. Nasal washings and aspirates are unacceptable for Xpert Xpress SARS-CoV-2/FLU/RSV testing.  Fact Sheet for Patients: BloggerCourse.com  Fact Sheet for Healthcare Providers: SeriousBroker.it  This test is not yet approved or cleared by the [place] FDA and has been authorized for detection and/or diagnosis of SARS-CoV-2 by FDA under an Emergency Use Authorization (EUA). This EUA will remain in effect (meaning this test can be used) for the duration of the COVID-19 declaration under Section 564(b)(1) of the Act, 21 U.S.C. section 360bbb-3(b)(1), unless the authorization is terminated or revoked.  Performed at [facility], [address], [place], [place] [address]   Blood culture (routine x 2)     Status: Abnormal   Collection Time: 05/18/21  1:00 AM   Specimen: BLOOD  Result Value Ref Range Status   Specimen Description   Final    BLOOD SITE NOT SPECIFIED  Performed at [facility], [address] [place], [place] [address]    Special Requests   Final    BOTTLES DRAWN AEROBIC AND ANAEROBIC Blood Culture adequate  volume Performed at [facility], [address] [place], [place] [address]    Culture (A)  Final    CORYNEBACTERIUM SPECIES Standardized susceptibility testing for this organism is not available. CRITICAL RESULT CALLED TO, READ BACK BY AND VERIFIED WITH: PHARMD N.JOVAC AT 1420 ON 05/24/2021 BY T.SAAD. Performed at [facility], [address] [place], [place] [address]    Report Status 05/26/2021 FINAL  Final  Urine culture     Status: Abnormal   Collection Time: 05/18/21  5:41 AM   Specimen: Urine, Random  Result Value Ref Range Status   Specimen Description   Final    URINE, RANDOM Performed at [facility], [address], [place], [place] [address]    Special Requests   Final    NONE Performed at [facility], [address], [place], [place] [address]    Culture (A)  Final    <10,000 COLONIES/mL INSIGNIFICANT GROWTH Performed at [facility], [address] [place], [place] [address]    Report Status 05/19/2021 FINAL  Final      Radiology Studies: No results found.   LOS: 9 days   Lanae Boast, MD [facility]  05/27/2021, 10:48 AM

## 2021-05-28 DIAGNOSIS — A419 Sepsis, unspecified organism: Secondary | ICD-10-CM | POA: Diagnosis not present

## 2021-05-28 DIAGNOSIS — R6520 Severe sepsis without septic shock: Secondary | ICD-10-CM | POA: Diagnosis not present

## 2021-05-28 MED ORDER — COSYNTROPIN 0.25 MG IJ SOLR
0.2500 mg | Freq: Once | INTRAMUSCULAR | Status: AC
  Administered 2021-05-29: 0.25 mg via INTRAVENOUS
  Filled 2021-05-28: qty 0.25

## 2021-05-28 MED ORDER — SODIUM CHLORIDE 0.9 % IV BOLUS
500.0000 mL | Freq: Once | INTRAVENOUS | Status: AC
  Administered 2021-05-28: 500 mL via INTRAVENOUS

## 2021-05-28 MED ORDER — MIDODRINE HCL 5 MG PO TABS
5.0000 mg | ORAL_TABLET | Freq: Three times a day (TID) | ORAL | Status: DC
  Administered 2021-05-28 – 2021-05-30 (×5): 5 mg via ORAL
  Filled 2021-05-28 (×6): qty 1

## 2021-05-28 NOTE — Progress Notes (Signed)
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  He was seen in the ED and admitted for severe sepsis due to community-acquired pneumonia acute hypoxic respiratory failure acute on chronic diastolic CHF. Patient treated with iv antibiotics   Also managed with IV Lasix supplemental oxygen. Seen by PT OT initially home health PT was advised.  Subsequently patient was dizzy with walking and weak advised skilled nursing facility .  Subjective: Some dizziness on standing.  He was using bedside commode Orthostatic blood pressure dropped to 83 from 113. On 2L [place],no other new complaints.  Assessment & Plan:  Severe sepsis POA due to pneumonia/Community-acquired pneumonia Completed ceftriaxone/azithromycin.Remains afebrile has had mild leukocytosis but with a steroid, procalcitonin has been negative, weaned off steroid Recent Labs  Lab 05/22/21 0441 05/23/21 1156 05/23/21 1313 05/24/21 0419 05/25/21 0456  WBC 13.6* 19.1*  --  13.4* 12.9*  PROCALCITON  --   --  0.11 <0.10 <0.10   Acute hypoxic aspiratory failure due to #1 remains on 2 L will likely need to go to home with 2 L  given he has ILD and CHF.  Continue supplemental oxygen  Acute on chronic diastolic PJA:SNKNLZJ IV Lasix in the ED and x2 as inpatient.  Not having orthostatic hypotension needing IV fluids.   Filed Weights   05/26/21 0631 05/27/21 0457 05/28/21 0700  Weight: 60 kg 59.1 kg 58.2 kg    Intake/Output Summary (Last 24 hours) at 05/28/2021 1257 Last data filed at 05/28/2021 1213 Gross per 24 hour  Intake  2064.04 ml  Output 1000 ml  Net 1064.04 ml   Orthostatic hypotension: We will give 500 ML NS bolus,increase midodrine to 5 mg 3 times daily, and cont compression stocking.  Patient was on a steroid and has been discontinued-will benefit with cosyntropin stim test in the morning.  Hypertension:he is orthostatic.He is off Coreg- on last visit he was upped to 12.5 mg and sicne then BP has been fluctuating.     Constipation: improved.Can use as needed stool softener  Generalized weakness:Remains weak and dizzy.  Continue PT OT.   Continue.  QBH:ALPFXTKW his inhalers,IS,supplemental oxygen.Patient has crackles posteriorly.  Hyperlipidemia: Statin continue  Complete heart block with pacemaker in place.stable.  Hemorrhoid: Cont Anusol  Diet Order             Diet Heart Room service appropriate? Yes; Fluid consistency: Thin; Fluid restriction: 1200 mL Fluid  Diet effective now                   Nutrition Problem: Increased nutrient needs Etiology: acute illness Signs/Symptoms: estimated needs Interventions: Ensure Enlive (each supplement provides 350kcal and 20 grams of protein) Patient's Body mass index is 19.51 kg/m.  Pressure Injury 05/18/21 Coccyx Mid Stage 1 -  Intact skin with non-blanchable redness of a localized area usually over a bony prominence. (Active)  05/18/21 1700  Location: Coccyx  Location Orientation: Mid  Staging: Stage 1 -  Intact skin with non-blanchable redness of a localized area usually over a bony prominence.  Wound Description (Comments):   Present on Admission: Yes  Pressure Injury 05/18/21 Buttocks Left Stage 2 -  Partial thickness loss of dermis presenting as a shallow open injury with a red, pink wound bed without slough. (Active)  05/18/21 1700  Location: Buttocks  Location Orientation: Left  Staging: Stage 2 -  Partial thickness loss of dermis presenting as a shallow open injury with a red, pink wound bed without slough.  Wound  Description (Comments):   Present on Admission: Yes   DVT prophylaxis: enoxaparin (LOVENOX) injection 40 mg Start: 05/18/21 1000 Code Status:   Code Status: DNR  Family Communication: plan of care discussed with patient at bedside and updated daughter again today.  Status is: Inpatient Remains inpatient appropriate because:Inpatient level of care appropriate due to severity of illness Dispo: The patient is from: Home              Anticipated d/c is to: SNF once orthostatic vitals improves and bed available.              Patient currently NOT medically stable for discharge.   Difficult to place patient No  Unresulted Labs (From admission, onward)    None      Medications reviewed:  Scheduled Meds:  arformoterol  15 mcg Nebulization BID   And   umeclidinium bromide  1 puff Inhalation Daily   And   budesonide (PULMICORT) nebulizer solution  1 mg Nebulization BID   aspirin EC  81 mg Oral Daily   docusate sodium  100 mg Oral BID   enoxaparin (LOVENOX) injection  40 mg Subcutaneous Q24H   feeding supplement  237 mL Oral BID BM   gabapentin  100 mg Oral QHS   hydrocortisone  25 mg Rectal BID   magnesium gluconate  500 mg Oral QHS   midodrine  5 mg Oral TID WC   oxybutynin  5 mg Oral BID   pantoprazole  20 mg Oral Daily   polyethylene glycol  17 g Oral Daily   rOPINIRole  0.25 mg Oral QHS   rosuvastatin  10 mg Oral QHS   vitamin B-12  5,000 mcg Oral Daily   Continuous Infusions:  sodium chloride      Consultants:see note  Procedures:see note Antimicrobials: Anti-infectives (From admission, onward)    Start     Dose/Rate Route Frequency Ordered Stop   05/19/21 0100  cefTRIAXone (ROCEPHIN) 1 g in sodium chloride 0.9 % 100 mL IVPB        1 g 200 mL/hr over 30 Minutes Intravenous Every 24 hours 05/18/21 0341 05/24/21 0057   05/18/21 0400  azithromycin (ZITHROMAX) 500 mg in sodium chloride 0.9 % 250 mL IVPB  Status:  Discontinued        500 mg 250 mL/hr over 60 Minutes  Intravenous Every 24 hours 05/18/21 0341 05/23/21 0716   05/17/21 2345  cefTRIAXone (ROCEPHIN) 1 g in sodium chloride 0.9 % 100 mL IVPB        1 g 200 mL/hr over 30 Minutes Intravenous  Once 05/17/21 2333 05/18/21 0116      Culture/Microbiology    Component Value Date/Time   SDES  05/18/2021 0541    URINE, RANDOM Performed at [facility], [address] [place], [place] [address]    SPECREQUEST  05/18/2021 0541    NONE Performed at [facility], [address] [place], [place] [address]    CULT (A) 05/18/2021 0541    <10,000 COLONIES/mL INSIGNIFICANT GROWTH Performed at [facility], [address]  [place], [place] [address]    REPTSTATUS 05/19/2021 FINAL 05/18/2021 0541    Other culture-see note  Objective: Vitals: Today's Vitals   05/28/21 1148 05/28/21 1214 05/28/21 1217 05/28/21 1219  BP: 120/89 (!) 147/86 (!) 113/91 (!) 83/58  Pulse: 93 99 (!) 101 (!) 106  Resp: 20   18  Temp: 97.7 F (36.5 C) 97.7 F (36.5 C)    TempSrc:      SpO2: 97% 98% 98% 97%  Weight:      Height:      PainSc:        Intake/Output Summary (Last 24 hours) at 05/28/2021 1257 Last data filed at 05/28/2021 1213 Gross per 24 hour  Intake 2064.04 ml  Output 1000 ml  Net 1064.04 ml    Filed Weights   05/26/21 0631 05/27/21 0457 05/28/21 0700  Weight: 60 kg 59.1 kg 58.2 kg   Weight change: -0.9 kg  Intake/Output from previous day: 07/18 0701 - 07/19 0700 In: 1744 [P.O.:1240; IV Piggyback:504] Out: 1550 [Urine:1550] Intake/Output this shift: Total I/O In: 840 [P.O.:840] Out: -  Filed Weights   05/26/21 0631 05/27/21 0457 05/28/21 0700  Weight: 60 kg 59.1 kg 58.2 kg   Examination: General exam: AAOx 3, elderly frail, weak appearing, on nasal cannula.  HEENT:Oral mucosa moist, Ear/Nose WNL grossly, dentition normal. Respiratory system: bilaterally diminished,no use of accessory muscle Cardiovascular system: S1 & S2 +, No  JVD,. Gastrointestinal system: Abdomen soft,NT,ND, BS+ Nervous System:Alert, awake, moving extremities and grossly nonfocal Extremities: no edema, distal peripheral pulses palpable.  Skin: No rashes,no icterus. MSK: Normal muscle bulk,tone, power    Data Reviewed: I have personally reviewed following labs and imaging studies CBC: Recent Labs  Lab 05/22/21 0441 05/23/21 1156 05/24/21 0419 05/25/21 0456  WBC 13.6* 19.1* 13.4* 12.9*  NEUTROABS 9.9*  --   --   --   HGB 11.2* 11.7* 11.9* 11.6*  HCT 35.1* 37.0* 36.7* 37.2*  MCV 94.4 94.6 95.6 96.9  PLT 260 263 286 313   Basic Metabolic Panel: Recent Labs  Lab 05/22/21 0441 05/27/21 1122  NA 139 138  K 3.9 3.7  CL 101 95*  CO2 30 33*  GLUCOSE 148* 161*  BUN 52* 35*  CREATININE 0.95 0.88  CALCIUM 9.0 8.8*   GFR: Estimated Creatinine Clearance: 42.3 mL/min (by C-G formula based on SCr of 0.88 mg/dL). Liver Function Tests: No results for input(s): AST, ALT, ALKPHOS, BILITOT, PROT, ALBUMIN in the last 168 hours.  No results for input(s): LIPASE, AMYLASE in the last 168 hours. No results for input(s): AMMONIA in the last 168 hours. Coagulation Profile: No results for input(s): INR, PROTIME in the last 168 hours.  Cardiac Enzymes: No results for input(s): CKTOTAL, CKMB, CKMBINDEX, TROPONINI in the last 168 hours. BNP (last 3 results) No results for input(s): PROBNP in the last 8760 hours. HbA1C: No results for input(s): HGBA1C in the last 72 hours. CBG: No results for input(s): GLUCAP in the last 168 hours. Lipid Profile: No results for input(s): CHOL, HDL, LDLCALC, TRIG, CHOLHDL, LDLDIRECT in the last 72 hours. Thyroid Function Tests: No results for input(s): TSH, T4TOTAL, FREET4, T3FREE, THYROIDAB in the last 72 hours. Anemia Panel: No results for input(s): VITAMINB12, FOLATE, FERRITIN, TIBC, IRON, RETICCTPCT in the last 72 hours. Sepsis Labs: Recent Labs  Lab 05/23/21 1313 05/24/21 0419 05/25/21 0456   PROCALCITON 0.11 <0.10 <0.10    No results found for this or any previous visit (from the past 240 hour(s)).   Radiology Studies:  No results found.   LOS: 10 days   Lanae Boast, MD [facility]  05/28/2021, 12:57 PM

## 2021-05-28 NOTE — TOC Progression Note (Signed)
Transition of Care [facility]) - Progression Note    Patient Details  Name: Justin Richmond MRN: [id number] Date of Birth: [birth year]  Transition of Care [facility]) CM/SW Contact  Daunte Oestreich, Olegario Messier, RN Phone Number: 05/28/2021, 12:59 PM  Clinical Narrative: Sherron Monday to dtr [facility]-bed offers given-await choice.      Expected Discharge Plan: Skilled Nursing Facility Barriers to Discharge: Continued Medical Work up  Expected Discharge Plan and Services Expected Discharge Plan: Skilled Nursing Facility   Discharge Planning Services: CM Consult Post Acute Care Choice: Home Health Living arrangements for the past 2 months: Single Family Home                           HH Arranged: PT HH Agency: [facility] Date [facility] Agency Contacted: 05/22/21 Time HH Agency Contacted: 1348 Representative spoke with at [facility] Agency: Misty Stanley   Social Determinants of Health (SDOH) Interventions    Readmission Risk Interventions No flowsheet data found.

## 2021-05-29 DIAGNOSIS — R6520 Severe sepsis without septic shock: Secondary | ICD-10-CM | POA: Diagnosis not present

## 2021-05-29 DIAGNOSIS — A419 Sepsis, unspecified organism: Secondary | ICD-10-CM | POA: Diagnosis not present

## 2021-05-29 LAB — ACTH STIMULATION, 3 TIME POINTS
Cortisol, 30 Min: 26.6 ug/dL
Cortisol, 60 Min: 28.5 ug/dL
Cortisol, Base: 16.9 ug/dL

## 2021-05-29 MED ORDER — SODIUM CHLORIDE 0.9 % IV SOLN: INTRAVENOUS | Status: DC

## 2021-05-29 NOTE — Progress Notes (Signed)
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  He was seen in the ED and admitted for severe sepsis due to community-acquired pneumonia acute hypoxic respiratory failure acute on chronic diastolic CHF. Patient treated with iv antibiotics   Also managed with IV Lasix supplemental oxygen. Seen by PT OT initially home health PT was advised.  Subsequently patient was dizzy with walking and weak advised skilled nursing facility .  Subjective: C/O being weak, not much strength [place] 02. No other new complaints   Assessment & Plan:  Severe sepsis POA due to pneumonia/Community-acquired pneumonia Completed ceftriaxone/azithromycin.Remains afebrile has had mild leukocytosis but with a steroid, procalcitonin has been negative, weaned off steroid. Recent Labs  Lab 05/23/21 1156 05/23/21 1313 05/24/21 0419 05/25/21 0456  WBC 19.1*  --  13.4* 12.9*  PROCALCITON  --  0.11 <0.10 <0.10   Acute hypoxic aspiratory failure due to #1 continue supplemental oxygen maintain at 2 L also for saturation of 92%-will need outpatient need to go to home with 2 L  given he has ILD and CHF. Continue supplemental oxygen  Acute on chronic diastolic NFA:OZHYQMVCHF:needing IV Lasix in the ED and x2 as inpatient. Now patient is having orthostatic hypotension.   Filed Weights   05/27/21 0457 05/28/21 0700 05/29/21 0500  Weight: 59.1 kg 58.2 kg 60.1 kg    Intake/Output Summary (Last 24 hours) at 05/29/2021 1414 Last data filed at 05/29/2021 1409 Gross per 24 hour  Intake 777 ml  Output 2125 ml  Net -1348 ml    Orthostatic hypotension: Status post IV fluids multiple doses.  Cosyntropin test negative.  Continue midodrine 5 mg 3 times daily compression stocking.  We will keep on IV fluids overnight 50 mill per hour if still orthostatic positive.  Hypertension:he is orthostatic.He is off Coreg- on last visit he was upped to 12.5 mg and since then BP has been fluctuating.     Constipation: Continue as needed laxatives  Generalized weakness:Remains weak and dizzy.  Continue PT OT he is being planned for skilled nursing facility hopefully next day or 2  HQI:ONGEXBMWLD:continue his inhalers,IS,supplemental oxygen.Patient has crackles posteriorly.  Hyperlipidemia: Current statin  Complete heart block with pacemaker in place.stable.  Hemorrhoid: Cont Anusol  Diet Order             Diet Heart Room service appropriate? Yes; Fluid consistency: Thin; Fluid restriction: 1200 mL Fluid  Diet effective now                   Nutrition Problem: Increased nutrient needs Etiology: acute illness Signs/Symptoms: estimated needs Interventions: Ensure Enlive (each supplement provides 350kcal and 20 grams of protein) Patient's Body mass index is 20.15 kg/m.  Pressure Injury 05/18/21 Coccyx Mid Stage 1 -  Intact skin with non-blanchable redness of a localized area usually over a bony prominence. (Active)  05/18/21 1700  Location: Coccyx  Location Orientation: Mid  Staging: Stage 1 -  Intact skin with non-blanchable redness of a localized area usually over a bony prominence.  Wound Description (Comments):   Present on Admission: Yes  Pressure Injury 05/18/21 Buttocks Left Stage 2 -  Partial thickness loss of dermis presenting as a shallow open injury with a red, pink wound bed without slough. (Active)  05/18/21 1700  Location: Buttocks  Location Orientation: Left  Staging: Stage 2 -  Partial thickness loss of dermis presenting as a shallow open injury with a red, pink wound bed without slough.  Wound  Description (Comments):   Present on Admission: Yes   DVT prophylaxis: enoxaparin (LOVENOX) injection 40 mg Start: 05/18/21 1000 Code Status:   Code Status: DNR  Family Communication: plan of care discussed with patient at bedside and updated daughter again today.  Status is: Inpatient Remains inpatient appropriate because:Inpatient level of care appropriate due to severity of illness Dispo: The patient is from: Home              Anticipated d/c is to: SNF once orthostatic vitals improves and bed available.              Patient currently NOT medically stable for discharge.   Difficult to place patient No  Unresulted Labs (From admission, onward)     Start     Ordered   05/29/21 2100  SARS CORONAVIRUS 2 (TAT 6-24 HRS) Nasopharyngeal Nasopharyngeal Swab  Once,   R       Question Answer Comment  Is this test for diagnosis or screening Screening   Symptomatic for COVID-19 as defined by CDC No   Hospitalized for COVID-19 No   Admitted to ICU for COVID-19 No   Previously tested for COVID-19 Yes   Resident in a congregate (group) care setting Yes   Employed in healthcare setting No   Has patient completed COVID vaccination(s) (2 doses of Pfizer/Moderna 1 dose of [employer]) Unknown      05/29/21 1306           Medications reviewed:  Scheduled Meds:  arformoterol  15 mcg Nebulization BID   And   umeclidinium bromide  1 puff Inhalation Daily   And   budesonide (PULMICORT) nebulizer solution  1 mg Nebulization BID   aspirin EC  81 mg Oral Daily   docusate sodium  100 mg Oral BID   enoxaparin (LOVENOX) injection  40 mg Subcutaneous Q24H   feeding supplement  237 mL Oral BID BM   gabapentin  100 mg Oral QHS   hydrocortisone  25 mg Rectal BID   magnesium gluconate  500 mg Oral QHS   midodrine  5 mg Oral TID WC   oxybutynin  5 mg Oral BID   pantoprazole  20 mg Oral Daily   polyethylene glycol  17 g Oral Daily   rOPINIRole  0.25 mg Oral QHS   rosuvastatin  10 mg Oral QHS    vitamin B-12  5,000 mcg Oral Daily   Continuous Infusions:    Consultants:see note  Procedures:see note Antimicrobials: Anti-infectives (From admission, onward)    Start     Dose/Rate Route Frequency Ordered Stop   05/19/21 0100  cefTRIAXone (ROCEPHIN) 1 g in sodium chloride 0.9 % 100 mL IVPB        1 g 200 mL/hr over 30 Minutes Intravenous Every 24 hours 05/18/21 0341 05/24/21 0057   05/18/21 0400  azithromycin (ZITHROMAX) 500 mg in sodium chloride 0.9 % 250 mL IVPB  Status:  Discontinued        500 mg 250 mL/hr over 60 Minutes Intravenous Every 24 hours 05/18/21 0341 05/23/21 0716   05/17/21 2345  cefTRIAXone (  ROCEPHIN) 1 g in sodium chloride 0.9 % 100 mL IVPB        1 g 200 mL/hr over 30 Minutes Intravenous  Once 05/17/21 2333 05/18/21 0116      Culture/Microbiology    Component Value Date/Time   SDES  05/18/2021 0541    URINE, RANDOM Performed at [facility], [address], [place], [place] [address]    SPECREQUEST  05/18/2021 0541    NONE Performed at [facility], [address], [place], [place] [address]    CULT (A) 05/18/2021 0541    <10,000 COLONIES/mL INSIGNIFICANT GROWTH Performed at [facility], [address] [place], [place] [address]    REPTSTATUS 05/19/2021 FINAL 05/18/2021 0541    Other culture-see note  Objective: Vitals: Today's Vitals   05/29/21 0839 05/29/21 0840 05/29/21 0900 05/29/21 1305  BP:    128/76  Pulse:    87  Resp:    18  Temp:    97.8 F (36.6 C)  TempSrc:    Oral  SpO2: 97% 97%  100%  Weight:      Height:      PainSc:   0-No pain     Intake/Output Summary (Last 24 hours) at 05/29/2021 1414 Last data filed at 05/29/2021 1409 Gross per 24 hour  Intake 777 ml  Output 2125 ml  Net -1348 ml    Filed Weights   05/27/21 0457 05/28/21 0700 05/29/21 0500  Weight: 59.1 kg 58.2 kg 60.1 kg   Weight change: 1.9 kg  Intake/Output from previous day: 07/19 0701 - 07/20  0700 In: 1137 [P.O.:1137] Out: 1675 [Urine:1675] Intake/Output this shift: Total I/O In: 480 [P.O.:480] Out: 450 [Urine:450] Filed Weights   05/27/21 0457 05/28/21 0700 05/29/21 0500  Weight: 59.1 kg 58.2 kg 60.1 kg   Examination: General exam: AAOx3, weak, mild vertigo last older than stated age, weak appearing. HEENT:Oral mucosa moist, Ear/Nose WNL grossly, dentition normal. Respiratory system: bilaterally diminished with basal crackles dry,no use of accessory muscle Cardiovascular system: S1 & S2 +, No JVD,. Gastrointestinal system: Abdomen soft,NT,ND, BS+ Nervous System:Alert, awake, moving extremities and grossly nonfocal Extremities: No edema, distal peripheral pulses palpable.  Skin: No rashes,no icterus. MSK: Normal muscle bulk,tone, power    Data Reviewed: I have personally reviewed following labs and imaging studies CBC: Recent Labs  Lab 05/23/21 1156 05/24/21 0419 05/25/21 0456  WBC 19.1* 13.4* 12.9*  HGB 11.7* 11.9* 11.6*  HCT 37.0* 36.7* 37.2*  MCV 94.6 95.6 96.9  PLT 263 286 313   Basic Metabolic Panel: Recent Labs  Lab 05/27/21 1122  NA 138  K 3.7  CL 95*  CO2 33*  GLUCOSE 161*  BUN 35*  CREATININE 0.88  CALCIUM 8.8*   GFR: Estimated Creatinine Clearance: 43.6 mL/min (by C-G formula based on SCr of 0.88 mg/dL). Liver Function Tests: No results for input(s): AST, ALT, ALKPHOS, BILITOT, PROT, ALBUMIN in the last 168 hours.  No results for input(s): LIPASE, AMYLASE in the last 168 hours. No results for input(s): AMMONIA in the last 168 hours. Coagulation Profile: No results for input(s): INR, PROTIME in the last 168 hours.  Cardiac Enzymes: No results for input(s): CKTOTAL, CKMB, CKMBINDEX, TROPONINI in the last 168 hours. BNP (last 3 results) No results for input(s): PROBNP in the last 8760 hours. HbA1C: No results for input(s): HGBA1C in the last 72 hours. CBG: No results for input(s): GLUCAP in the last 168 hours. Lipid Profile: No  results for input(s):  CHOL, HDL, LDLCALC, TRIG, CHOLHDL, LDLDIRECT in the last 72 hours. Thyroid Function Tests: No results for input(s): TSH, T4TOTAL, FREET4, T3FREE, THYROIDAB in the last 72 hours. Anemia Panel: No results for input(s): VITAMINB12, FOLATE, FERRITIN, TIBC, IRON, RETICCTPCT in the last 72 hours. Sepsis Labs: Recent Labs  Lab 05/23/21 1313 05/24/21 0419 05/25/21 0456  PROCALCITON 0.11 <0.10 <0.10    No results found for this or any previous visit (from the past 240 hour(s)).   Radiology Studies: No results found.   LOS: 11 days   Lanae Boast, MD [facility]  05/29/2021, 2:14 PM

## 2021-05-29 NOTE — TOC Progression Note (Signed)
Transition of Care [facility]) - Progression Note    Patient Details  Name: Justin Richmond MRN: [id number] Date of Birth: 1924-01-17  Transition of [facility]) CM/SW Contact  Endiya Klahr, Olegario Messier, RN Phone Number: 05/29/2021, 12:25 PM  Clinical Narrative: [place] chose [facility] Rosey Bath aware. MD notified to order covid.       Expected Discharge Plan: Skilled Nursing Facility Barriers to Discharge: Continued Medical Work up  Expected Discharge Plan and Services Expected Discharge Plan: Skilled Nursing Facility   Discharge Planning Services: CM Consult Post Acute Care Choice: Home Health Living arrangements for the past 2 months: Single Family Home                           HH Arranged: PT HH Agency[facility] Date [facility] Agency Contacted: 05/22/21 Time HH Agency Contacted: [phone] Representative spoke with at [facility] Agency: Misty Stanley   Social Determinants of Health (SDOH) Interventions    Readmission Risk Interventions No flowsheet data found.

## 2021-05-29 NOTE — Progress Notes (Signed)
Physical Therapy Treatment Patient Details Name: Justin Richmond MRN: [id number] DOB: [birth year] Today's Date: 05/29/2021    History of Present Illness 87 yo male admitted with Pna, sepsis. Hx of HB, PPM, CHF, OA, recurrent UTI, chronic pain, scoliosis, L knee fusion/leg length discrepancy, spinal stenosis, ILD    PT Comments    Assisted pt OOB to standing at bedside and then to [facility]. Pt continues to be limited by fatigue and feelings of dizziness. Pt is deconditioned from inpatient course. General transfer comments; pt required min assist sit/stand from elevated EOB. He was able to stand and march for 1 minute but stopped with complaints of fatigue and noted SOB. Ambulation deferred due to decreased standing endurance and complaints of dizziness. Pt transferred to [facility] and then back to bed with standing pivot transfer with RW and min assist. Pt positioned to comfort.    Follow Up Recommendations  SNF     Equipment Recommendations  None recommended by PT    Recommendations for Other Services       Precautions / Restrictions Precautions Precautions: Fall Precaution Comments: monitor O2 Restrictions Weight Bearing Restrictions: No    Mobility  Bed Mobility Overal bed mobility: Needs Assistance Bed Mobility: Supine to Sit;Sit to Supine     Supine to sit: Min assist Sit to supine: Min assist   General bed mobility comments: pt required min assist to transistion to EOB but continues to require increased time. Some c/o of dizziness at EOB.    Transfers Overall transfer level: Needs assistance Equipment used: Rolling walker (2 wheeled) Transfers: Stand Pivot Transfers;Sit to/from Stand Sit to Stand: Min assist;+2 safety/equipment Stand pivot transfers: Mod assist;Min assist       General transfer comment: applied personal shoes (one has a lift insert) and assisted to standing for orthostatic BP reading with min assist +2 for safety and RW. VC's on proper push off. able to  stand for 3 min orthostatic reading.  Ambulation/Gait             General Gait Details: unable to ambulate due to continued hypotension and max c/o of fatigue upon standing   Stairs             Wheelchair Mobility    Modified Rankin (Stroke Patients Only)       Balance Overall balance assessment: Needs assistance         Standing balance support: Bilateral upper extremity supported Standing balance-Leahy Scale: Poor                              Cognition Arousal/Alertness: Awake/alert Behavior During Therapy: WFL for tasks assessed/performed Overall Cognitive Status: Within Functional Limits for tasks assessed                                        Exercises      General Comments        Pertinent Vitals/Pain Pain Assessment: No/denies pain    Home Living                      Prior Function            PT Goals (current goals can now be found in the care plan section) Acute Rehab PT Goals Patient Stated Goal: get better. get home. PT Goal Formulation: With patient Time For Goal Achievement:  06/04/21 Potential to Achieve Goals: Good    Frequency    Min 3X/week      PT Plan Current plan remains appropriate    Co-evaluation              AM-PAC PT "6 Clicks" Mobility   Outcome Measure  Help needed turning from your back to your side while in a flat bed without using bedrails?: A Lot Help needed moving from lying on your back to sitting on the side of a flat bed without using bedrails?: A Lot Help needed moving to and from a bed to a chair (including a wheelchair)?: A Lot Help needed standing up from a chair using your arms (e.g., wheelchair or bedside chair)?: A Lot Help needed to walk in hospital room?: A Lot Help needed climbing 3-5 steps with a railing? : Total 6 Click Score: 11    End of Session Equipment Utilized During Treatment: Gait belt;Oxygen Activity Tolerance: Patient limited  by fatigue;Other (comment) Patient left: in bed;with call bell/phone within reach;with bed alarm set Nurse Communication: Mobility status PT Visit Diagnosis: Muscle weakness (generalized) (M62.81);Difficulty in walking, not elsewhere classified (R26.2)     Time:  -     Charges:                       Alma Friendly, PTA Student  Acute Rehabilitation Services Pager : [phone] Office : [phone]    Alma Friendly 05/29/2021, 1:26 PM

## 2021-05-30 DIAGNOSIS — I951 Orthostatic hypotension: Secondary | ICD-10-CM

## 2021-05-30 DIAGNOSIS — A419 Sepsis, unspecified organism: Secondary | ICD-10-CM | POA: Diagnosis not present

## 2021-05-30 DIAGNOSIS — R6520 Severe sepsis without septic shock: Secondary | ICD-10-CM | POA: Diagnosis not present

## 2021-05-30 LAB — SARS CORONAVIRUS 2 (TAT 6-24 HRS): SARS Coronavirus 2: NEGATIVE

## 2021-05-30 MED ORDER — MIDODRINE HCL 5 MG PO TABS
10.0000 mg | ORAL_TABLET | Freq: Three times a day (TID) | ORAL | Status: DC
  Administered 2021-05-31 (×3): 10 mg via ORAL
  Filled 2021-05-30 (×3): qty 2

## 2021-05-30 NOTE — Consult Note (Signed)
Cardiology Consultation:   Patient ID: Justin Richmond MRN: [id number]; DOB: [birth year]  Admit date: 05/17/2021 Date of Consult: 05/30/2021  PCP:  Johny Blamer, MD   [facility] Providers Cardiologist:  None   { Electrophysiologist: Sherryl Manges, MD   Patient Profile:   Justin Richmond is a 87 y.o. male with a hx of syncope, CHB w/p BSci PPM, spinal stenosis with b/l neuropathy, COVID 10/2020, ILD (seen by Dr Isaiah Serge), HTN, HLD, HOH, who is being seen 05/30/2021 for the evaluation of orthostatic hypotension after being given Lasix at the request of Dr Jonathon Bellows.  History of Present Illness:   [name] was seen by Dr. Graciela Husbands 03/21/2021.  He was complaining of weakness on standing and was afraid of falling.  His atrial lead had increased impedance and was changed to unipolar.  He was to consult with his PCP and consider physical therapy.  He was admitted on 05/17/2021 with low-grade fever, O2 saturation in the upper 70s, cough and shortness of breath.  He was diagnosed with pneumonia, COVID-negative.  He was volume overloaded and given Lasix twice.  He was noted to have orthostatic hypotension on 7/15.  Lasix was held.  He was started on midodrine.  He remained hypoxic and it was felt he would need to be discharged on home O2.  He completed antibiotics for the pneumonia.  Procalcitonin was negative and was weaned off steroids.  On 7/19, he was given a 500 cc bolus for the orthostatic hypotension.  He had on compression stockings and the midodrine was increased.  He has been off carvedilol.  He is still orthostatic despite multiple fluid doses.  His cosyntropin test was negative.  Cardiology was asked to evaluate him.  Mr. Requena is not able to stand unaided.  His legs are extremely weak.  He does say that prior to admission, he was occasionally having problems with dizziness on standing, but had not passed out.  Now, he does feel lightheaded and dizzy when he stands up every  time.   Past Medical History:  Diagnosis Date   Arrhythmia    Balance problems    Chronic back pain    Chronic constipation    Congestive heart failure (HCC)    Diarrhea    Eczema    Eye disease    cataracts/glaucoma   GERD (gastroesophageal reflux disease)    Hearing loss    Hemorrhoids    High cholesterol    Hypertension    Irregular heartbeat    Joint pain    Joint stiffness    Migraine headache    Mouth sores    Muscle weakness    OAB (overactive bladder)    Osteoarthritis    Osteomyelitis (HCC)    childhood, left leg   Pacemaker    Poor circulation    Pure hypercholesterolemia    Rectal bleeding    Recurrent UTI    Scoliosis    Shortness of breath    Skin disorder    Spinal stenosis, lumbar region with neurogenic claudication     Past Surgical History:  Procedure Laterality Date   DENTAL SURGERY     tooth removal, root canals   INSERT / REPLACE / REMOVE PACEMAKER     osteomyelitis surgery     x10 surgeries   SPINAL INJECTION     x3      Home Medications:  Prior to Admission medications   Medication Sig Start Date End Date Taking? Authorizing Provider  aspirin EC 81 MG tablet  Take 81 mg by mouth in the morning and at bedtime.   Yes [provider]  Budeson-Glycopyrrol-Formoterol (BREZTRI AEROSPHERE) 160-9-4.8 MCG/ACT AERO Inhale 2 puffs into the lungs 2 (two) times daily. Patient taking differently: Inhale 2 puffs into the lungs 2 (two) times daily as needed (shortness of breath/wheezing). 02/25/21  Yes Mannam, Praveen, MD  carvedilol (COREG) 12.5 MG tablet Take 1 tablet (12.5 mg total) by mouth 2 (two) times daily. 03/21/21  Yes Duke Salvia, MD  Coenzyme Q10 (CO Q10) 100 MG CAPS Take 100 mg by mouth daily.   Yes [provider]  Cyanocobalamin (VITAMIN B-12) 5000 MCG SUBL Place 5,000 mcg under the tongue daily.   Yes [provider]  esomeprazole (NEXIUM) 20 MG capsule Take 20 mg by mouth daily at 12 noon.   Yes [provider]  gabapentin (NEURONTIN) 100 MG capsule Take 100 mg by mouth at bedtime. 08/14/20  Yes [provider]  Garlic 500 MG CAPS Take 500 mg by mouth daily.   Yes [provider]  ibuprofen (ADVIL) 200 MG tablet Take 400 mg by mouth every 6 (six) hours as needed for fever, headache or mild pain.   Yes [provider]  Magnesium Malate 1250 (141.7 Mg) MG TABS Take 1,250 mg by mouth at bedtime.   Yes [provider]  oxybutynin (DITROPAN) 5 MG tablet Take 5 mg by mouth in the morning and at bedtime. 03/15/21  Yes [provider]  Probiotic Product (DIGESTIVE ADV DIGESTIVE/IMMUNE) CAPS Take 1 capsule by mouth daily.   Yes [provider]  rOPINIRole (REQUIP) 0.25 MG tablet Take 0.25 mg by mouth at bedtime. 02/19/21  Yes [provider]  rosuvastatin (CRESTOR) 10 MG tablet Take 10 mg by mouth daily. 03/05/20  Yes [provider]  Turmeric 450 MG CAPS Take 450 mg by mouth daily.   Yes [provider]  Vitamin A 7.5 MG (25000 UT) CAPS Take 25,000 Units by mouth daily.   Yes [provider]  Vitamin D-Vitamin K (VITAMIN K2-VITAMIN D3 PO) Take 1 capsule by mouth daily.   Yes [provider]    Inpatient Medications: Scheduled Meds:  arformoterol  15 mcg Nebulization BID   And   umeclidinium bromide  1 puff Inhalation Daily   And   budesonide (PULMICORT) nebulizer solution  1 mg Nebulization BID   aspirin EC  81 mg Oral Daily   docusate sodium  100 mg Oral BID   enoxaparin (LOVENOX) injection  40 mg Subcutaneous Q24H   feeding supplement  237 mL Oral BID BM   gabapentin  100 mg Oral QHS   hydrocortisone  25 mg Rectal BID   magnesium gluconate  500 mg Oral QHS   midodrine  5 mg Oral TID WC   oxybutynin  5 mg Oral BID   pantoprazole  20 mg Oral Daily   polyethylene glycol  17 g Oral Daily   rOPINIRole  0.25 mg Oral QHS   rosuvastatin  10 mg Oral QHS   vitamin B-12  5,000 mcg Oral Daily    Continuous Infusions:  sodium chloride 50 mL/hr at 05/29/21 1721   PRN Meds: acetaminophen **OR** acetaminophen, albuterol, lip balm, witch hazel-glycerin  Allergies:    Allergies  Allergen Reactions   Penicillins     Unknown reaction, "it's been a long time".    Social History:   Social History   Socioeconomic History   Marital status: Widowed    Spouse name:  Not on file   Number of children: 4   Years of education: Not on file   Highest education level: Professional school degree (e.g., MD, DDS, DVM, JD)  Occupational History   Not on file  Tobacco Use   Smoking status: Former    Packs/day: 0.25    Years: 2.00    Pack years: 0.50    Types: Cigarettes    Quit date: 05/16/1950    Years since quitting: 71.0   Smokeless tobacco: Never  Vaping Use   Vaping Use: Never used  Substance and Sexual Activity   Alcohol use: Never    Comment: in college   Drug use: Never    Comment: no   Sexual activity: Not Currently  Other Topics Concern   Not on file  Social History Narrative   Lives with daughter   Right handed   Caffeine: 1 cup/day   Social Determinants of Health   Financial Resource Strain: Not on file  Food Insecurity: Not on file  Transportation Needs: Not on file  Physical Activity: Not on file  Stress: Not on file  Social Connections: Not on file  Intimate Partner Violence: Not on file    Family History:   Family History  Problem Relation Age of Onset   Stroke Mother    Other Father        "black lung disease"   Cancer Sister    Ankylosing spondylitis Brother    Asthma Son      ROS:  Please see the history of present illness.  All other ROS reviewed and negative.     Physical Exam/Data:   Vitals:   05/30/21 0417 05/30/21 0834 05/30/21 1106 05/30/21 1142  BP: 138/76  139/76   Pulse: 89  (!) 104   Resp: 16  20 (!) 23  Temp: 97.9 F (36.6 C)  98.3 F (36.8 C)   TempSrc: Oral  Oral   SpO2: 96% 95% 96%   Weight: 57.2 kg     Height:         Intake/Output Summary (Last 24 hours) at 05/30/2021 1152 Last data filed at 05/30/2021 0837 Gross per 24 hour  Intake 1408.12 ml  Output 1275 ml  Net 133.12 ml   Last 3 Weights 05/30/2021 05/29/2021 05/28/2021  Weight (lbs) 126 lb 1.7 oz 132 lb 7.9 oz 128 lb 4.9 oz  Weight (kg) 57.2 kg 60.1 kg 58.2 kg     Orthostatic VS for the past 24 hrs (Last 3 readings):  BP- Lying Pulse- Lying BP- Sitting Pulse- Sitting BP- Standing at 0 minutes Pulse- Standing at 0 minutes BP- Standing at 3 minutes Pulse- Standing at 3 minutes  05/29/21 1500 (!) 157/93 89 122/73 107 (!) 88/69 114 95/65 113  05/29/21 1319 141/87 88 136/79 92 107/65 118 122/75 110     Body mass index is 19.17 kg/m.  General:  Well nourished, well developed, elderly male, in no acute distress HEENT: normal Lymph: no adenopathy Neck: JVD 8-9 cm Endocrine:  No thryomegaly Vascular: No carotid bruits; 4/4 extremity pulses 2+ bilaterally  Cardiac:  normal S1, S2; RRR; no murmur  Lungs: Rales and some rhonchi bilaterally, no wheezing  Abd: soft, nontender, no hepatomegaly  Ext: no edema Musculoskeletal:  No deformities, BUE and BLE strength very weak but equal Skin: warm and dry  Neuro:  CNs 2-12 intact, no focal abnormalities noted Psych:  Normal affect   EKG:  The EKG was personally reviewed and demonstrates: Sinus tachycardia with ventricular  pacing and occasional atrial pacing, heart rate 105, QRS duration 163 ms (similar to previous readings) Telemetry:  Telemetry was personally reviewed and demonstrates: Sinus rhythm V pacing.  Some episodes of abnormal pacing spikes are observed, this may all be artifact  Relevant CV Studies:  ECHO: 05/18/2021  1. Left ventricular ejection fraction, by estimation, is 50 to 55%. The left ventricle has low normal function. The left ventricle has no regional wall motion abnormalities. Left ventricular diastolic parameters are consistent with Grade I diastolic dysfunction (impaired  relaxation).   2. Right ventricular systolic function is normal. The right ventricular size is normal. There is mildly elevated pulmonary artery systolic pressure.  RVSP 38  3. The mitral valve is normal in structure. Mild mitral valve  regurgitation.   4. The aortic valve is tricuspid. Aortic valve regurgitation is trivial.  Peak gradient 11.6 mmHg  5. The inferior vena cava is normal in size with greater than 50%  respiratory variability, suggesting right atrial pressure of 3 mmHg.   Laboratory Data:  High Sensitivity Troponin:  No results for input(s): TROPONINIHS in the last 720 hours.    Chemistry Recent Labs  Lab 05/27/21 1122  NA 138  K 3.7  CL 95*  CO2 33*  GLUCOSE 161*  BUN 35*  CREATININE 0.88  CALCIUM 8.8*  GFRNONAA >60  ANIONGAP 10    No results for input(s): PROT, ALBUMIN, AST, ALT, ALKPHOS, BILITOT in the last 168 hours. Hematology Recent Labs  Lab 05/23/21 1156 05/24/21 0419 05/25/21 0456  WBC 19.1* 13.4* 12.9*  RBC 3.91* 3.84* 3.84*  HGB 11.7* 11.9* 11.6*  HCT 37.0* 36.7* 37.2*  MCV 94.6 95.6 96.9  MCH 29.9 31.0 30.2  MCHC 31.6 32.4 31.2  RDW 13.8 13.9 13.9  PLT 263 286 313   BNP    Component Value Date/Time   BNP 407.9 (H) 05/21/2021 0424     Radiology/Studies:  DG Chest 2 View  Result Date: 05/17/2021 CLINICAL DATA:  Pt arrived via EMS from home with [facility]. Pt has wet cough. Pmhx of pacemaker and urinary retention. Pt states he has had urinary retention for 3 days. Pt denies chest pain, and has a hx of HTN, and CHF. EXAM: CHEST - 2 VIEW COMPARISON:  Chest x-ray 03/10/2021, CT chest 03/14/2021 FINDINGS: The heart size and mediastinal contours are unchanged. Aortic calcification. Left chest wall dual lead cardiac pacemaker in similar position. Biapical pleural/pulmonary scarring. Interval increase in diffuse patchy airspace opacities. Similar appearing coarsened interstitial markings. Possible trace left pleural effusion. No right pleural effusion.  No pneumothorax. No acute osseous abnormality. IMPRESSION: 1. Chronic interstitial lung disease with superimposed infection/inflammation. 2. Possible trace left pleural effusion Electronically Signed   By: Tish Frederickson M.D.   On: 05/17/2021 23:19   DG Chest Port 1 View  Result Date: 05/24/2021 CLINICAL DATA:  CHF. EXAM: PORTABLE CHEST 1 VIEW COMPARISON:  05/19/2021. FINDINGS: Chronic coarsened interstitial markings with improved superimposed patchy bilateral airspace opacities. Chronic interstitial lung disease was better characterized on prior CT chest from Mar 14, 2021. No visible pleural effusions or pneumothorax on this single semi erect AP radiograph. Cardiac silhouette is largely obscured. Aortic atherosclerosis. Left subclavian approach dual lead cardiac rhythm maintenance device in similar position. Polyarticular degenerative change. IMPRESSION: Findings suggestive of improving multifocal pneumonia and/or edema superimposed on chronic interstitial lung disease. Electronically Signed   By: Feliberto Harts MD   On: 05/24/2021 07:48   DG CHEST PORT 1 VIEW  Result Date: 05/19/2021 CLINICAL DATA:  Shortness of breath, cough, fever and chills. EXAM: PORTABLE CHEST 1 VIEW COMPARISON:  Chest x-rays dated 05/17/2021 and 03/10/2021. FINDINGS: Ill-defined airspace opacities bilaterally, likely superimposed on chronic interstitial lung disease. No pleural effusion or pneumothorax is seen. Heart size and mediastinal contours are grossly stable. LEFT chest wall pacemaker/ICD hardware in place. IMPRESSION: Multifocal pneumonia superimposed on chronic interstitial lung disease, not significantly changed compared to chest x-ray of 05/17/2021. Electronically Signed   By: Bary Richard M.D.   On: 05/19/2021 08:34   ECHOCARDIOGRAM COMPLETE  Result Date: 05/18/2021    ECHOCARDIOGRAM REPORT   Patient Name:   LAMBROS CERRO Date of Exam: 05/18/2021 Medical Rec #:  [id number]        Height:       65.0 in Accession #:     [id number]       Weight:       133.6 lb Date of Birth:  August 07, 1924        BSA:          1.666 m Patient Age:    89 years         BP:           147/84 mmHg Patient Gender: M                HR:           73 bpm. Exam Location:  Inpatient Procedure: 2D Echo, Cardiac Doppler and Color Doppler Indications:    CHF  History:        Patient has prior history of Echocardiogram examinations, most                 recent 06/22/2018. CHF, Pacemaker, Signs/Symptoms:Shortness of                 Breath; Risk Factors:Dyslipidemia. 06/01/2017 pacemaker.  Sonographer:    Neomia Dear RDCS Referring Phys: [id number] VASUNDHRA RATHORE IMPRESSIONS  1. Left ventricular ejection fraction, by estimation, is 50 to 55%. The left ventricle has low normal function. The left ventricle has no regional wall motion abnormalities. Left ventricular diastolic parameters are consistent with Grade I diastolic dysfunction (impaired relaxation).  2. Right ventricular systolic function is normal. The right ventricular size is normal. There is mildly elevated pulmonary artery systolic pressure.  3. The mitral valve is normal in structure. Mild mitral valve regurgitation.  4. The aortic valve is tricuspid. Aortic valve regurgitation is trivial.  5. The inferior vena cava is normal in size with greater than 50% respiratory variability, suggesting right atrial pressure of 3 mmHg. FINDINGS  Left Ventricle: There is substantial septal-lateral and apex-to-base dyssynchrony. Left ventricular ejection fraction, by estimation, is 50 to 55%. The left ventricle has low normal function. The left ventricle has no regional wall motion abnormalities.  The left ventricular internal cavity size was normal in size. There is no left ventricular hypertrophy. Abnormal (paradoxical) septal motion, consistent with RV pacemaker. Left ventricular diastolic parameters are consistent with Grade I diastolic dysfunction (impaired relaxation). Normal left ventricular filling pressure.  Right Ventricle: The right ventricular size is normal. No increase in right ventricular wall thickness. Right ventricular systolic function is normal. There is mildly elevated pulmonary artery systolic pressure. The tricuspid regurgitant velocity is 2.96  m/s, and with an assumed right atrial pressure of 3 mmHg, the estimated right ventricular systolic pressure is 38.0 mmHg. Left Atrium: Left atrial size was normal in size. Right Atrium: Right atrial size was normal in size. Pericardium: There is no evidence of  pericardial effusion. Mitral Valve: The mitral valve is normal in structure. Mild mitral valve regurgitation. Tricuspid Valve: The tricuspid valve is normal in structure. Tricuspid valve regurgitation is mild. Aortic Valve: The aortic valve is tricuspid. Aortic valve regurgitation is trivial. Aortic regurgitation PHT measures 479 msec. Aortic valve mean gradient measures 6.0 mmHg. Aortic valve peak gradient measures 11.6 mmHg. Aortic valve area, by VTI measures 2.39 cm. Pulmonic Valve: The pulmonic valve was not well visualized. Pulmonic valve regurgitation is not visualized. Aorta: The aortic root and ascending aorta are structurally normal, with no evidence of dilitation. Venous: The inferior vena cava is normal in size with greater than 50% respiratory variability, suggesting right atrial pressure of 3 mmHg. IAS/Shunts: No atrial level shunt detected by color flow Doppler. Additional Comments: A device lead is visualized in the right ventricle.  LEFT VENTRICLE PLAX 2D LVIDd:         4.30 cm     Diastology LVIDs:         4.00 cm     LV e' medial:    3.70 cm/s LV PW:         1.20 cm     LV E/e' medial:  12.4 LV IVS:        1.10 cm     LV e' lateral:   15.90 cm/s LVOT diam:     2.10 cm     LV E/e' lateral: 2.9 LV SV:         76 LV SV Index:   46 LVOT Area:     3.46 cm  LV Volumes (MOD) LV vol d, MOD A2C: 83.6 ml LV vol d, MOD A4C: 51.1 ml LV vol s, MOD A2C: 47.6 ml LV vol s, MOD A4C: 35.2 ml LV SV MOD A2C:      36.0 ml LV SV MOD A4C:     51.1 ml LV SV MOD BP:      23.9 ml RIGHT VENTRICLE RV Basal diam:  3.50 cm RV Mid diam:    2.50 cm RV S prime:     16.20 cm/s TAPSE (M-mode): 1.7 cm LEFT ATRIUM           Index       RIGHT ATRIUM           Index LA Vol (A2C): 29.3 ml 17.58 ml/m RA Area:     14.80 cm LA Vol (A4C): 28.0 ml 16.80 ml/m RA Volume:   33.10 ml  19.86 ml/m  AORTIC VALVE                    PULMONIC VALVE AV Area (Vmax):    2.26 cm     PV Vmax:       0.67 m/s AV Area (Vmean):   2.24 cm     PV Vmean:      47.700 cm/s AV Area (VTI):     2.39 cm     PV VTI:        0.147 m AV Vmax:           170.00 cm/s  PV Peak grad:  1.8 mmHg AV Vmean:          114.000 cm/s PV Mean grad:  1.0 mmHg AV VTI:            0.317 m AV Peak Grad:      11.6 mmHg AV Mean Grad:      6.0 mmHg LVOT Vmax:  111.00 cm/s LVOT Vmean:        73.800 cm/s LVOT VTI:          0.219 m LVOT/AV VTI ratio: 0.69 AI PHT:            479 msec  AORTA Ao Root diam: 3.40 cm Ao Asc diam:  3.20 cm MITRAL VALVE                TRICUSPID VALVE MV Area (PHT): 4.96 cm     TR Peak grad:   35.0 mmHg MV Decel Time: 153 msec     TR Vmax:        296.00 cm/s MR Peak grad: 86.1 mmHg MR Vmax:      464.00 cm/s   SHUNTS MV E velocity: 46.00 cm/s   Systemic VTI:  0.22 m MV A velocity: 110.00 cm/s  Systemic Diam: 2.10 cm MV E/A ratio:  0.42 Mihai Croitoru MD Electronically signed by Thurmon Fair MD Signature Date/Time: 05/18/2021/1:51:29 PM    Final    CUP PACEART REMOTE DEVICE CHECK  Result Date: 05/02/2021 Scheduled remote reviewed. Normal device function.  ATR episodes EGMs continue to show brief bursts of noise on RA channel. Next remote 91 days. LH    BP- Lying  141/87 157/93    BP- Lying     Pulse- Lying   88 89   Pulse- Lying    BP- Sitting   136/79 122/73   BP- Sitting    Pulse- Sitting   92 107   Pulse- Sitting    BP- Standing at 0 minutes   107/65 88/69   BP- Standing at 0 minutes    Pulse- Standing at 0 minutes   118 114   Pulse- Standing at 0  minutes    BP- Standing at 3 minutes   122/75 95/65   BP- Standing at 3 minutes    Pulse- Standing at 3 minutes   110 113         Assessment and Plan:   Orthostatic hypotension: -His midodrine was increased from 2.5 mg 3 times daily to 5 mg 3 times daily on 7/19 - He is wearing compression stockings. -He does not think he was having the symptoms before he came in the hospital - He is severely weakened.  It is unclear of the contribution of his spinal stenosis and leg weakness to his orthostatic hypotension - He is not on any medications known to cause orthostatic hypotension -She has not had any Lasix since 7/14 -Discuss with MD sleeping on a slanted bed and adding an abdominal binder -Do not think he needs any more fluid  2.  Chronic diastolic CHF: - Weight on admission was 60.2 kg and he is now 57.2 kg - He was given Lasix 40 mg on 7/9, Lasix 20 mg on 7/12 and 7/14 -he is not on a standing diuretic dose - He has mild volume overload on exam, but with orthostatic hypotension, will not give Lasix at this time -Continue to follow daily weights   Risk Assessment/Risk Scores:        New York Heart Association (NYHA) Functional Class NYHA Class III        For questions or updates, please contact [facility] Please consult [email or web address] for contact info under    Signed, Theodore Demark, PA-C  05/30/2021 11:52 AM

## 2021-05-30 NOTE — TOC Progression Note (Addendum)
Transition of Care [facility]) - Progression Note    Patient Details  Name: Armistead Sult MRN: [id number] Date of Birth: 01/26/1924  [facility]) CM/SW Contact  Heidemarie Goodnow, Olegario Messier, RN Phone Number: 05/30/2021, 10:52 AM  Clinical Narrative: dtr chose [facility] for SN[facility]-rep clive will visit patient today 1-1:30p-dtr in agreement-will await outcome. Covid results neg 7/20.  2p-[facility] rep Malena [facility] accepted. Anticipate d/c in am.     Expected Discharge Plan: Skilled Nursing Facility Barriers to Discharge: Continued Medical Work up  Expected Discharge Plan and Services Expected Discharge Plan: Skilled Nursing Facility   Discharge Planning Services: CM Consult Post Acute Care Choice: Home Health Living arrangements for the past 2 months: Single Family Home                           HH Arranged: PT HH Agency: [facility] Date [facility] Agency Contacted: 05/22/21 Time HH Agency Contacted: [id number] Representative spoke with at [facility] Agency: Misty Stanley   Social Determinants of Health (SDOH) Interventions    Readmission Risk Interventions No flowsheet data found.

## 2021-05-30 NOTE — Progress Notes (Signed)
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  He was seen in the ED and admitted for severe sepsis due to community-acquired pneumonia acute hypoxic respiratory failure acute on chronic diastolic CHF. Patient treated with iv antibiotics   Also managed with IV Lasix supplemental oxygen. Seen by PT OT initially home health PT was advised.  Subsequently patient was dizzy with walking and weak advised skilled nursing facility .  Subjective: Seen this morning feels well.  He has been orthostatic and remains on IV fluid hydration.  Orthostatic vitals pending this morning-asked the nursing staff to obtain one  Assessment & Plan:  Severe sepsis POA due to pneumonia/Community-acquired pneumonia Completed ceftriaxone/azithromycin.he has been afebrile, overall stable.  Had leukocytosis but with a steroid, procalcitonin has been negative, off steroid. Recent Labs  Lab 05/23/21 1313 05/24/21 0419 05/25/21 0456  WBC  --  13.4* 12.9*  PROCALCITON 0.11 <0.10 <0.10   Acute hypoxic aspiratory failure due to #1 continue supplemental oxygen maintain at 2 L also for saturation of 92%-wean as tolerated.  Likely to go home on oxygen given his ILD and CHF.   Acute on chronic diastolic WJX:BJYNWGNCHF:needing IV Lasix in the ED and x2 as inpatient.  Now he has been needing IV fluid for his orthostatic vitals.  He is off Lasix of Coreg.   Filed Weights   05/28/21 0700 05/29/21 0500 05/30/21 0417  Weight: 58.2 kg 60.1 kg 57.2 kg    Intake/Output Summary (Last 24 hours) at 05/30/2021 1304 Last data filed at  05/30/2021 [id number] Gross per 24 hour  Intake 1408.12 ml  Output 1275 ml  Net 133.12 ml   Orthostatic hypotension: Patient was given fluid multiple times as boluses and now on 50 mill per hour since yesterday, on monitoring program 3 times daily.  Cosyntropin test was normal.  Off his carvedilol.  Given his persistent orthostatic issues requesting cardiology consultation.  Hypertension: 2/2 orthostatic episodes Coreg has been discontinued. On last visit W/ CARDIOLOGY he was upped to 12.5 mg and since then BP has been fluctuating.     Constipation: Continue as needed laxatives  Generalized weakness: PT OT, continue to address orthostatic hypotension.  SNF planned  HYQ:MVHQIONGLD:continue his inhalers,IS,supplemental oxygen.Patient has crackles posteriorly.  Hyperlipidemia: cont  statin  Complete heart block with pacemaker in place.stable.  Hemorrhoid: Cont Anusol  Diet Order             Diet Heart Room service appropriate? Yes; Fluid consistency: Thin; Fluid restriction: 1200 mL Fluid  Diet effective now                   Nutrition Problem: Increased nutrient needs Etiology: acute illness Signs/Symptoms: estimated needs Interventions: Ensure Enlive (each supplement provides 350kcal and 20 grams of protein) Patient's Body mass index is 19.17 kg/m.  Pressure Injury 05/18/21 Coccyx Mid Stage 1 -  Intact skin with non-blanchable redness of a localized area usually over a bony prominence. (Active)  05/18/21 1700  Location: Coccyx  Location Orientation: Mid  Staging: Stage 1 -  Intact skin with non-blanchable redness of a localized area usually  over a bony prominence.  Wound Description (Comments):   Present on Admission: Yes     Pressure Injury 05/18/21 Buttocks Left Stage 2 -  Partial thickness loss of dermis presenting as a shallow open injury with a red, pink wound bed without slough. (Active)  05/18/21 1700  Location: Buttocks  Location Orientation: Left  Staging: Stage 2 -  Partial  thickness loss of dermis presenting as a shallow open injury with a red, pink wound bed without slough.  Wound Description (Comments):   Present on Admission: Yes   DVT prophylaxis: enoxaparin (LOVENOX) injection 40 mg Start: 05/18/21 1000 Code Status:   Code Status: DNR  Family Communication: plan of care discussed with patient at bedside and I had updated daughter.  Status is: Inpatient Remains inpatient appropriate because:Inpatient level of care appropriate due to severity of illness Dispo: The patient is from: Home              Anticipated d/c is to: SNF once orthostatic vitals improves and bed available. Hopefully 1-2 days              Patient currently NOT medically stable for discharge.   Difficult to place patient No  Unresulted Labs (From admission, onward)    None      Medications reviewed:  Scheduled Meds:  arformoterol  15 mcg Nebulization BID   And   umeclidinium bromide  1 puff Inhalation Daily   And   budesonide (PULMICORT) nebulizer solution  1 mg Nebulization BID   aspirin EC  81 mg Oral Daily   docusate sodium  100 mg Oral BID   enoxaparin (LOVENOX) injection  40 mg Subcutaneous Q24H   feeding supplement  237 mL Oral BID BM   gabapentin  100 mg Oral QHS   hydrocortisone  25 mg Rectal BID   magnesium gluconate  500 mg Oral QHS   midodrine  5 mg Oral TID WC   oxybutynin  5 mg Oral BID   pantoprazole  20 mg Oral Daily   polyethylene glycol  17 g Oral Daily   rOPINIRole  0.25 mg Oral QHS   rosuvastatin  10 mg Oral QHS   vitamin B-12  5,000 mcg Oral Daily   Continuous Infusions:  sodium chloride 50 mL/hr at 05/29/21 1721     Consultants:see note  Procedures:see note Antimicrobials: Anti-infectives (From admission, onward)    Start     Dose/Rate Route Frequency Ordered Stop   05/19/21 0100  cefTRIAXone (ROCEPHIN) 1 g in sodium chloride 0.9 % 100 mL IVPB        1 g 200 mL/hr over 30 Minutes Intravenous Every 24 hours 05/18/21 0341 05/24/21 0057    05/18/21 0400  azithromycin (ZITHROMAX) 500 mg in sodium chloride 0.9 % 250 mL IVPB  Status:  Discontinued        500 mg 250 mL/hr over 60 Minutes Intravenous Every 24 hours 05/18/21 0341 05/23/21 0716   05/17/21 2345  cefTRIAXone (ROCEPHIN) 1 g in sodium chloride 0.9 % 100 mL IVPB        1 g 200 mL/hr over 30 Minutes Intravenous  Once 05/17/21 2333 05/18/21 0116      Culture/Microbiology    Component Value Date/Time   SDES  05/18/2021 0541    URINE, RANDOM Performed at [facility], [address] [place], [place] [address]    SPECREQUEST  05/18/2021 0541    NONE Performed at [facility], [address] [place], [place]  [address]    CULT (A) 05/18/2021 0541    <10,000 COLONIES/mL INSIGNIFICANT GROWTH Performed at [facility], [address] [place], [place] [address]    REPTSTATUS 05/19/2021 FINAL 05/18/2021 0541    Other culture-see note  Objective: Vitals: Today's Vitals   05/30/21 0834 05/30/21 0900 05/30/21 1106 05/30/21 1142  BP:   139/76   Pulse:   (!) 104   Resp:   20 (!) 23  Temp:   98.3 F (36.8 C)   TempSrc:   Oral   SpO2: 95%  96%   Weight:      Height:      PainSc:  0-No pain  3     Intake/Output Summary (Last 24 hours) at 05/30/2021 1304 Last data filed at 05/30/2021 0837 Gross per 24 hour  Intake 1408.12 ml  Output 1275 ml  Net 133.12 ml    Filed Weights   05/28/21 0700 05/29/21 0500 05/30/21 0417  Weight: 58.2 kg 60.1 kg 57.2 kg   Weight change: -2.9 kg  Intake/Output from previous day: 07/20 0701 - 07/21 0700 In: 1408.1 [P.O.:1077; I.V.:331.1] Out: 1225 [Urine:1225] Intake/Output this shift: Total I/O In: 240 [P.O.:240] Out: 500 [Urine:500] Filed Weights   05/28/21 0700 05/29/21 0500 05/30/21 0417  Weight: 58.2 kg 60.1 kg 57.2 kg   Examination: General exam: AAOx3, weak, frail elderly gentleman on nasal cannula oxygen.  Not in acute distress. HEENT:Oral mucosa moist, Ear/Nose WNL  grossly, dentition normal. Respiratory system: bilaterally basal crackles, no use of accessory muscle Cardiovascular system: S1 & S2 +, No JVD,. Gastrointestinal system: Abdomen soft,NT,ND, BS+ Nervous System:Alert, awake, moving extremities and grossly nonfocal Extremities: no edema, distal peripheral pulses palpable.  Skin: No rashes,no icterus. MSK: Normal muscle bulk,tone, power    Data Reviewed: I have personally reviewed following labs and imaging studies CBC: Recent Labs  Lab 05/24/21 0419 05/25/21 0456  WBC 13.4* 12.9*  HGB 11.9* 11.6*  HCT 36.7* 37.2*  MCV 95.6 96.9  PLT 286 313   Basic Metabolic Panel: Recent Labs  Lab 05/27/21 1122  NA 138  K 3.7  CL 95*  CO2 33*  GLUCOSE 161*  BUN 35*  CREATININE 0.88  CALCIUM 8.8*   GFR: Estimated Creatinine Clearance: 41.5 mL/min (by C-G formula based on SCr of 0.88 mg/dL). Liver Function Tests: No results for input(s): AST, ALT, ALKPHOS, BILITOT, PROT, ALBUMIN in the last 168 hours.  No results for input(s): LIPASE, AMYLASE in the last 168 hours. No results for input(s): AMMONIA in the last 168 hours. Coagulation Profile: No results for input(s): INR, PROTIME in the last 168 hours.  Cardiac Enzymes: No results for input(s): CKTOTAL, CKMB, CKMBINDEX, TROPONINI in the last 168 hours. BNP (last 3 results) No results for input(s): PROBNP in the last 8760 hours. HbA1C: No results for input(s): HGBA1C in the last 72 hours. CBG: No results for input(s): GLUCAP in the last 168 hours. Lipid Profile: No results for input(s): CHOL, HDL, LDLCALC, TRIG, CHOLHDL, LDLDIRECT in the last 72 hours. Thyroid Function Tests: No results for input(s): TSH, T4TOTAL, FREET4, T3FREE, THYROIDAB in the last 72 hours. Anemia Panel: No results for input(s): VITAMINB12, FOLATE, FERRITIN, TIBC, IRON, RETICCTPCT in the last 72 hours. Sepsis Labs: Recent Labs  Lab 05/23/21 1313 05/24/21 0419 05/25/21 0456  PROCALCITON 0.11 <0.10 <0.10     Recent Results (from the past 240 hour(s))  SARS CORONAVIRUS 2 (TAT 6-24 HRS) Nasopharyngeal Nasopharyngeal Swab     Status: None   Collection Time: 05/29/21  5:25 PM   Specimen: Nasopharyngeal Swab  Result Value Ref Range Status   SARS Coronavirus 2 NEGATIVE NEGATIVE Final    Comment: (NOTE) SARS-CoV-2 target nucleic acids are NOT DETECTED.  The SARS-CoV-2 RNA is generally detectable in upper and lower respiratory specimens during the acute phase of infection. Negative results do not preclude SARS-CoV-2 infection, do not rule out co-infections with other pathogens, and should not be used as the sole basis for treatment or other patient management decisions. Negative results must be combined with clinical observations, patient history, and epidemiological information. The expected result is Negative.  Fact Sheet for Patients: HairSlick.no  Fact Sheet for Healthcare Providers: quierodirigir.com  This test is not yet approved or cleared by the [place] FDA and  has been authorized for detection and/or diagnosis of SARS-CoV-2 by FDA under an Emergency Use Authorization (EUA). This EUA will remain  in effect (meaning this test can be used) for the duration of the COVID-19 declaration under Se ction 564(b)(1) of the Act, 21 U.S.C. section 360bbb-3(b)(1), unless the authorization is terminated or revoked sooner.  Performed at [facility], [address], [place], [place] [address]      Radiology Studies: No results found.   LOS: 12 days   Lanae Boast, MD [facility]  05/30/2021, 1:04 PM

## 2021-05-31 DIAGNOSIS — I951 Orthostatic hypotension: Secondary | ICD-10-CM | POA: Diagnosis not present

## 2021-05-31 MED ORDER — SODIUM CHLORIDE 0.9 % IV SOLN: INTRAVENOUS | Status: DC

## 2021-05-31 NOTE — Progress Notes (Signed)
Physical Therapy Treatment Patient Details Name: Justin Richmond MRN: [id number] DOB: 08-26-1924 Today's Date: 05/31/2021    History of Present Illness 87 yo male admitted with Pna, sepsis. Hx of HB, PPM, CHF, OA, recurrent UTI, chronic pain, scoliosis, L knee fusion/leg length discrepancy, spinal stenosis, ILD    PT Comments    Pt A&O x3. Assisted pt OOB to standing. Unable to attempt ambulation today due to continued complaints of feeling dizzy and drop in BP. Pt required min assist to come to standing at EOB. Pt returned to supine  after standing for 30 seconds due to c/o dizziness. Pt positioned to comfort in bed. RN notified.    Follow Up Recommendations  SNF     Equipment Recommendations  None recommended by PT    Recommendations for Other Services       Precautions / Restrictions Precautions Precautions: Fall Precaution Comments: monitor O2 Restrictions Weight Bearing Restrictions: No    Mobility  Bed Mobility Overal bed mobility: Needs Assistance Bed Mobility: Supine to Sit;Sit to Supine     Supine to sit: Min assist Sit to supine: Min assist   General bed mobility comments: pt required min assist to transistion to EOB but continues to require increased time. Some c/o of dizziness at EOB.    Transfers Overall transfer level: Needs assistance Equipment used: Rolling walker (2 wheeled) Transfers: Sit to/from Stand Sit to Stand: Min assist;+2 safety/equipment         General transfer comment: applied personal shoes (one has a lift insert) and assisted to standing for orthostatic BP reading with min assist +2 for safety and RW. VC's on proper push off. unable to stand for 3 min orthostatic reading sue to c/o of dizziness.  Ambulation/Gait             General Gait Details: unable to ambulate due to continued hypotension and max c/o of fatigue upon standing   Stairs             Wheelchair Mobility    Modified Rankin (Stroke Patients Only)        Balance Overall balance assessment: Needs assistance         Standing balance support: Bilateral upper extremity supported Standing balance-Leahy Scale: Poor                              Cognition Arousal/Alertness: Awake/alert Behavior During Therapy: WFL for tasks assessed/performed Overall Cognitive Status: Within Functional Limits for tasks assessed                                        Exercises      General Comments        Pertinent Vitals/Pain Pain Assessment: Faces Faces Pain Scale: Hurts little more Pain Location: sacrum when supine Pain Descriptors / Indicators: Burning;Sore Pain Intervention(s): Limited activity within patient's tolerance;Monitored during session;Repositioned    Home Living                      Prior Function            PT Goals (current goals can now be found in the care plan section) Acute Rehab PT Goals Patient Stated Goal: get better. get home. PT Goal Formulation: With patient Time For Goal Achievement: 06/04/21 Potential to Achieve Goals: Good Progress towards PT goals: Progressing toward  goals    Frequency    Min 3X/week      PT Plan Current plan remains appropriate    Co-evaluation              AM-PAC PT "6 Clicks" Mobility   Outcome Measure  Help needed turning from your back to your side while in a flat bed without using bedrails?: A Lot Help needed moving from lying on your back to sitting on the side of a flat bed without using bedrails?: A Lot Help needed moving to and from a bed to a chair (including a wheelchair)?: A Lot Help needed standing up from a chair using your arms (e.g., wheelchair or bedside chair)?: A Lot Help needed to walk in hospital room?: A Lot Help needed climbing 3-5 steps with a railing? : Total 6 Click Score: 11    End of Session Equipment Utilized During Treatment: Gait belt;Oxygen Activity Tolerance: Patient limited by fatigue;Other  (comment) Patient left: in bed;with call bell/phone within reach;with bed alarm set Nurse Communication: Mobility status PT Visit Diagnosis: Muscle weakness (generalized) (M62.81);Difficulty in walking, not elsewhere classified (R26.2)     Time: 1700-1749 PT Time Calculation (min) (ACUTE ONLY): 20 min  Charges:  $Therapeutic Activity: 8-22 mins                     Alma Friendly, PTA Student  Acute Rehabilitation Services Pager : [phone] Office : [phone]    Alma Friendly 05/31/2021, 1:18 PM

## 2021-05-31 NOTE — Progress Notes (Signed)
Progress Note  Patient Name: Keymari Sato Date of Encounter: 05/31/2021  [facility] Cardiologist: None  Electrophysiologist: Sherryl Manges, MD   Subjective   Feels okay generally, but still got lightheaded today during orthostatic vital signs Still generally very weak, going to a facility after discharge  Inpatient Medications    Scheduled Meds:  arformoterol  15 mcg Nebulization BID   And   umeclidinium bromide  1 puff Inhalation Daily   And   budesonide (PULMICORT) nebulizer solution  1 mg Nebulization BID   aspirin EC  81 mg Oral Daily   docusate sodium  100 mg Oral BID   enoxaparin (LOVENOX) injection  40 mg Subcutaneous Q24H   feeding supplement  237 mL Oral BID BM   gabapentin  100 mg Oral QHS   hydrocortisone  25 mg Rectal BID   magnesium gluconate  500 mg Oral QHS   midodrine  10 mg Oral TID WC   oxybutynin  5 mg Oral BID   pantoprazole  20 mg Oral Daily   polyethylene glycol  17 g Oral Daily   rOPINIRole  0.25 mg Oral QHS   rosuvastatin  10 mg Oral QHS   vitamin B-12  5,000 mcg Oral Daily   Continuous Infusions:  sodium chloride 50 mL/hr at 05/30/21 1405   PRN Meds: acetaminophen **OR** acetaminophen, albuterol, lip balm, witch hazel-glycerin   Vital Signs    Vitals:   05/30/21 1524 05/30/21 1956 05/30/21 2111 05/31/21 0431  BP: (!) 157/73  (!) 155/87 (!) 155/95  Pulse: 90  96 85  Resp: 20  20 14   Temp: 98.1 F (36.7 C)  98.3 F (36.8 C) 98.1 F (36.7 C)  TempSrc: Oral  Oral Oral  SpO2: 97% 96% 97% 96%  Weight:    58.3 kg  Height:        Intake/Output Summary (Last 24 hours) at 05/31/2021 0745 Last data filed at 05/31/2021 0430 Gross per 24 hour  Intake 1317 ml  Output 1390 ml  Net -73 ml   Last 3 Weights 05/31/2021 05/30/2021 05/29/2021  Weight (lbs) 128 lb 8.5 oz 126 lb 1.7 oz 132 lb 7.9 oz  Weight (kg) 58.3 kg 57.2 kg 60.1 kg     Orthostatic VS for the past 24 hrs (Last 3 readings):  BP- Lying Pulse- Lying BP- Sitting Pulse-  Sitting BP- Standing at 0 minutes Pulse- Standing at 0 minutes BP- Standing at 3 minutes Pulse- Standing at 3 minutes  05/31/21 0834 133/76 87 118/74 96 (!) 89/44 103 100/69 108  05/31/21 0817 140/63 86 -- -- -- -- -- --  05/30/21 1524 157/73 -- 127/87 -- 117/57 -- 118/61 --     Telemetry    Sinus rhythm, ventricular pacing, PVCs- Personally Reviewed  ECG    None today- Personally Reviewed  Physical Exam   GEN: No acute distress.   Neck: JVD 8-9 cm, better seen on the right Cardiac: Irregular rate and rhythm , no murmurs, rubs, or gallops.  Respiratory: Rales bases bilaterally. GI: Soft, nontender, non-distended  MS: No edema; No deformity.  TED hose in place, but are loose and not very helpful Neuro:  Nonfocal  Psych: Normal affect   Labs    High Sensitivity Troponin:  No results for input(s): TROPONINIHS in the last 720 hours.    Chemistry Recent Labs  Lab 05/27/21 1122  NA 138  K 3.7  CL 95*  CO2 33*  GLUCOSE 161*  BUN 35*  CREATININE 0.88  CALCIUM  8.8*  GFRNONAA >60  ANIONGAP 10     Hematology Recent Labs  Lab 05/25/21 0456  WBC 12.9*  RBC 3.84*  HGB 11.6*  HCT 37.2*  MCV 96.9  MCH 30.2  MCHC 31.2  RDW 13.9  PLT 313    BNPNo results for input(s): BNP, PROBNP in the last 168 hours.   DDimer No results for input(s): DDIMER in the last 168 hours.   Radiology    DG Chest 2 View  Result Date: 05/17/2021 CLINICAL DATA:  Pt arrived via EMS from home with [facility]. Pt has wet cough. Pmhx of pacemaker and urinary retention. Pt states he has had urinary retention for 3 days. Pt denies chest pain, and has a hx of HTN, and CHF. EXAM: CHEST - 2 VIEW COMPARISON:  Chest x-ray 03/10/2021, CT chest 03/14/2021 FINDINGS: The heart size and mediastinal contours are unchanged. Aortic calcification. Left chest wall dual lead cardiac pacemaker in similar position. Biapical pleural/pulmonary scarring. Interval increase in diffuse patchy airspace opacities. Similar appearing  coarsened interstitial markings. Possible trace left pleural effusion. No right pleural effusion. No pneumothorax. No acute osseous abnormality. IMPRESSION: 1. Chronic interstitial lung disease with superimposed infection/inflammation. 2. Possible trace left pleural effusion Electronically Signed   By: Tish Frederickson M.D.   On: 05/17/2021 23:19   DG Chest Port 1 View  Result Date: 05/24/2021 CLINICAL DATA:  CHF. EXAM: PORTABLE CHEST 1 VIEW COMPARISON:  05/19/2021. FINDINGS: Chronic coarsened interstitial markings with improved superimposed patchy bilateral airspace opacities. Chronic interstitial lung disease was better characterized on prior CT chest from Mar 14, 2021. No visible pleural effusions or pneumothorax on this single semi erect AP radiograph. Cardiac silhouette is largely obscured. Aortic atherosclerosis. Left subclavian approach dual lead cardiac rhythm maintenance device in similar position. Polyarticular degenerative change. IMPRESSION: Findings suggestive of improving multifocal pneumonia and/or edema superimposed on chronic interstitial lung disease. Electronically Signed   By: Feliberto Harts MD   On: 05/24/2021 07:48   DG CHEST PORT 1 VIEW  Result Date: 05/19/2021 CLINICAL DATA:  Shortness of breath, cough, fever and chills. EXAM: PORTABLE CHEST 1 VIEW COMPARISON:  Chest x-rays dated 05/17/2021 and 03/10/2021. FINDINGS: Ill-defined airspace opacities bilaterally, likely superimposed on chronic interstitial lung disease. No pleural effusion or pneumothorax is seen. Heart size and mediastinal contours are grossly stable. LEFT chest wall pacemaker/ICD hardware in place. IMPRESSION: Multifocal pneumonia superimposed on chronic interstitial lung disease, not significantly changed compared to chest x-ray of 05/17/2021. Electronically Signed   By: Bary Richard M.D.   On: 05/19/2021 08:34   ECHOCARDIOGRAM COMPLETE  Result Date: 05/18/2021    ECHOCARDIOGRAM REPORT   Patient Name:   JODEN BONSALL Date of Exam: 05/18/2021 Medical Rec #:  [id number]        Height:       65.0 in Accession #:    [id number]       Weight:       133.6 lb Date of Birth:  08/11/1924        BSA:          1.666 m Patient Age:    87 years         BP:           147/84 mmHg Patient Gender: M                HR:           73 bpm. Exam Location:  Inpatient Procedure: 2D Echo, Cardiac Doppler and Color Doppler  Indications:    CHF  History:        Patient has prior history of Echocardiogram examinations, most                 recent 06/22/2018. CHF, Pacemaker, Signs/Symptoms:Shortness of                 Breath; Risk Factors:Dyslipidemia. 06/01/2017 pacemaker.  Sonographer:    Neomia Dear RDCS Referring Phys: [id number] VASUNDHRA RATHORE IMPRESSIONS  1. Left ventricular ejection fraction, by estimation, is 50 to 55%. The left ventricle has low normal function. The left ventricle has no regional wall motion abnormalities. Left ventricular diastolic parameters are consistent with Grade I diastolic dysfunction (impaired relaxation).  2. Right ventricular systolic function is normal. The right ventricular size is normal. There is mildly elevated pulmonary artery systolic pressure.  3. The mitral valve is normal in structure. Mild mitral valve regurgitation.  4. The aortic valve is tricuspid. Aortic valve regurgitation is trivial.  5. The inferior vena cava is normal in size with greater than 50% respiratory variability, suggesting right atrial pressure of 3 mmHg. FINDINGS  Left Ventricle: There is substantial septal-lateral and apex-to-base dyssynchrony. Left ventricular ejection fraction, by estimation, is 50 to 55%. The left ventricle has low normal function. The left ventricle has no regional wall motion abnormalities.  The left ventricular internal cavity size was normal in size. There is no left ventricular hypertrophy. Abnormal (paradoxical) septal motion, consistent with RV pacemaker. Left ventricular diastolic parameters are consistent  with Grade I diastolic dysfunction (impaired relaxation). Normal left ventricular filling pressure. Right Ventricle: The right ventricular size is normal. No increase in right ventricular wall thickness. Right ventricular systolic function is normal. There is mildly elevated pulmonary artery systolic pressure. The tricuspid regurgitant velocity is 2.96  m/s, and with an assumed right atrial pressure of 3 mmHg, the estimated right ventricular systolic pressure is 38.0 mmHg. Left Atrium: Left atrial size was normal in size. Right Atrium: Right atrial size was normal in size. Pericardium: There is no evidence of pericardial effusion. Mitral Valve: The mitral valve is normal in structure. Mild mitral valve regurgitation. Tricuspid Valve: The tricuspid valve is normal in structure. Tricuspid valve regurgitation is mild. Aortic Valve: The aortic valve is tricuspid. Aortic valve regurgitation is trivial. Aortic regurgitation PHT measures 479 msec. Aortic valve mean gradient measures 6.0 mmHg. Aortic valve peak gradient measures 11.6 mmHg. Aortic valve area, by VTI measures 2.39 cm. Pulmonic Valve: The pulmonic valve was not well visualized. Pulmonic valve regurgitation is not visualized. Aorta: The aortic root and ascending aorta are structurally normal, with no evidence of dilitation. Venous: The inferior vena cava is normal in size with greater than 50% respiratory variability, suggesting right atrial pressure of 3 mmHg. IAS/Shunts: No atrial level shunt detected by color flow Doppler. Additional Comments: A device lead is visualized in the right ventricle.  LEFT VENTRICLE PLAX 2D LVIDd:         4.30 cm     Diastology LVIDs:         4.00 cm     LV e' medial:    3.70 cm/s LV PW:         1.20 cm     LV E/e' medial:  12.4 LV IVS:        1.10 cm     LV e' lateral:   15.90 cm/s LVOT diam:     2.10 cm     LV E/e' lateral: 2.9 LV SV:  76 LV SV Index:   46 LVOT Area:     3.46 cm  LV Volumes (MOD) LV vol d, MOD A2C: 83.6  ml LV vol d, MOD A4C: 51.1 ml LV vol s, MOD A2C: 47.6 ml LV vol s, MOD A4C: 35.2 ml LV SV MOD A2C:     36.0 ml LV SV MOD A4C:     51.1 ml LV SV MOD BP:      23.9 ml RIGHT VENTRICLE RV Basal diam:  3.50 cm RV Mid diam:    2.50 cm RV S prime:     16.20 cm/s TAPSE (M-mode): 1.7 cm LEFT ATRIUM           Index       RIGHT ATRIUM           Index LA Vol (A2C): 29.3 ml 17.58 ml/m RA Area:     14.80 cm LA Vol (A4C): 28.0 ml 16.80 ml/m RA Volume:   33.10 ml  19.86 ml/m  AORTIC VALVE                    PULMONIC VALVE AV Area (Vmax):    2.26 cm     PV Vmax:       0.67 m/s AV Area (Vmean):   2.24 cm     PV Vmean:      47.700 cm/s AV Area (VTI):     2.39 cm     PV VTI:        0.147 m AV Vmax:           170.00 cm/s  PV Peak grad:  1.8 mmHg AV Vmean:          114.000 cm/s PV Mean grad:  1.0 mmHg AV VTI:            0.317 m AV Peak Grad:      11.6 mmHg AV Mean Grad:      6.0 mmHg LVOT Vmax:         111.00 cm/s LVOT Vmean:        73.800 cm/s LVOT VTI:          0.219 m LVOT/AV VTI ratio: 0.69 AI PHT:            479 msec  AORTA Ao Root diam: 3.40 cm Ao Asc diam:  3.20 cm MITRAL VALVE                TRICUSPID VALVE MV Area (PHT): 4.96 cm     TR Peak grad:   35.0 mmHg MV Decel Time: 153 msec     TR Vmax:        296.00 cm/s MR Peak grad: 86.1 mmHg MR Vmax:      464.00 cm/s   SHUNTS MV E velocity: 46.00 cm/s   Systemic VTI:  0.22 m MV A velocity: 110.00 cm/s  Systemic Diam: 2.10 cm MV E/A ratio:  0.42 Mihai Croitoru MD Electronically signed by Thurmon FairMihai Croitoru MD Signature Date/Time: 05/18/2021/1:51:29 PM    Final     Cardiac Studies   ECHO: 05/18/2021  1. Left ventricular ejection fraction, by estimation, is 50 to 55%. The left ventricle has low normal function. The left ventricle has no regional wall motion abnormalities. Left ventricular diastolic parameters are consistent with Grade I diastolic dysfunction (impaired relaxation).   2. Right ventricular systolic function is normal. The right ventricular size is normal. There is  mildly elevated pulmonary artery systolic pressure.  RVSP 38  3.  The mitral valve is normal in structure. Mild mitral valve  regurgitation.   4. The aortic valve is tricuspid. Aortic valve regurgitation is trivial.  Peak gradient 11.6 mmHg  5. The inferior vena cava is normal in size with greater than 50%  respiratory variability, suggesting right atrial pressure of 3 mmHg.  Patient Profile     87 y.o. male with a hx of syncope, CHB w/p BSci PPM, spinal stenosis with b/l neuropathy, COVID 10/2020, ILD (seen by Dr Isaiah Serge), HTN, HLD, HOH, who is being seen 05/30/2021 for orthostatic hypotension.  He was admitted on 05/17/2021 with low-grade fever, O2 saturation in the upper 70s, cough and shortness of breath.  He was diagnosed with pneumonia, COVID-negative.     He was volume overloaded and given Lasix twice.  He was noted to have orthostatic hypotension on 7/15.  Lasix was held.  He was started on midodrine.     He remained hypoxic and it was felt he would need to be discharged on home O2.  He completed antibiotics for the pneumonia.  Cosyntropin stim test was within normal limits. Procalcitonin was negative and he was weaned off steroids.     On 7/19, he was given a 500 cc bolus for the orthostatic hypotension.  He had on compression stockings and the midodrine was increased.  He has been off carvedilol since admit. Cards asked to see 07/21 for unresolved orthostatic hypotension w/ SBP 80s when standing.   Assessment & Plan    Orthostatic Hypotension - check orthostatic VS daily, still markedly positive and symptomatic today - midodrine increased 07/21, 5 to 10 mg tid, however, he got the increased dose for the first time this a.m. - Continue the increased dose of midodrine, recheck orthostatics in a.m. -Explained reverse Trendelenburg sleeping position to the patient, he understands and will make the nursing staff aware also - After a day or so on the higher dose of midodrine and sleeping in  reverse Trendelenburg, can also try an abdominal binder if still symptomatic  2.  Acute diastolic CHF - He was felt volume overloaded on arrival and got a total of 80 mg of Lasix over the first 2 days - The orthostatic hypotension then became pronounced and the diuretic was discontinued -His weight is starting to trend back up and he has some volume overload on exam. -However, his lung sounds will always be abnormal because of his chronic interstitial lung disease - Discuss with MD parameters for giving Lasix  Otherwise, per IM  For questions or updates, please contact [facility] Please consult [email or web address] for contact info under        Signed, Theodore Demark, PA-C  05/31/2021, 7:45 AM

## 2021-05-31 NOTE — Progress Notes (Signed)
Cardiology instructed RN to place smaller TED hose on patient and position patient in reverse trendelenberg position.    RN completed this instructions.  Patient comfortable.

## 2021-06-01 DIAGNOSIS — J9601 Acute respiratory failure with hypoxia: Secondary | ICD-10-CM | POA: Diagnosis not present

## 2021-06-01 DIAGNOSIS — J849 Interstitial pulmonary disease, unspecified: Secondary | ICD-10-CM | POA: Diagnosis not present

## 2021-06-01 DIAGNOSIS — L89302 Pressure ulcer of unspecified buttock, stage 2: Secondary | ICD-10-CM

## 2021-06-01 DIAGNOSIS — J189 Pneumonia, unspecified organism: Secondary | ICD-10-CM | POA: Diagnosis not present

## 2021-06-01 DIAGNOSIS — I951 Orthostatic hypotension: Secondary | ICD-10-CM | POA: Diagnosis not present

## 2021-06-01 MED ORDER — MIDODRINE HCL 5 MG PO TABS
10.0000 mg | ORAL_TABLET | Freq: Three times a day (TID) | ORAL | Status: DC
  Administered 2021-06-01 – 2021-06-03 (×8): 10 mg via ORAL
  Filled 2021-06-01 (×8): qty 2

## 2021-06-01 MED ORDER — FLUDROCORTISONE ACETATE 0.1 MG PO TABS
0.0500 mg | ORAL_TABLET | Freq: Every day | ORAL | Status: DC
  Administered 2021-06-01: 0.05 mg via ORAL
  Filled 2021-06-01 (×2): qty 0.5

## 2021-06-01 NOTE — Progress Notes (Addendum)
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  He was seen in the ED and admitted for severe sepsis due to community-acquired pneumonia acute hypoxic respiratory failure acute on chronic diastolic CHF. Patient treated with iv antibiotics. Also managed with IV Lasix supplemental oxygen. He has been advised to go to SNF due to dizziness.  Subjective: Seen this morning.  Patient was on the bedside commode.  Continues to have positive orthostatics, reports mild-mod dizziness. He does state that his dizziness is longstanding and has slightly worsened in the hospital. He reports 3 falls over the last 55mo.  Assessment & Plan: Orthostatic hypotension:  Overall, suspect this has been longstanding and acutely worsened in the setting of diuretic therapy. Patient has had robust UOP to IV fluids, which has not helped with a positive fluid balance. Workup thus far: cortisol/cosyntropin negative. - Patient was given fluid boluses and has been on IV NS (given 136ml/h for over 24h - order written for 10 days), appears that plan was for only 10hrs, so will DC - Midodrine at 10 mg 3 times daily by cardiology, on TED hose/compression stocking.   - will start fludrocortisone daily  Severe sepsis POA due to pneumonia, resolved Community-acquired pneumonia, improving Clinically stable he has completed antibiotics with ceftriaxone/azithromycin. He has been afebrile, overall stable. Last procal NEG - no further antibx - cough improving  No results for input(s): WBC, LATICACIDVEN, PROCALCITON  in the last 168 hours.  Acute hypoxic aspiratory failure 2/2 PNA - required up to 2L nasal cannula on admission - weaning oxygen, currently on room air - would benefit from Home O2 evaluation if he progresses to home  Chronic diastolic CHF: Initially thought to be volume overloaded, given IV lasix in ED and worsened his orthostasis - TTE 05/2021 with borderline EF 50-55% and impaired relaxation - holding BB in the setting of midodrine  Filed Weights   05/30/21 0417 05/31/21 0431 06/01/21 0500  Weight: 57.2 kg 58.3 kg 57 kg   Hypertension:  - blood pressures worse while on midodrine, will CTM - may need to discuss with Cardiology if Bps continue worsen, currently holding coreg 12.5mg  BID  Stage 2 pressure injury - wound care per RN  Chronic conditions:   Constipation: Continue as needed laxatives  Generalized weakness: PT OT, continue to address orthostatic hypotension.  SNF planned  ILD - continue inhalers,IS,supplemental oxygen, persistent rales on exam  Hyperlipidemia: cont statin  Complete heart block with pacemaker in place. Review of telemetry suggests A-sense, V paced, stable.  Hemorrhoid: Cont Anusol  Diet Order             Diet Heart Room service appropriate? Yes; Fluid consistency: Thin  Diet effective now                   Nutrition Problem: Increased nutrient needs Etiology: acute illness Signs/Symptoms: estimated needs Interventions: Ensure Enlive (each supplement provides 350kcal and 20 grams of protein) Patient's Body mass index is 19.11 kg/m.  Pressure Injury 05/18/21 Coccyx Mid Stage 1 -  Intact skin with non-blanchable redness of a localized area  usually over a bony prominence. (Active)  05/18/21 1700  Location: Coccyx  Location Orientation: Mid  Staging: Stage 1 -  Intact skin with non-blanchable redness of a localized area usually over a bony prominence.  Wound Description (Comments):   Present on Admission: Yes     Pressure Injury  05/18/21 Buttocks Left Stage 2 -  Partial thickness loss of dermis presenting as a shallow open injury with a red, pink wound bed without slough. (Active)  05/18/21 1700  Location: Buttocks  Location Orientation: Left  Staging: Stage 2 -  Partial thickness loss of dermis presenting as a shallow open injury with a red, pink wound bed without slough.  Wound Description (Comments):   Present on Admission: Yes   DVT prophylaxis: Place TED hose Start: 06/01/21 0811 enoxaparin (LOVENOX) injection 40 mg Start: 05/18/21 1000 Code Status:   Code Status: DNR  Family Communication: plan of care discussed with patient at bedside    Status is: Inpatient Remains inpatient appropriate because:Inpatient level of care appropriate due to severity of illness Dispo: The patient is from: Home              Anticipated d/c is to: SNF once orthostatic vitals improves and bed available. Hopefully 1-2 days              Patient currently NOT medically stable for discharge.   Difficult to place patient No  Unresulted Labs (From admission, onward)     Start     Ordered   06/02/21 0500  Hemoglobin A1c  Tomorrow morning,   R       Question:  Specimen collection method  Answer:  Lab=Lab collect   06/01/21 0754   06/02/21 0500  Basic metabolic panel  Tomorrow morning,   R       Question:  Specimen collection method  Answer:  Lab=Lab collect   06/01/21 0755   06/02/21 0500  Hepatic function panel  Tomorrow morning,   R       Question:  Specimen collection method  Answer:  Lab=Lab collect   06/01/21 0756           Medications reviewed:  Scheduled Meds:  arformoterol  15 mcg Nebulization BID   And   umeclidinium bromide  1 puff Inhalation Daily   And   budesonide (PULMICORT) nebulizer solution  1 mg Nebulization BID   aspirin EC  81 mg Oral Daily   docusate sodium  100 mg Oral BID   enoxaparin (LOVENOX) injection  40 mg Subcutaneous Q24H   feeding supplement  237 mL Oral BID BM   fludrocortisone  0.05 mg  Oral Daily   gabapentin  100 mg Oral QHS   hydrocortisone  25 mg Rectal BID   magnesium gluconate  500 mg Oral QHS   midodrine  10 mg Oral TID WC   oxybutynin  5 mg Oral BID   pantoprazole  20 mg Oral Daily   polyethylene glycol  17 g Oral Daily   rOPINIRole  0.25 mg Oral QHS   rosuvastatin  10 mg Oral QHS   vitamin B-12  5,000 mcg Oral Daily   Continuous Infusions: discontinued    Consultants: Cardiology Procedures:see note Antimicrobials: Anti-infectives (From admission, onward)    Start     Dose/Rate Route Frequency Ordered Stop   05/19/21 0100  cefTRIAXone (ROCEPHIN) 1 g in sodium chloride 0.9 % 100 mL IVPB        1 g 200 mL/hr over 30 Minutes Intravenous Every  24 hours 05/18/21 0341 05/24/21 0057   05/18/21 0400  azithromycin (ZITHROMAX) 500 mg in sodium chloride 0.9 % 250 mL IVPB  Status:  Discontinued        500 mg 250 mL/hr over 60 Minutes Intravenous Every 24 hours 05/18/21 0341 05/23/21 0716   05/17/21 2345  cefTRIAXone (ROCEPHIN) 1 g in sodium chloride 0.9 % 100 mL IVPB        1 g 200 mL/hr over 30 Minutes Intravenous  Once 05/17/21 2333 05/18/21 0116      Culture/Microbiology    Component Value Date/Time   SDES  05/18/2021 0541    URINE, RANDOM Performed at [facility], [address] [place], [place] [address]    SPECREQUEST  05/18/2021 0541    NONE Performed at [facility], [address] [place], [place] [address]    CULT (A) 05/18/2021 0541    <10,000 COLONIES/mL INSIGNIFICANT GROWTH Performed at [facility], [address] [place], [place] [address]    REPTSTATUS 05/19/2021 FINAL 05/18/2021 0541    Other culture-see note  Objective: Vitals: Today's Vitals   06/01/21 0500 06/01/21 0754 06/01/21 1020 06/01/21 1319  BP:    (!) 186/98  Pulse:    86  Resp:    18  Temp:    98.1 F (36.7 C)  TempSrc:    Oral  SpO2:  97%  98%  Weight: 57 kg     Height:      PainSc:   0-No pain      Intake/Output Summary (Last 24 hours) at 06/01/2021 1517 Last data filed at 06/01/2021 1453 Gross per 24 hour  Intake 806.66 ml  Output 3300 ml  Net -2493.34 ml    Filed Weights   05/30/21 0417 05/31/21 0431 06/01/21 0500  Weight: 57.2 kg 58.3 kg 57 kg   Weight change: -1.3 kg  Intake/Output from previous day: 07/22 0701 - 07/23 0700 In: 766.7 [P.O.:560; I.V.:206.7] Out: 3150 [Urine:3150] Intake/Output this shift: Total I/O In: 360 [P.O.:360] Out: 1400 [Urine:1400] Filed Weights   05/30/21 0417 05/31/21 0431 06/01/21 0500  Weight: 57.2 kg 58.3 kg 57 kg   Examination: General exam: elderly gentleman, hard of hearing  HEENT: Oral mucosa moist, Ear/Nose WNL grossly, dentition normal. Respiratory system: bilateral basilar crackles (appears to be chronic), no use of accessory muscle Cardiovascular system: S1 & S2 +, JVP at clavicle at 45 deg Gastrointestinal system: Abdomen soft, NT, ND, BS+ Nervous System:Alert, awake, moving extremities and grossly nonfocal Extremities: no edema, distal peripheral pulses palpable.  Skin: No rashes,no icterus. MSK: Normal muscle bulk,tone, power    Data Reviewed: I have personally reviewed following labs and imaging studies CBC: No results for input(s): WBC, NEUTROABS, HGB, HCT, MCV, PLT in the last 168 hours. Basic Metabolic Panel: Recent Labs  Lab 05/27/21 1122  NA 138  K 3.7  CL 95*  CO2 33*  GLUCOSE 161*  BUN 35*  CREATININE 0.88  CALCIUM 8.8*   GFR: Estimated Creatinine Clearance: 41.4 mL/min (by C-G formula based on SCr of 0.88 mg/dL). Liver Function Tests: No results for input(s): AST, ALT, ALKPHOS, BILITOT, PROT, ALBUMIN in the last 168 hours.  No results for input(s): LIPASE, AMYLASE in the last 168 hours. No results for input(s): AMMONIA in the last 168 hours. Coagulation Profile: No results for input(s): INR, PROTIME in the last 168 hours.  Cardiac Enzymes: No results for input(s): CKTOTAL, CKMB, CKMBINDEX,  TROPONINI in the last 168 hours. BNP (last  3 results) No results for input(s): PROBNP in the last 8760 hours. HbA1C: No results for input(s): HGBA1C in the last 72 hours. CBG: No results for input(s): GLUCAP in the last 168 hours. Lipid Profile: No results for input(s): CHOL, HDL, LDLCALC, TRIG, CHOLHDL, LDLDIRECT in the last 72 hours. Thyroid Function Tests: No results for input(s): TSH, T4TOTAL, FREET4, T3FREE, THYROIDAB in the last 72 hours. Anemia Panel: No results for input(s): VITAMINB12, FOLATE, FERRITIN, TIBC, IRON, RETICCTPCT in the last 72 hours. Sepsis Labs: No results for input(s): PROCALCITON, LATICACIDVEN in the last 168 hours.  Recent Results (from the past 240 hour(s))  SARS CORONAVIRUS 2 (TAT 6-24 HRS) Nasopharyngeal Nasopharyngeal Swab     Status: None   Collection Time: 05/29/21  5:25 PM   Specimen: Nasopharyngeal Swab  Result Value Ref Range Status   SARS Coronavirus 2 NEGATIVE NEGATIVE Final    Comment: (NOTE) SARS-CoV-2 target nucleic acids are NOT DETECTED.  The SARS-CoV-2 RNA is generally detectable in upper and lower respiratory specimens during the acute phase of infection. Negative results do not preclude SARS-CoV-2 infection, do not rule out co-infections with other pathogens, and should not be used as the sole basis for treatment or other patient management decisions. Negative results must be combined with clinical observations, patient history, and epidemiological information. The expected result is Negative.  Fact Sheet for Patients: HairSlick.no  Fact Sheet for Healthcare Providers: quierodirigir.com  This test is not yet approved or cleared by the [place] FDA and  has been authorized for detection and/or diagnosis of SARS-CoV-2 by FDA under an Emergency Use Authorization (EUA). This EUA will remain  in effect (meaning this test can be used) for the duration of the COVID-19  declaration under Se ction 564(b)(1) of the Act, 21 U.S.C. section 360bbb-3(b)(1), unless the authorization is terminated or revoked sooner.  Performed at [facility], [address] [place], [place] [address]      Radiology Studies: No results found.   LOS: 14 days   Rachael Fee, MD [facility]  06/01/2021, 3:17 PM

## 2021-06-01 NOTE — Progress Notes (Signed)
Patient lost IV access prior to IVFs being discontinued, Notified MD of difficult/unsuccessful IV start and requested to leave IV out per patient request, response pending

## 2021-06-01 NOTE — Progress Notes (Addendum)
Progress Note  Patient Name: Justin Richmond Date of Encounter: 06/01/2021  [facility] Cardiologist: None  Electrophysiologist: Sherryl Manges, MD   Subjective   Still gets dizzy when standing up  Inpatient Medications    Scheduled Meds:  arformoterol  15 mcg Nebulization BID   And   umeclidinium bromide  1 puff Inhalation Daily   And   budesonide (PULMICORT) nebulizer solution  1 mg Nebulization BID   aspirin EC  81 mg Oral Daily   docusate sodium  100 mg Oral BID   enoxaparin (LOVENOX) injection  40 mg Subcutaneous Q24H   feeding supplement  237 mL Oral BID BM   gabapentin  100 mg Oral QHS   hydrocortisone  25 mg Rectal BID   magnesium gluconate  500 mg Oral QHS   midodrine  10 mg Oral TID WC   oxybutynin  5 mg Oral BID   pantoprazole  20 mg Oral Daily   polyethylene glycol  17 g Oral Daily   rOPINIRole  0.25 mg Oral QHS   rosuvastatin  10 mg Oral QHS   vitamin B-12  5,000 mcg Oral Daily   Continuous Infusions:  sodium chloride 100 mL/hr at 05/31/21 2338   PRN Meds: acetaminophen **OR** acetaminophen, albuterol, lip balm, witch hazel-glycerin   Vital Signs    Vitals:   05/31/21 2033 05/31/21 2034 06/01/21 0500 06/01/21 0754  BP:      Pulse:      Resp:      Temp:      TempSrc:      SpO2: 98% 98%  97%  Weight:   57 kg   Height:        Intake/Output Summary (Last 24 hours) at 06/01/2021 0757 Last data filed at 06/01/2021 3846 Gross per 24 hour  Intake 766.66 ml  Output 3150 ml  Net -2383.34 ml    Last 3 Weights 06/01/2021 05/31/2021 05/30/2021  Weight (lbs) 125 lb 10.6 oz 128 lb 8.5 oz 126 lb 1.7 oz  Weight (kg) 57 kg 58.3 kg 57.2 kg     Orthostatic VS for the past 24 hrs (Last 3 readings):  BP- Lying Pulse- Lying BP- Sitting Pulse- Sitting BP- Standing at 0 minutes Pulse- Standing at 0 minutes BP- Standing at 3 minutes Pulse- Standing at 3 minutes  05/31/21 1219 145/79 103 118/69 108 94/70 111 -- --  05/31/21 0834 133/76 87 118/74 96 (!) 89/44  103 100/69 108  05/31/21 0817 140/63 86 -- -- -- -- -- --      Telemetry    Sinus rhythm, ventricular pacing, PVCs- Personally Reviewed  ECG    None today- Personally Reviewed  Physical Exam   GEN: No acute distress.   Neck: JVD 8-9 cm, better seen on the right Cardiac: Irregular rate and rhythm , no murmurs, rubs, or gallops.  Respiratory: Rales bases bilaterally. GI: Soft, nontender, non-distended  MS: No edema; No deformity.  TED hose in place, but are loose and not very helpful Neuro:  Nonfocal  Psych: Normal affect   Labs    High Sensitivity Troponin:  No results for input(s): TROPONINIHS in the last 720 hours.    Chemistry Recent Labs  Lab 05/27/21 1122  NA 138  K 3.7  CL 95*  CO2 33*  GLUCOSE 161*  BUN 35*  CREATININE 0.88  CALCIUM 8.8*  GFRNONAA >60  ANIONGAP 10      Hematology No results for input(s): WBC, RBC, HGB, HCT, MCV, MCH, MCHC, RDW, PLT  in the last 168 hours.   BNPNo results for input(s): BNP, PROBNP in the last 168 hours.   DDimer No results for input(s): DDIMER in the last 168 hours.   Radiology    DG Chest 2 View  Result Date: 05/17/2021 CLINICAL DATA:  [facility] Pt has wet cough. Pmhx of pacemaker and urinary retention. Pt states he has had urinary retention for 3 days. Pt denies chest pain, and has a hx of HTN, and CHF. EXAM: CHEST - 2 VIEW COMPARISON:  Chest x-ray 03/10/2021, CT chest 03/14/2021 FINDINGS: The heart size and mediastinal contours are unchanged. Aortic calcification. Left chest wall dual lead cardiac pacemaker in similar position. Biapical pleural/pulmonary scarring. Interval increase in diffuse patchy airspace opacities. Similar appearing coarsened interstitial markings. Possible trace left pleural effusion. No right pleural effusion. No pneumothorax. No acute osseous abnormality. IMPRESSION: 1. Chronic interstitial lung disease with superimposed infection/inflammation. 2. Possible trace left  pleural effusion Electronically Signed   By: Tish FredericksonMorgane  Naveau M.D.   On: 05/17/2021 23:19   DG Chest Port 1 View  Result Date: 05/24/2021 CLINICAL DATA:  CHF. EXAM: PORTABLE CHEST 1 VIEW COMPARISON:  05/19/2021. FINDINGS: Chronic coarsened interstitial markings with improved superimposed patchy bilateral airspace opacities. Chronic interstitial lung disease was better characterized on prior CT chest from Mar 14, 2021. No visible pleural effusions or pneumothorax on this single semi erect AP radiograph. Cardiac silhouette is largely obscured. Aortic atherosclerosis. Left subclavian approach dual lead cardiac rhythm maintenance device in similar position. Polyarticular degenerative change. IMPRESSION: Findings suggestive of improving multifocal pneumonia and/or edema superimposed on chronic interstitial lung disease. Electronically Signed   By: Feliberto HartsFrederick S Jones MD   On: 05/24/2021 07:48   DG CHEST PORT 1 VIEW  Result Date: 05/19/2021 CLINICAL DATA:  Shortness of breath, cough, fever and chills. EXAM: PORTABLE CHEST 1 VIEW COMPARISON:  Chest x-rays dated 05/17/2021 and 03/10/2021. FINDINGS: Ill-defined airspace opacities bilaterally, likely superimposed on chronic interstitial lung disease. No pleural effusion or pneumothorax is seen. Heart size and mediastinal contours are grossly stable. LEFT chest wall pacemaker/ICD hardware in place. IMPRESSION: Multifocal pneumonia superimposed on chronic interstitial lung disease, not significantly changed compared to chest x-ray of 05/17/2021. Electronically Signed   By: Bary RichardStan  Maynard M.D.   On: 05/19/2021 08:34   ECHOCARDIOGRAM COMPLETE  Result Date: 05/18/2021    ECHOCARDIOGRAM REPORT   Patient Name:   Justin Richmond Date of Exam: 05/18/2021 Medical Rec #:  [id number]        Height:       65.0 in Accession #:    [id number]       Weight:       133.6 lb Date of Birth:  10/03/1924        BSA:          1.666 m Patient Age:    87 years         BP:           147/84 mmHg  Patient Gender: M                HR:           73 bpm. Exam Location:  Inpatient Procedure: 2D Echo, Cardiac Doppler and Color Doppler Indications:    CHF  History:        Patient has prior history of Echocardiogram examinations, most                 recent 06/22/2018. CHF,  Pacemaker, Signs/Symptoms:Shortness of                 Breath; Risk Factors:Dyslipidemia. 06/01/2017 pacemaker.  Sonographer:    Neomia Dear RDCS Referring Phys: [id number] VASUNDHRA RATHORE IMPRESSIONS  1. Left ventricular ejection fraction, by estimation, is 50 to 55%. The left ventricle has low normal function. The left ventricle has no regional wall motion abnormalities. Left ventricular diastolic parameters are consistent with Grade I diastolic dysfunction (impaired relaxation).  2. Right ventricular systolic function is normal. The right ventricular size is normal. There is mildly elevated pulmonary artery systolic pressure.  3. The mitral valve is normal in structure. Mild mitral valve regurgitation.  4. The aortic valve is tricuspid. Aortic valve regurgitation is trivial.  5. The inferior vena cava is normal in size with greater than 50% respiratory variability, suggesting right atrial pressure of 3 mmHg. FINDINGS  Left Ventricle: There is substantial septal-lateral and apex-to-base dyssynchrony. Left ventricular ejection fraction, by estimation, is 50 to 55%. The left ventricle has low normal function. The left ventricle has no regional wall motion abnormalities.  The left ventricular internal cavity size was normal in size. There is no left ventricular hypertrophy. Abnormal (paradoxical) septal motion, consistent with RV pacemaker. Left ventricular diastolic parameters are consistent with Grade I diastolic dysfunction (impaired relaxation). Normal left ventricular filling pressure. Right Ventricle: The right ventricular size is normal. No increase in right ventricular wall thickness. Right ventricular systolic function is normal. There is  mildly elevated pulmonary artery systolic pressure. The tricuspid regurgitant velocity is 2.96  m/s, and with an assumed right atrial pressure of 3 mmHg, the estimated right ventricular systolic pressure is 38.0 mmHg. Left Atrium: Left atrial size was normal in size. Right Atrium: Right atrial size was normal in size. Pericardium: There is no evidence of pericardial effusion. Mitral Valve: The mitral valve is normal in structure. Mild mitral valve regurgitation. Tricuspid Valve: The tricuspid valve is normal in structure. Tricuspid valve regurgitation is mild. Aortic Valve: The aortic valve is tricuspid. Aortic valve regurgitation is trivial. Aortic regurgitation PHT measures 479 msec. Aortic valve mean gradient measures 6.0 mmHg. Aortic valve peak gradient measures 11.6 mmHg. Aortic valve area, by VTI measures 2.39 cm. Pulmonic Valve: The pulmonic valve was not well visualized. Pulmonic valve regurgitation is not visualized. Aorta: The aortic root and ascending aorta are structurally normal, with no evidence of dilitation. Venous: The inferior vena cava is normal in size with greater than 50% respiratory variability, suggesting right atrial pressure of 3 mmHg. IAS/Shunts: No atrial level shunt detected by color flow Doppler. Additional Comments: A device lead is visualized in the right ventricle.  LEFT VENTRICLE PLAX 2D LVIDd:         4.30 cm     Diastology LVIDs:         4.00 cm     LV e' medial:    3.70 cm/s LV PW:         1.20 cm     LV E/e' medial:  12.4 LV IVS:        1.10 cm     LV e' lateral:   15.90 cm/s LVOT diam:     2.10 cm     LV E/e' lateral: 2.9 LV SV:         76 LV SV Index:   46 LVOT Area:     3.46 cm  LV Volumes (MOD) LV vol d, MOD A2C: 83.6 ml LV vol d, MOD A4C: 51.1 ml LV vol  s, MOD A2C: 47.6 ml LV vol s, MOD A4C: 35.2 ml LV SV MOD A2C:     36.0 ml LV SV MOD A4C:     51.1 ml LV SV MOD BP:      23.9 ml RIGHT VENTRICLE RV Basal diam:  3.50 cm RV Mid diam:    2.50 cm RV S prime:     16.20 cm/s  TAPSE (M-mode): 1.7 cm LEFT ATRIUM           Index       RIGHT ATRIUM           Index LA Vol (A2C): 29.3 ml 17.58 ml/m RA Area:     14.80 cm LA Vol (A4C): 28.0 ml 16.80 ml/m RA Volume:   33.10 ml  19.86 ml/m  AORTIC VALVE                    PULMONIC VALVE AV Area (Vmax):    2.26 cm     PV Vmax:       0.67 m/s AV Area (Vmean):   2.24 cm     PV Vmean:      47.700 cm/s AV Area (VTI):     2.39 cm     PV VTI:        0.147 m AV Vmax:           170.00 cm/s  PV Peak grad:  1.8 mmHg AV Vmean:          114.000 cm/s PV Mean grad:  1.0 mmHg AV VTI:            0.317 m AV Peak Grad:      11.6 mmHg AV Mean Grad:      6.0 mmHg LVOT Vmax:         111.00 cm/s LVOT Vmean:        73.800 cm/s LVOT VTI:          0.219 m LVOT/AV VTI ratio: 0.69 AI PHT:            479 msec  AORTA Ao Root diam: 3.40 cm Ao Asc diam:  3.20 cm MITRAL VALVE                TRICUSPID VALVE MV Area (PHT): 4.96 cm     TR Peak grad:   35.0 mmHg MV Decel Time: 153 msec     TR Vmax:        296.00 cm/s MR Peak grad: 86.1 mmHg MR Vmax:      464.00 cm/s   SHUNTS MV E velocity: 46.00 cm/s   Systemic VTI:  0.22 m MV A velocity: 110.00 cm/s  Systemic Diam: 2.10 cm MV E/A ratio:  0.42 Mihai Croitoru MD Electronically signed by Thurmon Fair MD Signature Date/Time: 05/18/2021/1:51:29 PM    Final     Cardiac Studies   ECHO: 05/18/2021  1. Left ventricular ejection fraction, by estimation, is 50 to 55%. The left ventricle has low normal function. The left ventricle has no regional wall motion abnormalities. Left ventricular diastolic parameters are consistent with Grade I diastolic dysfunction (impaired relaxation).   2. Right ventricular systolic function is normal. The right ventricular size is normal. There is mildly elevated pulmonary artery systolic pressure.  RVSP 38  3. The mitral valve is normal in structure. Mild mitral valve  regurgitation.   4. The aortic valve is tricuspid. Aortic valve regurgitation is trivial.  Peak gradient 11.6 mmHg  5. The  inferior vena  cava is normal in size with greater than 50%  respiratory variability, suggesting right atrial pressure of 3 mmHg.  Patient Profile     87 y.o. male with a hx of syncope, CHB w/p BSci PPM, spinal stenosis with b/l neuropathy, COVID 10/2020, ILD (seen by Dr Isaiah Serge), HTN, HLD, HOH, who is being seen 05/30/2021 for orthostatic hypotension.  He was admitted on 05/17/2021 with low-grade fever, O2 saturation in the upper 70s, cough and shortness of breath.  He was diagnosed with pneumonia, COVID-negative.     He was volume overloaded and given Lasix twice.  He was noted to have orthostatic hypotension on 7/15.  Lasix was held.  He was started on midodrine.     He remained hypoxic and it was felt he would need to be discharged on home O2.  He completed antibiotics for the pneumonia.  Cosyntropin stim test was within normal limits. Procalcitonin was negative and he was weaned off steroids.     On 7/19, he was given a 500 cc bolus for the orthostatic hypotension.  He had on compression stockings and the midodrine was increased.  He has been off carvedilol since admit. Cards asked to see 07/21 for unresolved orthostatic hypotension w/ SBP 80s when standing.   Assessment & Plan    Orthostatic Hypotension - orthostatics were very positive yesterday but pending this am - am cortisol level normal today - midodrine increased 07/21 from 5 to 10 mg tid - will change timing of midodrine to 8am, 12Noon and 4pm - will repeat orthostatics this am - continue reverse Trendelenburg sleeping position to the patient, he understands and will make the nursing staff aware also - add abdominal binder - change knee high to thigh high compression hose  2.  Acute diastolic CHF - He was felt volume overloaded on arrival and got a total of 80 mg of Lasix over the first 2 days - The orthostatic hypotension then became pronounced and the diuretic was discontinued - he was given 1L NS yesterday - weight down 3 lbs  today - His lung sounds will always be abnormal because of his chronic interstitial lung disease  Otherwise, per IM  For questions or updates, please contact [facility] Please consult [email or web address] for contact info under        Signed, Armanda Magic, MD  06/01/2021, 7:57 AM

## 2021-06-02 DIAGNOSIS — I951 Orthostatic hypotension: Secondary | ICD-10-CM | POA: Diagnosis not present

## 2021-06-02 DIAGNOSIS — J189 Pneumonia, unspecified organism: Secondary | ICD-10-CM | POA: Diagnosis not present

## 2021-06-02 DIAGNOSIS — L89302 Pressure ulcer of unspecified buttock, stage 2: Secondary | ICD-10-CM | POA: Diagnosis not present

## 2021-06-02 DIAGNOSIS — J849 Interstitial pulmonary disease, unspecified: Secondary | ICD-10-CM | POA: Diagnosis not present

## 2021-06-02 LAB — BASIC METABOLIC PANEL
Anion gap: 7 (ref 5–15)
BUN: 17 mg/dL (ref 8–23)
CO2: 28 mmol/L (ref 22–32)
Calcium: 8.5 mg/dL — ABNORMAL LOW (ref 8.9–10.3)
Chloride: 104 mmol/L (ref 98–111)
Creatinine, Ser: 0.64 mg/dL (ref 0.61–1.24)
GFR, Estimated: >60 mL/min (ref >60)
Glucose, Bld: 100 mg/dL — ABNORMAL HIGH (ref 70–99)
Potassium: 4.1 mmol/L (ref 3.5–5.1)
Sodium: 139 mmol/L (ref 135–145)

## 2021-06-02 LAB — HEPATIC FUNCTION PANEL
ALT: 27 U/L (ref 0–44)
AST: 25 U/L (ref 15–41)
Albumin: 2.6 g/dL — ABNORMAL LOW (ref 3.5–5.0)
Alkaline Phosphatase: 53 U/L (ref 38–126)
Bilirubin, Direct: 0.1 mg/dL (ref 0.0–0.2)
Indirect Bilirubin: 0.5 mg/dL (ref 0.3–0.9)
Total Bilirubin: 0.6 mg/dL (ref 0.3–1.2)
Total Protein: 6.2 g/dL — ABNORMAL LOW (ref 6.5–8.1)

## 2021-06-02 NOTE — Progress Notes (Signed)
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  He was seen in the ED and admitted for severe sepsis due to community-acquired pneumonia acute hypoxic respiratory failure acute on chronic diastolic CHF. Patient treated with iv antibiotics. Also managed with IV Lasix supplemental oxygen. He has been advised to go to SNF due to dizziness.  Subjective: Seen this morning.  Patient was on the bedside commode.  Continues to have positive orthostatics (153/84 P-84, sitting 136/81 P-101, standing 113/67 P-115) and reports stable lightheadedness. Does not feel his symptoms have improved.  Assessment & Plan: Orthostatic hypotension:  Overall, suspect this has been longstanding and acutely worsened in the setting of diuretic therapy. Patient has had robust UOP to IV fluids, which has not helped with a positive fluid balance. Workup thus far: cortisol/cosyntropin negative. - Patient was given fluid boluses and continuous fluids - Midodrine at 10 mg 3 times daily by cardiology, on TED hose/compression stocking and abdominal binder - Patient denies improvement in symptoms, but Bps not dropping as low after receiving fludrocort at 10pm 7/23 + midodrine - may have vertigo component, holding oxybutynin  Severe sepsis POA due to pneumonia, resolved Community-acquired pneumonia, improving Clinically stable he has completed antibiotics with ceftriaxone/azithromycin. He has been afebrile, overall stable. Last procal NEG - no further antibx - cough improving  No results for input(s): WBC,  LATICACIDVEN, PROCALCITON in the last 168 hours.  Acute hypoxic aspiratory failure 2/2 PNA - required up to 2L nasal cannula on admission - weaning oxygen, currently on room air - would benefit from Home O2 evaluation if he progresses to home  Chronic diastolic CHF: Initially thought to be volume overloaded, given IV lasix in ED and worsened his orthostasis - TTE 05/2021 with borderline EF 50-55% and impaired relaxation - holding BB in the setting of midodrine  Filed Weights   05/30/21 0417 05/31/21 0431 06/01/21 0500  Weight: 57.2 kg 58.3 kg 57 kg   Hypertension:  - blood pressures worse while on midodrine, will CTM - may need to discuss with Cardiology if Bps worsen, currently holding coreg 12.5mg  BID  Stage 2 pressure injury - wound care per RN  Chronic conditions:   Constipation: Continue as needed laxatives  Generalized weakness: PT OT, continue to address orthostatic hypotension.  SNF planned  ILD - continue inhalers,IS,supplemental oxygen, persistent rales on exam  Hyperlipidemia: cont statin  Complete heart block with pacemaker in place. Review of telemetry suggests A-sense, V paced, stable.  Hemorrhoid: Cont Anusol  Overactive bladder: holding oxybutynin, patient is unsure that the medication has helped and can cause dizziness.  Diet Order             Diet Heart Room service appropriate? Yes; Fluid consistency: Thin  Diet effective now                   Nutrition Problem: Increased nutrient needs Etiology: acute illness Signs/Symptoms: estimated needs Interventions: Ensure Enlive (each supplement provides 350kcal and 20 grams of protein) Patient's Body mass index is 19.11 kg/m.  Pressure Injury 05/18/21 Coccyx Mid Stage 1 -  Intact skin  with non-blanchable redness of a localized area usually over a bony prominence. (Active)  05/18/21 1700  Location: Coccyx  Location Orientation: Mid  Staging: Stage 1 -  Intact skin with non-blanchable redness of a  localized area usually over a bony prominence.  Wound Description (Comments):   Present on Admission: Yes     Pressure Injury 05/18/21 Buttocks Left Stage 2 -  Partial thickness loss of dermis presenting as a shallow open injury with a red, pink wound bed without slough. (Active)  05/18/21 1700  Location: Buttocks  Location Orientation: Left  Staging: Stage 2 -  Partial thickness loss of dermis presenting as a shallow open injury with a red, pink wound bed without slough.  Wound Description (Comments):   Present on Admission: Yes   DVT prophylaxis: Place TED hose Start: 06/01/21 0811 enoxaparin (LOVENOX) injection 40 mg Start: 05/18/21 1000 Code Status:   Code Status: DNR  Family Communication: plan of care discussed with patient at bedside    Status is: Inpatient Remains inpatient appropriate because:Inpatient level of care appropriate due to severity of illness Dispo: The patient is from: Home              Anticipated d/c is to: SNF once orthostatic vitals improves and bed available. Hopefully 1-2 days              Patient currently NOT medically stable for discharge.   Difficult to place patient No  Unresulted Labs (From admission, onward)     Start     Ordered   06/02/21 0500  Hemoglobin A1c  Tomorrow morning,   R       Question:  Specimen collection method  Answer:  Lab=Lab collect   06/01/21 0754           Medications reviewed:  Scheduled Meds:  arformoterol  15 mcg Nebulization BID   And   umeclidinium bromide  1 puff Inhalation Daily   And   budesonide (PULMICORT) nebulizer solution  1 mg Nebulization BID   aspirin EC  81 mg Oral Daily   docusate sodium  100 mg Oral BID   enoxaparin (LOVENOX) injection  40 mg Subcutaneous Q24H   feeding supplement  237 mL Oral BID BM   gabapentin  100 mg Oral QHS   hydrocortisone  25 mg Rectal BID   magnesium gluconate  500 mg Oral QHS   midodrine  10 mg Oral TID WC   pantoprazole  20 mg Oral Daily   polyethylene glycol  17  g Oral Daily   rOPINIRole  0.25 mg Oral QHS   rosuvastatin  10 mg Oral QHS   vitamin B-12  5,000 mcg Oral Daily   Continuous Infusions: discontinued    Consultants: Cardiology Procedures:see note Antimicrobials: Anti-infectives (From admission, onward)    Start     Dose/Rate Route Frequency Ordered Stop   05/19/21 0100  cefTRIAXone (ROCEPHIN) 1 g in sodium chloride 0.9 % 100 mL IVPB        1 g 200 mL/hr over 30 Minutes Intravenous Every 24 hours 05/18/21 0341 05/24/21 0057   05/18/21 0400  azithromycin (ZITHROMAX) 500 mg in sodium chloride 0.9 % 250 mL IVPB  Status:  Discontinued        500 mg 250 mL/hr over 60 Minutes Intravenous Every 24 hours 05/18/21 0341 05/23/21 0716   05/17/21 2345  cefTRIAXone (ROCEPHIN) 1 g in sodium chloride 0.9 % 100 mL IVPB        1 g 200 mL/hr  over 30 Minutes Intravenous  Once 05/17/21 2333 05/18/21 0116      Culture/Microbiology    Component Value Date/Time   SDES  05/18/2021 0541    URINE, RANDOM Performed at [facility], [address], [place], [place] [address]    SPECREQUEST  05/18/2021 0541    NONE Performed at [facility], [address] [place], [place] [address]    CULT (A) 05/18/2021 0541    <10,000 COLONIES/mL INSIGNIFICANT GROWTH Performed at [facility], [address] [place], [place] [address]    REPTSTATUS 05/19/2021 FINAL 05/18/2021 0541    Other culture-see note  Objective: Vitals: Today's Vitals   06/02/21 0530 06/02/21 0805 06/02/21 1250 06/02/21 1327  BP: (!) 155/88     Pulse: 75     Resp: 20     Temp: 98 F (36.7 C)     TempSrc:      SpO2: 98% 96%    Weight:      Height:      PainSc:   5  Asleep    Intake/Output Summary (Last 24 hours) at 06/02/2021 1545 Last data filed at 06/02/2021 1400 Gross per 24 hour  Intake 2725.46 ml  Output 2050 ml  Net 675.46 ml    Filed Weights   05/30/21 0417 05/31/21 0431 06/01/21 0500  Weight: 57.2 kg 58.3 kg 57 kg    Weight change:   Intake/Output from previous day: 07/23 0701 - 07/24 0700 In: 2725.5 [P.O.:480; I.V.:2245.5] Out: 2750 [Urine:2750] Intake/Output this shift: Total I/O In: 360 [P.O.:360] Out: 700 [Urine:700] Filed Weights   05/30/21 0417 05/31/21 0431 06/01/21 0500  Weight: 57.2 kg 58.3 kg 57 kg   Examination: General exam: elderly gentleman, hard of hearing  HEENT: Oral mucosa moist, Ear/Nose WNL grossly, dentition normal. Respiratory system: bilateral basilar crackles (appears to be chronic), no use of accessory muscle Cardiovascular system: S1 & S2 +, JVP at clavicle at 45 deg Gastrointestinal system: Abdomen soft, NT, ND, BS+ Nervous System:Alert, awake, moving extremities and grossly nonfocal Extremities: no edema, distal peripheral pulses palpable.  Skin: No rashes,no icterus. MSK: Normal muscle bulk,tone, power    Data Reviewed: I have personally reviewed following labs and imaging studies CBC: No results for input(s): WBC, NEUTROABS, HGB, HCT, MCV, PLT in the last 168 hours. Basic Metabolic Panel: Recent Labs  Lab 05/27/21 1122 06/02/21 0521  NA 138 139  K 3.7 4.1  CL 95* 104  CO2 33* 28  GLUCOSE 161* 100*  BUN 35* 17  CREATININE 0.88 0.64  CALCIUM 8.8* 8.5*   GFR: Estimated Creatinine Clearance: 45.5 mL/min (by C-G formula based on SCr of 0.64 mg/dL). Liver Function Tests: Recent Labs  Lab 06/02/21 0521  AST 25  ALT 27  ALKPHOS 53  BILITOT 0.6  PROT 6.2*  ALBUMIN 2.6*    No results for input(s): LIPASE, AMYLASE in the last 168 hours. No results for input(s): AMMONIA in the last 168 hours. Coagulation Profile: No results for input(s): INR, PROTIME in the last 168 hours.  Cardiac Enzymes: No results for input(s): CKTOTAL, CKMB, CKMBINDEX, TROPONINI in the last 168 hours. BNP (last 3 results) No results for input(s): PROBNP in the last 8760 hours. HbA1C: No results for input(s): HGBA1C in the last 72 hours. CBG: No results for input(s):  GLUCAP in the last 168 hours. Lipid Profile: No results for input(s): CHOL, HDL, LDLCALC, TRIG, CHOLHDL, LDLDIRECT in the last 72 hours. Thyroid Function Tests: No results for input(s):  TSH, T4TOTAL, FREET4, T3FREE, THYROIDAB in the last 72 hours. Anemia Panel: No results for input(s): VITAMINB12, FOLATE, FERRITIN, TIBC, IRON, RETICCTPCT in the last 72 hours. Sepsis Labs: No results for input(s): PROCALCITON, LATICACIDVEN in the last 168 hours.  Recent Results (from the past 240 hour(s))  SARS CORONAVIRUS 2 (TAT 6-24 HRS) Nasopharyngeal Nasopharyngeal Swab     Status: None   Collection Time: 05/29/21  5:25 PM   Specimen: Nasopharyngeal Swab  Result Value Ref Range Status   SARS Coronavirus 2 NEGATIVE NEGATIVE Final    Comment: (NOTE) SARS-CoV-2 target nucleic acids are NOT DETECTED.  The SARS-CoV-2 RNA is generally detectable in upper and lower respiratory specimens during the acute phase of infection. Negative results do not preclude SARS-CoV-2 infection, do not rule out co-infections with other pathogens, and should not be used as the sole basis for treatment or other patient management decisions. Negative results must be combined with clinical observations, patient history, and epidemiological information. The expected result is Negative.  Fact Sheet for Patients: HairSlick.no  Fact Sheet for Healthcare Providers: quierodirigir.com  This test is not yet approved or cleared by the [place] FDA and  has been authorized for detection and/or diagnosis of SARS-CoV-2 by FDA under an Emergency Use Authorization (EUA). This EUA will remain  in effect (meaning this test can be used) for the duration of the COVID-19 declaration under Se ction 564(b)(1) of the Act, 21 U.S.C. section 360bbb-3(b)(1), unless the authorization is terminated or revoked sooner.  Performed at [facility], [address] [place],  [place] [address]      Radiology Studies: No results found.   LOS: 15 days   Rachael Fee, MD MPH [facility]  06/02/2021, 3:45 PM

## 2021-06-02 NOTE — Progress Notes (Signed)
Progress Note  Patient Name: Justin Richmond Date of Encounter: 06/02/2021  [facility] Cardiologist: None  Electrophysiologist: Justin MangesSteven Klein, MD   Subjective   Added abdominal binder and changed to thigh high compression hose yesterday as well as changed timing of midodrine and orthostatic BPs improved.   Inpatient Medications    Scheduled Meds:  arformoterol  15 mcg Nebulization BID   And   umeclidinium bromide  1 puff Inhalation Daily   And   budesonide (PULMICORT) nebulizer solution  1 mg Nebulization BID   aspirin EC  81 mg Oral Daily   docusate sodium  100 mg Oral BID   enoxaparin (LOVENOX) injection  40 mg Subcutaneous Q24H   feeding supplement  237 mL Oral BID BM   fludrocortisone  0.05 mg Oral Daily   gabapentin  100 mg Oral QHS   hydrocortisone  25 mg Rectal BID   magnesium gluconate  500 mg Oral QHS   midodrine  10 mg Oral TID WC   pantoprazole  20 mg Oral Daily   polyethylene glycol  17 g Oral Daily   rOPINIRole  0.25 mg Oral QHS   rosuvastatin  10 mg Oral QHS   vitamin B-12  5,000 mcg Oral Daily   Continuous Infusions:   PRN Meds: acetaminophen **OR** acetaminophen, albuterol, lip balm, witch hazel-glycerin   Vital Signs    Vitals:   06/01/21 1319 06/01/21 2007 06/01/21 2226 06/02/21 0530  BP: (!) 186/98  (!) 143/73 (!) 155/88  Pulse: 86  84 75  Resp: 18  20 20   Temp: 98.1 F (36.7 C)  98.2 F (36.8 C) 98 F (36.7 C)  TempSrc: Oral  Oral   SpO2: 98% 95% 95% 98%  Weight:      Height:        Intake/Output Summary (Last 24 hours) at 06/02/2021 0744 Last data filed at 06/02/2021 0615 Gross per 24 hour  Intake 2725.46 ml  Output 2750 ml  Net -24.54 ml    Last 3 Weights 06/01/2021 05/31/2021 05/30/2021  Weight (lbs) 125 lb 10.6 oz 128 lb 8.5 oz 126 lb 1.7 oz  Weight (kg) 57 kg 58.3 kg 57.2 kg     Orthostatic VS for the past 24 hrs (Last 3 readings):  BP- Lying Pulse- Lying BP- Sitting Pulse- Sitting BP- Standing at 0 minutes Pulse-  Standing at 0 minutes  06/01/21 0754 153/80 87 129/66 94 110/76 101      Telemetry    NSR with V pacing Personally Reviewed  ECG    None today- Personally Reviewed  Physical Exam   GEN: Well nourished, well developed in no acute distress HEENT: Normal NECK: No JVD; No carotid bruits LYMPHATICS: No lymphadenopathy CARDIAC:RRR, no murmurs, rubs, gallops RESPIRATORY:  Clear to auscultation without rales, wheezing or rhonchi  ABDOMEN: Soft, non-tender, non-distended MUSCULOSKELETAL:  No edema; No deformity  SKIN: Warm and dry NEUROLOGIC:  Alert and oriented x 3 PSYCHIATRIC:  Normal affect   Labs    High Sensitivity Troponin:  No results for input(s): TROPONINIHS in the last 720 hours.    Chemistry Recent Labs  Lab 05/27/21 1122 06/02/21 0521  NA 138 139  K 3.7 4.1  CL 95* 104  CO2 33* 28  GLUCOSE 161* 100*  BUN 35* 17  CREATININE 0.88 0.64  CALCIUM 8.8* 8.5*  PROT  --  6.2*  ALBUMIN  --  2.6*  AST  --  25  ALT  --  27  ALKPHOS  --  53  BILITOT  --  0.6  GFRNONAA >60 >60  ANIONGAP 10 7      Hematology No results for input(s): WBC, RBC, HGB, HCT, MCV, MCH, MCHC, RDW, PLT in the last 168 hours.   BNPNo results for input(s): BNP, PROBNP in the last 168 hours.   DDimer No results for input(s): DDIMER in the last 168 hours.   Radiology    DG Chest 2 View  Result Date: 05/17/2021 CLINICAL DATA:  Pt arrived via EMS from home with [facility]. Pt has wet cough. Pmhx of pacemaker and urinary retention. Pt states he has had urinary retention for 3 days. Pt denies chest pain, and has a hx of HTN, and CHF. EXAM: CHEST - 2 VIEW COMPARISON:  Chest x-ray 03/10/2021, CT chest 03/14/2021 FINDINGS: The heart size and mediastinal contours are unchanged. Aortic calcification. Left chest wall dual lead cardiac pacemaker in similar position. Biapical pleural/pulmonary scarring. Interval increase in diffuse patchy airspace opacities. Similar appearing coarsened interstitial markings.  Possible trace left pleural effusion. No right pleural effusion. No pneumothorax. No acute osseous abnormality. IMPRESSION: 1. Chronic interstitial lung disease with superimposed infection/inflammation. 2. Possible trace left pleural effusion Electronically Signed   By: Justin Richmond M.D.   On: 05/17/2021 23:19   DG Chest Port 1 View  Result Date: 05/24/2021 CLINICAL DATA:  CHF. EXAM: PORTABLE CHEST 1 VIEW COMPARISON:  05/19/2021. FINDINGS: Chronic coarsened interstitial markings with improved superimposed patchy bilateral airspace opacities. Chronic interstitial lung disease was better characterized on prior CT chest from Mar 14, 2021. No visible pleural effusions or pneumothorax on this single semi erect AP radiograph. Cardiac silhouette is largely obscured. Aortic atherosclerosis. Left subclavian approach dual lead cardiac rhythm maintenance device in similar position. Polyarticular degenerative change. IMPRESSION: Findings suggestive of improving multifocal pneumonia and/or edema superimposed on chronic interstitial lung disease. Electronically Signed   By: Justin Harts MD   On: 05/24/2021 07:48   DG CHEST PORT 1 VIEW  Result Date: 05/19/2021 CLINICAL DATA:  Shortness of breath, cough, fever and chills. EXAM: PORTABLE CHEST 1 VIEW COMPARISON:  Chest x-rays dated 05/17/2021 and 03/10/2021. FINDINGS: Ill-defined airspace opacities bilaterally, likely superimposed on chronic interstitial lung disease. No pleural effusion or pneumothorax is seen. Heart size and mediastinal contours are grossly stable. LEFT chest wall pacemaker/ICD hardware in place. IMPRESSION: Multifocal pneumonia superimposed on chronic interstitial lung disease, not significantly changed compared to chest x-ray of 05/17/2021. Electronically Signed   By: Justin Richmond M.D.   On: 05/19/2021 08:34   ECHOCARDIOGRAM COMPLETE  Result Date: 05/18/2021    ECHOCARDIOGRAM REPORT   Patient Name:   Justin Richmond Date of Exam: 05/18/2021  Medical Rec #:  [id number]        Height:       65.0 in Accession #:    [id number]       Weight:       133.6 lb Date of Birth:  [birth year]        BSA:          1.666 m Patient Age:    87 years         BP:           147/84 mmHg Patient Gender: M                HR:           73 bpm. Exam Location:  Inpatient Procedure: 2D Echo, Cardiac Doppler and Color Doppler Indications:    CHF  History:  Patient has prior history of Echocardiogram examinations, most                 recent 06/22/2018. CHF, Pacemaker, Signs/Symptoms:Shortness of                 Breath; Risk Factors:Dyslipidemia. 06/01/2017 pacemaker.  Sonographer:    Neomia Dear RDCS Referring Phys: [id number] VASUNDHRA RATHORE IMPRESSIONS  1. Left ventricular ejection fraction, by estimation, is 50 to 55%. The left ventricle has low normal function. The left ventricle has no regional wall motion abnormalities. Left ventricular diastolic parameters are consistent with Grade I diastolic dysfunction (impaired relaxation).  2. Right ventricular systolic function is normal. The right ventricular size is normal. There is mildly elevated pulmonary artery systolic pressure.  3. The mitral valve is normal in structure. Mild mitral valve regurgitation.  4. The aortic valve is tricuspid. Aortic valve regurgitation is trivial.  5. The inferior vena cava is normal in size with greater than 50% respiratory variability, suggesting right atrial pressure of 3 mmHg. FINDINGS  Left Ventricle: There is substantial septal-lateral and apex-to-base dyssynchrony. Left ventricular ejection fraction, by estimation, is 50 to 55%. The left ventricle has low normal function. The left ventricle has no regional wall motion abnormalities.  The left ventricular internal cavity size was normal in size. There is no left ventricular hypertrophy. Abnormal (paradoxical) septal motion, consistent with RV pacemaker. Left ventricular diastolic parameters are consistent with Grade I diastolic dysfunction  (impaired relaxation). Normal left ventricular filling pressure. Right Ventricle: The right ventricular size is normal. No increase in right ventricular wall thickness. Right ventricular systolic function is normal. There is mildly elevated pulmonary artery systolic pressure. The tricuspid regurgitant velocity is 2.96  m/s, and with an assumed right atrial pressure of 3 mmHg, the estimated right ventricular systolic pressure is 38.0 mmHg. Left Atrium: Left atrial size was normal in size. Right Atrium: Right atrial size was normal in size. Pericardium: There is no evidence of pericardial effusion. Mitral Valve: The mitral valve is normal in structure. Mild mitral valve regurgitation. Tricuspid Valve: The tricuspid valve is normal in structure. Tricuspid valve regurgitation is mild. Aortic Valve: The aortic valve is tricuspid. Aortic valve regurgitation is trivial. Aortic regurgitation PHT measures 479 msec. Aortic valve mean gradient measures 6.0 mmHg. Aortic valve peak gradient measures 11.6 mmHg. Aortic valve area, by VTI measures 2.39 cm. Pulmonic Valve: The pulmonic valve was not well visualized. Pulmonic valve regurgitation is not visualized. Aorta: The aortic root and ascending aorta are structurally normal, with no evidence of dilitation. Venous: The inferior vena cava is normal in size with greater than 50% respiratory variability, suggesting right atrial pressure of 3 mmHg. IAS/Shunts: No atrial level shunt detected by color flow Doppler. Additional Comments: A device lead is visualized in the right ventricle.  LEFT VENTRICLE PLAX 2D LVIDd:         4.30 cm     Diastology LVIDs:         4.00 cm     LV e' medial:    3.70 cm/s LV PW:         1.20 cm     LV E/e' medial:  12.4 LV IVS:        1.10 cm     LV e' lateral:   15.90 cm/s LVOT diam:     2.10 cm     LV E/e' lateral: 2.9 LV SV:         76 LV SV Index:   46 LVOT  Area:     3.46 cm  LV Volumes (MOD) LV vol d, MOD A2C: 83.6 ml LV vol d, MOD A4C: 51.1 ml LV  vol s, MOD A2C: 47.6 ml LV vol s, MOD A4C: 35.2 ml LV SV MOD A2C:     36.0 ml LV SV MOD A4C:     51.1 ml LV SV MOD BP:      23.9 ml RIGHT VENTRICLE RV Basal diam:  3.50 cm RV Mid diam:    2.50 cm RV S prime:     16.20 cm/s TAPSE (M-mode): 1.7 cm LEFT ATRIUM           Index       RIGHT ATRIUM           Index LA Vol (A2C): 29.3 ml 17.58 ml/m RA Area:     14.80 cm LA Vol (A4C): 28.0 ml 16.80 ml/m RA Volume:   33.10 ml  19.86 ml/m  AORTIC VALVE                    PULMONIC VALVE AV Area (Vmax):    2.26 cm     PV Vmax:       0.67 m/s AV Area (Vmean):   2.24 cm     PV Vmean:      47.700 cm/s AV Area (VTI):     2.39 cm     PV VTI:        0.147 m AV Vmax:           170.00 cm/s  PV Peak grad:  1.8 mmHg AV Vmean:          114.000 cm/s PV Mean grad:  1.0 mmHg AV VTI:            0.317 m AV Peak Grad:      11.6 mmHg AV Mean Grad:      6.0 mmHg LVOT Vmax:         111.00 cm/s LVOT Vmean:        73.800 cm/s LVOT VTI:          0.219 m LVOT/AV VTI ratio: 0.69 AI PHT:            479 msec  AORTA Ao Root diam: 3.40 cm Ao Asc diam:  3.20 cm MITRAL VALVE                TRICUSPID VALVE MV Area (PHT): 4.96 cm     TR Peak grad:   35.0 mmHg MV Decel Time: 153 msec     TR Vmax:        296.00 cm/s MR Peak grad: 86.1 mmHg MR Vmax:      464.00 cm/s   SHUNTS MV E velocity: 46.00 cm/s   Systemic VTI:  0.22 m MV A velocity: 110.00 cm/s  Systemic Diam: 2.10 cm MV E/A ratio:  0.42 Mihai Croitoru MD Electronically signed by Thurmon Fair MD Signature Date/Time: 05/18/2021/1:51:29 PM    Final     Cardiac Studies   ECHO: 05/18/2021  1. Left ventricular ejection fraction, by estimation, is 50 to 55%. The left ventricle has low normal function. The left ventricle has no regional wall motion abnormalities. Left ventricular diastolic parameters are consistent with Grade I diastolic dysfunction (impaired relaxation).   2. Right ventricular systolic function is normal. The right ventricular size is normal. There is mildly elevated pulmonary artery  systolic pressure.  RVSP 38  3. The mitral valve is normal in structure. Mild  mitral valve  regurgitation.   4. The aortic valve is tricuspid. Aortic valve regurgitation is trivial.  Peak gradient 11.6 mmHg  5. The inferior vena cava is normal in size with greater than 50%  respiratory variability, suggesting right atrial pressure of 3 mmHg.  Patient Profile     87 y.o. male with a hx of syncope, CHB w/p BSci PPM, spinal stenosis with b/l neuropathy, COVID 10/2020, ILD (seen by Dr Isaiah Serge), HTN, HLD, HOH, who is being seen 05/30/2021 for orthostatic hypotension.  He was admitted on 05/17/2021 with low-grade fever, O2 saturation in the upper 70s, cough and shortness of breath.  He was diagnosed with pneumonia, COVID-negative.     He was volume overloaded and given Lasix twice.  He was noted to have orthostatic hypotension on 7/15.  Lasix was held.  He was started on midodrine.     He remained hypoxic and it was felt he would need to be discharged on home O2.  He completed antibiotics for the pneumonia.  Cosyntropin stim test was within normal limits. Procalcitonin was negative and he was weaned off steroids.     On 7/19, he was given a 500 cc bolus for the orthostatic hypotension.  He had on compression stockings and the midodrine was increased.  He has been off carvedilol since admit. Cards asked to see 07/21 for unresolved orthostatic hypotension w/ SBP 80s when standing.   Assessment & Plan    Orthostatic Hypotension - abdominal binder was added yesterday and changed knee high to thigh high hose as well and changed timing of Midodrine with improvement in orthostatic BPs. - am cortisol level normal  - dizziness has improved - continue Midodrine  q8am, 12Noon and 3pm - Florinef was started by [facility] yesterday  and BP elevated overnight>>will stop given that it can exacerbate fluid retention  2.  Acute diastolic CHF - He was felt volume overloaded on arrival and got a total of 80 mg of Lasix  over the first 2 days - The orthostatic hypotension then became pronounced and the diuretic was discontinued - he was given 1L NS  - labs stable today - weight down another 3 lbs today without diuretics -His lung sounds will always be abnormal because of his chronic interstitial lung disease  Otherwise, per IM  For questions or updates, please contact [facility] Please consult [email or web address] for contact info under        Signed, Armanda Magic, MD  06/02/2021, 7:44 AM

## 2021-06-03 ENCOUNTER — Telehealth: Payer: Self-pay | Admitting: Internal Medicine

## 2021-06-03 DIAGNOSIS — I951 Orthostatic hypotension: Secondary | ICD-10-CM | POA: Diagnosis not present

## 2021-06-03 DIAGNOSIS — A419 Sepsis, unspecified organism: Secondary | ICD-10-CM | POA: Diagnosis not present

## 2021-06-03 DIAGNOSIS — R6520 Severe sepsis without septic shock: Secondary | ICD-10-CM | POA: Diagnosis not present

## 2021-06-03 DIAGNOSIS — J9601 Acute respiratory failure with hypoxia: Secondary | ICD-10-CM | POA: Diagnosis not present

## 2021-06-03 LAB — HEMOGLOBIN A1C
Hgb A1c MFr Bld: 6.4 % — ABNORMAL HIGH (ref 4.8–5.6)
Mean Plasma Glucose: 137 mg/dL

## 2021-06-03 LAB — RESP PANEL BY RT-PCR (FLU A&B, COVID) ARPGX2
Influenza A by PCR: NEGATIVE
Influenza B by PCR: NEGATIVE
SARS Coronavirus 2 by RT PCR: NEGATIVE

## 2021-06-03 MED ORDER — MIDODRINE HCL 10 MG PO TABS: 10.0000 mg | ORAL_TABLET | Freq: Three times a day (TID) | ORAL | Status: AC

## 2021-06-03 MED ORDER — HYDROCORTISONE ACETATE 25 MG RE SUPP: 25.0000 mg | Freq: Two times a day (BID) | RECTAL | 0 refills | Status: AC

## 2021-06-03 NOTE — Discharge Summary (Addendum)
Physician Discharge Summary  Justin Richmond WUJ:[id number] DOB: 02-21-1924 DOA: 05/17/2021  PCP: Johny Blamer, MD  Admit date: 05/17/2021 Discharge date: 06/03/2021  Admitted From: Home Disposition: Skilled nursing facility  Recommendations for Outpatient Follow-up:  Follow up with PCP in 1-2 weeks Please obtain BMP/CBC/magnesium/phosphorus in one week Continue to wear compression stockings and abdominal binder.  All-time fall precautions.  Orthostatic precautions. Patient can use condom catheter at the skilled rehab when under nursing supervision.      Patient's family can bring condom catheter supplies and nursing staff can use it and provide care as per facility policy.   Home Health: Not applicable Equipment/Devices: Not applicable  Discharge Condition: Fair CODE STATUS: DNR Diet recommendation: Regular diet.  Discharge summary: 87 year old gentleman with history of interstitial lung disease, complete heart block status post pacemaker, chronic diastolic congestive heart failure, hypertension hyperlipidemia, overactive bladder and recurrent UTI presented to the emergency room with cough, shortness of breath fever and chills for 3 days.  In the emergency room he was admitted as severe sepsis due to community-acquired pneumonia and acute hypoxemic respiratory failure.  He was treated with IV antibiotics and intermittent Lasix.  Hospital course complicated with profound orthostatic hypotension, dizziness and lightheadedness and ambulatory dysfunction.  Treated for following conditions.  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  Extensive work-up including cosyntropin test was negative. Started on midodrine 10 mg 3 times daily, thigh-high compression stockings and abdominal binder with improvement of symptoms.  Some drop in  orthostatic blood pressures but symptoms improving. To avoid severe drop in blood pressures, holding off on antihypertensives. Plan: All-time orthostatic precautions.  Thigh-high compression stockings and abdominal binder before mobilizing.  Midodrine.   Severe sepsis present on admission due to community-acquired pneumonia with hypoxemia: Improved.  On room air.  Completed antibiotic therapy.  Chronic diastolic congestive heart failure: Patient intermittently received Lasix with good diuresis.  He is euvolemic.  He has some crackles in the lungs due to interstitial lung disease.  Echocardiogram with ejection Horseman 50 to 55%.  Holding of diuretics because he is euvolemic and diuretics causing more orthostatic changes.  Essential hypertension: accepting high blood pressures to avoid orthostatic drop.  Coreg discontinued.  Stage II pressure injury present on admission: Local wound care.  Instructions attached.  Deconditioning and debility: Will need aggressive PT OT.  Refer to inpatient therapies at a skilled nursing facility.  Interstitial lung disease: Stable on bronchodilator therapy.  Complete heart block status post pacemaker: Stable with pacemaker in place.  Hemorrhoids: Steroid suppository.  Chronically sick with multiple medical issues but medically stable.  Can transfer to a skilled nursing facility today to continue inpatient therapies.    Discharge Diagnoses:  Principal Problem:   Sepsis with acute hypoxic respiratory failure without septic shock (HCC) Active Problems:   ILD (interstitial lung disease) (HCC)   CAP (community acquired pneumonia)   Acute hypoxemic respiratory failure (HCC)   CHF exacerbation (HCC)   Pressure injury of skin   Pressure injury of buttock, stage 2 (HCC)   Hemorrhoid   Orthostatic hypotension    Discharge Instructions  Discharge Instructions     Diet general   Complete by: As directed    Discharge instructions   Complete by: As  directed    Thigh-high compression stockings to wear mostly at the daytime. With abdominal binders in the daytime and before mobilizing. All-time fall precautions.  Orthostatic precautions.  Can use condom catheter at SNF   Increase activity slowly   Complete by: As directed    No wound care   Complete by: As directed       Allergies as of 06/03/2021       Reactions   Penicillins    Unknown reaction, "it's been a long time".        Medication List     STOP taking these medications    carvedilol 12.5 MG tablet Commonly known as: COREG   ibuprofen 200 MG tablet Commonly known as: ADVIL   oxybutynin 5 MG tablet Commonly known as: DITROPAN       TAKE these medications    aspirin EC 81 MG tablet Take 81 mg by mouth in the morning and at bedtime.   Breztri Aerosphere 160-9-4.8 MCG/ACT Aero Generic drug: Budeson-Glycopyrrol-Formoterol Inhale 2 puffs into the lungs 2 (two) times daily. What changed:  when to take this reasons to take this   Co Q10 100 MG Caps Take 100 mg by mouth daily.   Digestive Adv Digestive/Immune Caps Take 1 capsule by mouth daily.   esomeprazole 20 MG capsule Commonly known as: NEXIUM Take 20 mg by mouth daily at 12 noon.   gabapentin 100 MG capsule Commonly known as: NEURONTIN Take 100 mg by mouth at bedtime.   Garlic 500 MG Caps Take 500 mg by mouth daily.   hydrocortisone 25 MG suppository Commonly known as: ANUSOL-HC Place 1 suppository (25 mg total) rectally 2 (two) times daily.   Magnesium Malate 1250 (141.7 Mg) MG Tabs Take 1,250 mg by mouth at bedtime.   midodrine 10 MG tablet Commonly known as: PROAMATINE Take 1 tablet (10 mg total) by mouth 3 (three) times daily with meals.   rOPINIRole 0.25 MG tablet Commonly known as: REQUIP Take 0.25 mg by mouth at bedtime.   rosuvastatin 10 MG tablet Commonly known as: CRESTOR Take 10 mg by mouth daily.   Turmeric 450 MG Caps Take 450 mg by mouth daily.   Vitamin A  7.5 MG (25000 UT) Caps Take 25,000 Units by mouth daily.   Vitamin B-12 5000 MCG Subl Place 5,000 mcg under the tongue daily.   VITAMIN K2-VITAMIN D3 PO Take 1 capsule by mouth daily.        Contact information for follow-up providers     [facility] Follow up.   Specialty: Home Health Services [facility] Contact information: [address] [place] [address] [phone]              Contact information for after-discharge care     Destination     [facility] .   Service: Skilled Nursing Contact information: [address] [place] [id number] [phone]                    Allergies  Allergen Reactions   Penicillins     Unknown reaction, "it's been a long time".    Consultations: Cardiology   Procedures/Studies: DG Chest 2 View  Result Date: 05/17/2021 CLINICAL DATA:  [facility] Pt has wet cough. Pmhx of pacemaker and urinary retention. Pt states he has had urinary retention for 3 days. Pt denies chest pain, and has a hx of HTN, and CHF. EXAM: CHEST - 2 VIEW COMPARISON:  Chest x-ray 03/10/2021, CT chest 03/14/2021 FINDINGS: The heart size and mediastinal contours are unchanged. Aortic calcification. Left chest  wall dual lead cardiac pacemaker in similar position. Biapical pleural/pulmonary scarring. Interval increase in diffuse patchy airspace opacities. Similar appearing coarsened interstitial markings. Possible trace left pleural effusion. No right pleural effusion. No pneumothorax. No acute osseous abnormality. IMPRESSION: 1. Chronic interstitial lung disease with superimposed infection/inflammation. 2. Possible trace left pleural effusion Electronically Signed   By: Tish Frederickson M.D.   On: 05/17/2021 23:19   DG Chest Port 1 View  Result Date: 05/24/2021 CLINICAL DATA:  CHF. EXAM: PORTABLE CHEST 1 VIEW COMPARISON:  05/19/2021. FINDINGS:  Chronic coarsened interstitial markings with improved superimposed patchy bilateral airspace opacities. Chronic interstitial lung disease was better characterized on prior CT chest from Mar 14, 2021. No visible pleural effusions or pneumothorax on this single semi erect AP radiograph. Cardiac silhouette is largely obscured. Aortic atherosclerosis. Left subclavian approach dual lead cardiac rhythm maintenance device in similar position. Polyarticular degenerative change. IMPRESSION: Findings suggestive of improving multifocal pneumonia and/or edema superimposed on chronic interstitial lung disease. Electronically Signed   By: Feliberto Harts MD   On: 05/24/2021 07:48   DG CHEST PORT 1 VIEW  Result Date: 05/19/2021 CLINICAL DATA:  Shortness of breath, cough, fever and chills. EXAM: PORTABLE CHEST 1 VIEW COMPARISON:  Chest x-rays dated 05/17/2021 and 03/10/2021. FINDINGS: Ill-defined airspace opacities bilaterally, likely superimposed on chronic interstitial lung disease. No pleural effusion or pneumothorax is seen. Heart size and mediastinal contours are grossly stable. LEFT chest wall pacemaker/ICD hardware in place. IMPRESSION: Multifocal pneumonia superimposed on chronic interstitial lung disease, not significantly changed compared to chest x-ray of 05/17/2021. Electronically Signed   By: Bary Richard M.D.   On: 05/19/2021 08:34   ECHOCARDIOGRAM COMPLETE  Result Date: 05/18/2021    ECHOCARDIOGRAM REPORT   Patient Name:   OBRYAN RADU Date of Exam: 05/18/2021 Medical Rec #:  [id number]        Height:       65.0 in Accession #:    [id number]       Weight:       133.6 lb Date of Birth:  Apr 14, 1924        BSA:          1.666 m Patient Age:    87 years         BP:           147/84 mmHg Patient Gender: M                HR:           73 bpm. Exam Location:  Inpatient Procedure: 2D Echo, Cardiac Doppler and Color Doppler Indications:    CHF  History:        Patient has prior history of Echocardiogram  examinations, most                 recent 06/22/2018. CHF, Pacemaker, Signs/Symptoms:Shortness of                 Breath; Risk Factors:Dyslipidemia. 06/01/2017 pacemaker.  Sonographer:    Neomia Dear RDCS Referring Phys: [id number] VASUNDHRA RATHORE IMPRESSIONS  1. Left ventricular ejection fraction, by estimation, is 50 to 55%. The left ventricle has low normal function. The left ventricle has no regional wall motion abnormalities. Left ventricular diastolic parameters are consistent with Grade I diastolic dysfunction (impaired relaxation).  2. Right ventricular systolic function is normal. The right ventricular size is normal. There is mildly elevated pulmonary artery systolic pressure.  3. The mitral valve is normal in structure. Mild mitral valve regurgitation.  4. The aortic valve is tricuspid. Aortic valve regurgitation is trivial.  5. The inferior vena cava is normal in size with greater than 50% respiratory variability, suggesting right atrial pressure of 3 mmHg. FINDINGS  Left Ventricle: There is substantial septal-lateral and apex-to-base dyssynchrony. Left ventricular ejection fraction, by estimation, is 50 to 55%. The left ventricle has low normal function. The left ventricle has no regional wall motion abnormalities.  The left ventricular internal cavity size was normal in size. There is no left ventricular hypertrophy. Abnormal (paradoxical) septal motion, consistent with RV pacemaker. Left ventricular diastolic parameters are consistent with Grade I diastolic dysfunction (impaired relaxation). Normal left ventricular filling pressure. Right Ventricle: The right ventricular size is normal. No increase in right ventricular wall thickness. Right ventricular systolic function is normal. There is mildly elevated pulmonary artery systolic pressure. The tricuspid regurgitant velocity is 2.96  m/s, and with an assumed right atrial pressure of 3 mmHg, the estimated right ventricular systolic pressure is 38.0 mmHg.  Left Atrium: Left atrial size was normal in size. Right Atrium: Right atrial size was normal in size. Pericardium: There is no evidence of pericardial effusion. Mitral Valve: The mitral valve is normal in structure. Mild mitral valve regurgitation. Tricuspid Valve: The tricuspid valve is normal in structure. Tricuspid valve regurgitation is mild. Aortic Valve: The aortic valve is tricuspid. Aortic valve regurgitation is trivial. Aortic regurgitation PHT measures 479 msec. Aortic valve mean gradient measures 6.0 mmHg. Aortic valve peak gradient measures 11.6 mmHg. Aortic valve area, by VTI measures 2.39 cm. Pulmonic Valve: The pulmonic valve was not well visualized. Pulmonic valve regurgitation is not visualized. Aorta: The aortic root and ascending aorta are structurally normal, with no evidence of dilitation. Venous: The inferior vena cava is normal in size with greater than 50% respiratory variability, suggesting right atrial pressure of 3 mmHg. IAS/Shunts: No atrial level shunt detected by color flow Doppler. Additional Comments: A device lead is visualized in the right ventricle.  LEFT VENTRICLE PLAX 2D LVIDd:         4.30 cm     Diastology LVIDs:         4.00 cm     LV e' medial:    3.70 cm/s LV PW:         1.20 cm     LV E/e' medial:  12.4 LV IVS:        1.10 cm     LV e' lateral:   15.90 cm/s LVOT diam:     2.10 cm     LV E/e' lateral: 2.9 LV SV:         76 LV SV Index:   46 LVOT Area:     3.46 cm  LV Volumes (MOD) LV vol d, MOD A2C: 83.6 ml LV vol d, MOD A4C: 51.1 ml LV vol s, MOD A2C: 47.6 ml LV vol s, MOD A4C: 35.2 ml LV SV MOD A2C:     36.0 ml LV SV MOD A4C:     51.1 ml LV SV MOD BP:      23.9 ml RIGHT VENTRICLE RV Basal diam:  3.50 cm RV Mid diam:    2.50 cm RV S prime:     16.20 cm/s TAPSE (M-mode): 1.7 cm LEFT ATRIUM           Index       RIGHT ATRIUM           Index LA Vol (A2C): 29.3 ml 17.58 ml/m RA Area:  14.80 cm LA Vol (A4C): 28.0 ml 16.80 ml/m RA Volume:   33.10 ml  19.86 ml/m  AORTIC  VALVE                    PULMONIC VALVE AV Area (Vmax):    2.26 cm     PV Vmax:       0.67 m/s AV Area (Vmean):   2.24 cm     PV Vmean:      47.700 cm/s AV Area (VTI):     2.39 cm     PV VTI:        0.147 m AV Vmax:           170.00 cm/s  PV Peak grad:  1.8 mmHg AV Vmean:          114.000 cm/s PV Mean grad:  1.0 mmHg AV VTI:            0.317 m AV Peak Grad:      11.6 mmHg AV Mean Grad:      6.0 mmHg LVOT Vmax:         111.00 cm/s LVOT Vmean:        73.800 cm/s LVOT VTI:          0.219 m LVOT/AV VTI ratio: 0.69 AI PHT:            479 msec  AORTA Ao Root diam: 3.40 cm Ao Asc diam:  3.20 cm MITRAL VALVE                TRICUSPID VALVE MV Area (PHT): 4.96 cm     TR Peak grad:   35.0 mmHg MV Decel Time: 153 msec     TR Vmax:        296.00 cm/s MR Peak grad: 86.1 mmHg MR Vmax:      464.00 cm/s   SHUNTS MV E velocity: 46.00 cm/s   Systemic VTI:  0.22 m MV A velocity: 110.00 cm/s  Systemic Diam: 2.10 cm MV E/A ratio:  0.42 Mihai Croitoru MD Electronically signed by Thurmon Fair MD Signature Date/Time: 05/18/2021/1:51:29 PM    Final    (Echo, Carotid, EGD, Colonoscopy, ERCP)    Subjective: Patient seen and examined.  Poor historian.  Denies any complaints.  On room air.  I called and discussed with patient's daughter on the phone and updated her.  He was not wearing any stockings or abdominal binders.   Discharge Exam: Vitals:   06/03/21 0519 06/03/21 0807  BP:    Pulse: 88   Resp: 20   Temp: 97.9 F (36.6 C)   SpO2: 95% 94%   Vitals:   06/02/21 2040 06/02/21 2120 06/03/21 0519 06/03/21 0807  BP:  (!) 169/85    Pulse:  74 88   Resp:  20 20   Temp:  98 F (36.7 C) 97.9 F (36.6 C)   TempSrc:  Oral Oral   SpO2: 95% 93% 95% 94%  Weight:      Height:        General: Pt is alert, awake, frail and debilitated.  Appropriate for age. Cardiovascular: RRR, S1/S2 +, no rubs, no gallops, has pacemaker in place. Respiratory: CTA bilaterally, bilateral scattered crackles.  Not in any distress.  On room  air. Abdominal: Soft, NT, ND, bowel sounds + Extremities: no edema, no cyanosis Patient has shortening of the leg and fixed deformity of the left knee with previous surgery.    The results of significant diagnostics  from this hospitalization (including imaging, microbiology, ancillary and laboratory) are listed below for reference.     Microbiology: Recent Results (from the past 240 hour(s))  SARS CORONAVIRUS 2 (TAT 6-24 HRS) Nasopharyngeal Nasopharyngeal Swab     Status: None   Collection Time: 05/29/21  5:25 PM   Specimen: Nasopharyngeal Swab  Result Value Ref Range Status   SARS Coronavirus 2 NEGATIVE NEGATIVE Final    Comment: (NOTE) SARS-CoV-2 target nucleic acids are NOT DETECTED.  The SARS-CoV-2 RNA is generally detectable in upper and lower respiratory specimens during the acute phase of infection. Negative results do not preclude SARS-CoV-2 infection, do not rule out co-infections with other pathogens, and should not be used as the sole basis for treatment or other patient management decisions. Negative results must be combined with clinical observations, patient history, and epidemiological information. The expected result is Negative.  Fact Sheet for Patients: HairSlick.no  Fact Sheet for Healthcare Providers: quierodirigir.com  This test is not yet approved or cleared by the [place] FDA and  has been authorized for detection and/or diagnosis of SARS-CoV-2 by FDA under an Emergency Use Authorization (EUA). This EUA will remain  in effect (meaning this test can be used) for the duration of the COVID-19 declaration under Se ction 564(b)(1) of the Act, 21 U.S.C. section 360bbb-3(b)(1), unless the authorization is terminated or revoked sooner.  Performed at [facility], [address] [place], [place] [address]      Labs: BNP (last 3 results) Recent Labs    05/17/21 2244 05/21/21 0424  BNP  690.4* 407.9*   Basic Metabolic Panel: Recent Labs  Lab 06/02/21 0521  NA 139  K 4.1  CL 104  CO2 28  GLUCOSE 100*  BUN 17  CREATININE 0.64  CALCIUM 8.5*   Liver Function Tests: Recent Labs  Lab 06/02/21 0521  AST 25  ALT 27  ALKPHOS 53  BILITOT 0.6  PROT 6.2*  ALBUMIN 2.6*   No results for input(s): LIPASE, AMYLASE in the last 168 hours. No results for input(s): AMMONIA in the last 168 hours. CBC: No results for input(s): WBC, NEUTROABS, HGB, HCT, MCV, PLT in the last 168 hours. Cardiac Enzymes: No results for input(s): CKTOTAL, CKMB, CKMBINDEX, TROPONINI in the last 168 hours. BNP: Invalid input(s): POCBNP CBG: No results for input(s): GLUCAP in the last 168 hours. D-Dimer No results for input(s): DDIMER in the last 72 hours. Hgb A1c Recent Labs    06/02/21 0521  HGBA1C 6.4*   Lipid Profile No results for input(s): CHOL, HDL, LDLCALC, TRIG, CHOLHDL, LDLDIRECT in the last 72 hours. Thyroid function studies No results for input(s): TSH, T4TOTAL, T3FREE, THYROIDAB in the last 72 hours.  Invalid input(s): FREET3 Anemia work up No results for input(s): VITAMINB12, FOLATE, FERRITIN, TIBC, IRON, RETICCTPCT in the last 72 hours. Urinalysis    Component Value Date/Time   COLORURINE YELLOW 05/18/2021 0541   APPEARANCEUR CLEAR 05/18/2021 0541   LABSPEC 1.012 05/18/2021 0541   PHURINE 5.0 05/18/2021 0541   GLUCOSEU NEGATIVE 05/18/2021 0541   HGBUR NEGATIVE 05/18/2021 0541   BILIRUBINUR NEGATIVE 05/18/2021 0541   KETONESUR NEGATIVE 05/18/2021 0541   PROTEINUR 100 (A) 05/18/2021 0541   NITRITE NEGATIVE 05/18/2021 0541   LEUKOCYTESUR NEGATIVE 05/18/2021 0541   Sepsis Labs Invalid input(s): PROCALCITONIN,  WBC,  LACTICIDVEN Microbiology Recent Results (from the past 240 hour(s))  SARS CORONAVIRUS 2 (TAT 6-24 HRS) Nasopharyngeal Nasopharyngeal Swab     Status: None   Collection Time: 05/29/21  5:25 PM   Specimen: Nasopharyngeal Swab  Result Value Ref Range  Status   SARS Coronavirus 2 NEGATIVE NEGATIVE Final    Comment: (NOTE) SARS-CoV-2 target nucleic acids are NOT DETECTED.  The SARS-CoV-2 RNA is generally detectable in upper and lower respiratory specimens during the acute phase of infection. Negative results do not preclude SARS-CoV-2 infection, do not rule out co-infections with other pathogens, and should not be used as the sole basis for treatment or other patient management decisions. Negative results must be combined with clinical observations, patient history, and epidemiological information. The expected result is Negative.  Fact Sheet for Patients: HairSlick.no  Fact Sheet for Healthcare Providers: quierodirigir.com  This test is not yet approved or cleared by the [place] FDA and  has been authorized for detection and/or diagnosis of SARS-CoV-2 by FDA under an Emergency Use Authorization (EUA). This EUA will remain  in effect (meaning this test can be used) for the duration of the COVID-19 declaration under Se ction 564(b)(1) of the Act, 21 U.S.C. section 360bbb-3(b)(1), unless the authorization is terminated or revoked sooner.  Performed at [facility], [address] [place], [place] [address]      Time coordinating discharge:  40 minutes  SIGNED:   Dorcas Carrow, MD  [facility] 06/03/2021, 11:39 AM

## 2021-06-03 NOTE — TOC Transition Note (Addendum)
Transition of Care [facility]) - CM/SW Discharge Note   Patient Details  Name: Justin Richmond MRN: [id number] Date of Birth: [birth year]  [facility] CM/SW Contact:  Lanier Clam, RN Phone Number: 06/03/2021, 12:03 PM   Clinical Narrative: Sherron Monday to dtr [name] informed her that [facility] SNF rep Clive-family to provide condom cath supplies-facility can support the care but they do not provide condom caths-dtr [name] voiced understanding. [facility]on has a bed -awaiting d/c summary.Await covid results. [facility] for transport once all completed. 12:42p-covid results back neg.d/c summary sent.Noted on d/c summary-family to provide own condom cath-facility to support care of condom cath per protocal-Rep [facility] aware & agree. RM#[id number],nsg call report tel# [phone],ask for unit mgr pick up 4:30p per facility request. [facility] called for 4:30p pick up. Forms @ nsg station. No further CM needs.    D/c SNF Barriers to Discharge: No Barriers Identified   Patient Goals and CMS Choice Patient states their goals for this hospitalization and ongoing recovery are:: go home CMS Medicare.gov Compare Post Acute Care list provided to:: Patient Represenative (must comment) Choice offered to / list presented to : Patient, Adult Children  Discharge Placement PASRR number recieved: 05/28/21            Patient chooses bed at: [facility] Patient to be transferred to facility by: [facility] Name of family member notified: [name] dtr [phone] Patient and family notified of of transfer: 06/03/21  Discharge Plan and Services   Discharge Planning Services: CM Consult Post Acute Care Choice: Home Health                    HH Arranged: PT [facility] Agency: [facility] Date [facility] Agency Contacted: 05/22/21 Time HH Agency Contacted: [phone] Representative spoke with at [facility] Agency: Misty Stanley  Social Determinants of Health (SDOH) Interventions     Readmission Risk Interventions No flowsheet data  found.

## 2021-06-03 NOTE — Telephone Encounter (Signed)
Patient's daughter is requesting to speak with Dr. Odessa Fleming nurse regarding the patient's condition. He is currently admitted and was seen by Dr. Anne Fu in the hospital.

## 2021-06-03 NOTE — Progress Notes (Addendum)
Attempted to call report top manager at [facility] at [phone], phone rang 7 times without answer [facility] to transport, IV and telemetry monitor removed, personal belongings with patient at bedside

## 2021-06-03 NOTE — Progress Notes (Signed)
Nutrition Follow-up  DOCUMENTATION CODES:   Not applicable  INTERVENTION:  - continue Ensure Enlive BID, or equivalent, at SNF.    NUTRITION DIAGNOSIS:   Increased nutrient needs related to acute illness as evidenced by estimated needs. -ongoing  GOAL:   Patient will meet greater than or equal to 90% of their needs -met on average  MONITOR:   PO intake, Supplement acceptance, Labs, Weight trends  ASSESSMENT:   87 year old male with medical history of hypercholesterolemia, HTN, hemorrhoids, chronic constipation, spinal stenosis, migraines, hearing loss, scoliosis, osteoarthritis, CHF, chronic back pain, and interstitial lung disease. He presented to the ED d/t worsening SOB, cough, fever, and chills x3 days.  Recently documented meal intakes: 7/17- 95% of breakfast 7/19- 100% of breakfast 7/20- 100% of dinner 7/21- 100% of breakfast and 100% of lunch 7/24- 100% of breakfast 7/25- 20% of breakfast   He has accepted Ensure supplement all but 1 time over the past 1 week.   He was last weighed on 7/23 at which time weight had been stable x2 days, trending down from admission. No edema present at this time.  Discharge order and discharge summary for d/c to SNF entered a short time ago.    Labs reviewed; Ca: 8.5 mg/dl. Medications reviewed; 100 mg colace BID, 500 mg magonate/day, 20 mg oral protonix/day, 17 g miralax/day, 5000 mcg oral cyanocobalamin/day.    Diet Order:   Diet Order             Diet general           Diet Heart Room service appropriate? Yes; Fluid consistency: Thin  Diet effective now                   EDUCATION NEEDS:   No education needs have been identified at this time  Skin:  Skin Assessment: Skin Integrity Issues: Skin Integrity Issues:: Stage I, Stage II Stage I: coccyx Stage II: L buttocks  Last BM:  7/20 (type 5)  Height:   Ht Readings from Last 1 Encounters:  05/22/21 _0  (1.727 m)    Weight:   Wt Readings from Last  1 Encounters:  06/01/21 57 kg     Estimated Nutritional Needs:  Kcal:  1600-1850 kcal Protein:  70-85 grams Fluid:  >/= 1.8 L/day      Jarome Matin, MS, RD, LDN, CNSC Inpatient Clinical Dietitian RD pager # available in AMION  After hours/weekend pager # available in [facility]

## 2021-06-03 NOTE — Plan of Care (Signed)
  Problem: Respiratory: Goal: Ability to maintain adequate ventilation will improve Outcome: Adequate for Discharge Goal: Ability to maintain a clear airway will improve Outcome: Adequate for Discharge

## 2021-06-03 NOTE — Telephone Encounter (Signed)
Spoke with pt's daughter, [name], [place] who states she wanted to make Dr Graciela Husbands aware of her father's hospitalization.  Pt has developed severe orthostatic hypotension and placed on Midodrine, compression stockings and an abdominal binder.  Pt will be going to SNF tonight for PT and OT and hopefully discharged back to home to live with daughter.   Pt's daughter advised will make Dr Graciela Husbands aware of current situation and will contact if Dr Graciela Husbands has any further recommendations.  Pt's regular f/u is not due until 03/2022. Pt's daughter verbalizes understanding and thanked [employer] fundraiser for the call.

## 2021-06-03 NOTE — Progress Notes (Signed)
Physical Therapy Treatment Patient Details Name: Justin Richmond MRN: [id number] DOB: April 17, 1924 Today's Date: 06/03/2021    History of Present Illness 87 yo male admitted with Pna, sepsis. Hx of HB, PPM, CHF, OA, recurrent UTI, chronic pain, scoliosis, L knee fusion/leg length discrepancy, spinal stenosis, ILD    PT Comments    Attempted PT tx session. Had RN assist with donning TED hose in preparation for working with PT. Plan was to check orthostatics to assess patient's response to mobilizing and to see if ABD binder and TED hose helped any. Assessed supine BP-took multiple readings: 199/103 then 199/102 (both in R arm). Deferred mobility and notified RN and NT. Explained to pt that I will hold on working with him until nursing feels it is okay to proceed. Pt stated that he has plans for possible d/c on today. Will check back if schedule allows  Follow Up Recommendations        Equipment Recommendations       Recommendations for Other Services       Precautions / Restrictions Precautions Precautions: Fall Precaution Comments: monitor O2; monitor BP Restrictions Weight Bearing Restrictions: No    Mobility  Bed Mobility                    Transfers                    Ambulation/Gait                 Stairs             Wheelchair Mobility    Modified Rankin (Stroke Patients Only)       Balance                                            Cognition Arousal/Alertness: Awake/alert Behavior During Therapy: WFL for tasks assessed/performed Overall Cognitive Status: Within Functional Limits for tasks assessed                                        Exercises      General Comments        Pertinent Vitals/Pain Pain Assessment: Faces Faces Pain Scale: Hurts a little bit Pain Location: sacrum when supine Pain Descriptors / Indicators: Discomfort;Sore Pain Intervention(s): Monitored during session     Home Living                      Prior Function            PT Goals (current goals can now be found in the care plan section)      Frequency           PT Plan      Co-evaluation              AM-PAC PT "6 Clicks" Mobility   Outcome Measure                   End of Session               Time: 6060-0459 PT Time Calculation (min) (ACUTE ONLY): 23 min  Charges:  $Therapeutic Activity: 8-22 mins  Faye Ramsay, PT Acute Rehabilitation  Office: [phone] Pager: [phone]

## 2021-06-03 NOTE — Progress Notes (Addendum)
Progress Note  Patient Name: Justin Richmond Date of Encounter: 06/03/2021  [facility] Cardiologist: Berton Mount, MD  Subjective   Symptoms of orthostasis have improved.  No chest pain, no significant shortness of breath.  Inpatient Medications    Scheduled Meds:  arformoterol  15 mcg Nebulization BID   And   umeclidinium bromide  1 puff Inhalation Daily   And   budesonide (PULMICORT) nebulizer solution  1 mg Nebulization BID   aspirin EC  81 mg Oral Daily   docusate sodium  100 mg Oral BID   enoxaparin (LOVENOX) injection  40 mg Subcutaneous Q24H   feeding supplement  237 mL Oral BID BM   gabapentin  100 mg Oral QHS   hydrocortisone  25 mg Rectal BID   magnesium gluconate  500 mg Oral QHS   midodrine  10 mg Oral TID WC   pantoprazole  20 mg Oral Daily   polyethylene glycol  17 g Oral Daily   rOPINIRole  0.25 mg Oral QHS   rosuvastatin  10 mg Oral QHS   vitamin B-12  5,000 mcg Oral Daily   Continuous Infusions:  PRN Meds: acetaminophen **OR** acetaminophen, albuterol, lip balm, witch hazel-glycerin   Vital Signs    Vitals:   06/02/21 2040 06/02/21 2120 06/03/21 0519 06/03/21 0807  BP:  (!) 169/85    Pulse:  74 88   Resp:  20 20   Temp:  98 F (36.7 C) 97.9 F (36.6 C)   TempSrc:  Oral Oral   SpO2: 95% 93% 95% 94%  Weight:      Height:        Intake/Output Summary (Last 24 hours) at 06/03/2021 1035 Last data filed at 06/03/2021 0954 Gross per 24 hour  Intake 600 ml  Output 1800 ml  Net -1200 ml   Last 3 Weights 06/01/2021 05/31/2021 05/30/2021  Weight (lbs) 125 lb 10.6 oz 128 lb 8.5 oz 126 lb 1.7 oz  Weight (kg) 57 kg 58.3 kg 57.2 kg      Telemetry    Normal functioning pacemaker, sinus rhythm atrial sensing ventricular pacing.  Heart rate increases appropriately with increased sinus node activity.- Personally Reviewed  ECG    No new- Personally Reviewed  Physical Exam  Elderly GEN: No acute distress.   Neck: No JVD Cardiac: RRR, no  murmurs, rubs, or gallops.  Respiratory: Clear to auscultation bilaterally. GI: Soft, nontender, non-distended  MS: No edema; No deformity. Neuro:  Nonfocal  Psych: Normal affect   Labs    High Sensitivity Troponin:  No results for input(s): TROPONINIHS in the last 720 hours.    Chemistry Recent Labs  Lab 05/27/21 1122 06/02/21 0521  NA 138 139  K 3.7 4.1  CL 95* 104  CO2 33* 28  GLUCOSE 161* 100*  BUN 35* 17  CREATININE 0.88 0.64  CALCIUM 8.8* 8.5*  PROT  --  6.2*  ALBUMIN  --  2.6*  AST  --  25  ALT  --  27  ALKPHOS  --  53  BILITOT  --  0.6  GFRNONAA >60 >60  ANIONGAP 10 7     HematologyNo results for input(s): WBC, RBC, HGB, HCT, MCV, MCH, MCHC, RDW, PLT in the last 168 hours.  BNPNo results for input(s): BNP, PROBNP in the last 168 hours.   DDimer No results for input(s): DDIMER in the last 168 hours.   Radiology    No results found.  Cardiac Studies   Echocardiogram 05/18/2021  shows EF 55% RV normal no significant valvular abnormalities.   Patient Profile     87 y.o. male here with syncope, orthostatic hypotension, complete heart block status post [facility] [employer] pacemaker, spinal stenosis with bilateral neuropathy, prior COVID December 2021, interstitial lung disease hypertension hyperlipidemia.  Assessment & Plan    Orthostatic hypotension - Abdominal binder - Thigh-high hose - Midodrine shows improvement. -Florinef has been discontinued because it can exacerbate fluid retention and did result in some increased blood pressure. -Cosyntropin stim test negative for adrenal insufficiency. -Okay to continue to hold carvedilol.  Last check orthostatic vital signs showed 153/84 laying down, 137/69 after standing for 3 minutes.  Improved.  Heart rate did increase from 84-118, remember he does have a pacemaker.  Acute on chronic diastolic heart failure - First 2 days was given Lasix then orthostatic hypotension became more profound the diuretic was  discontinued.  He was given back a liter of normal saline.  Seems stable now. -Remember, with his underlying interstitial lung disease, crackles will be present continuously on lung exam.  Seems euvolemic now.  Pacemaker - Complete heart block.  Doing well.  Functioning normally.  Rhythm with atrial sensing ventricular pacing.  Interstitial lung disease - Sees pulmonary as outpatient.  No new recommendations. We will go ahead and sign off.  Please let us know if we can be of further assistance.   For questions or updates, please contact [facility] Please consult [email or web address] for contact info under        Signed, Donato Schultz, MD  06/03/2021, 10:35 AM

## 2021-06-05 NOTE — Telephone Encounter (Signed)
Called and spoke with his daughter  Her dad is in Rehab OT and PT started

## 2021-06-06 ENCOUNTER — Telehealth: Payer: Self-pay | Admitting: Internal Medicine

## 2021-06-06 NOTE — Telephone Encounter (Signed)
New Message:    Marchelle Folks from [facility] called. She said the patient's daughter said that Dr Graciela Husbands was changing some of the patient's medicine. If this so, she will need an order faxed to [phone].

## 2021-06-13 ENCOUNTER — Telehealth: Payer: Self-pay | Admitting: Internal Medicine

## 2021-06-18 NOTE — Telephone Encounter (Signed)
Attempted phone call to [facility] with [facility] rehab.  Left message to return call to RN at [phone]

## 2021-06-21 ENCOUNTER — Telehealth: Payer: Self-pay | Admitting: Internal Medicine

## 2021-06-21 NOTE — Telephone Encounter (Signed)
Spoke to daughter [name] and she is concerned about her dads BP running to high after being started on Midodrine in the hospital, when he developed severe orthostatic hypotension. [facility] did not have BP readings to give therefore will call [facility]. The medication has been reduce to 5 mg BID from MD at facility. ~ week a half ago.   Today 166/78 midodrine held once today. Yesterday 170/95 held midodrine. Nursing staff requesting for patient to have hospital follow up to reevaluate medications.   Per MD Graciela Husbands to continue Midodrine 5 mg BID, Give on arising in the morning and after nap time when arising or if he does nap, right after lunch. Graciela Husbands spoke with son in law today who stated his BP was still dropping when he stands up). Agrees patient will need follow up after he leaves rehab.

## 2021-06-21 NOTE — Telephone Encounter (Signed)
Pt c/o medication issue:  1. Name of Medication: midodrine (PROAMATINE) 10 MG tablet  2. How are you currently taking this medication (dosage and times per day)? Take 1 tablet (10 mg total) by mouth 3 (three) times daily with meals.  3. Are you having a reaction (difficulty breathing--STAT)? Making patients blood pressure really high   4. What is your medication issue? Pt blood pressure is not being regulated with this medication.. pts daughter is needing additional options.. please advise?

## 2021-07-31 ENCOUNTER — Ambulatory Visit (INDEPENDENT_AMBULATORY_CARE_PROVIDER_SITE_OTHER): Payer: Medicare Other

## 2021-07-31 DIAGNOSIS — I442 Atrioventricular block, complete: Secondary | ICD-10-CM

## 2021-07-31 LAB — CUP PACEART REMOTE DEVICE CHECK
Battery Remaining Longevity: 66 mo
Battery Remaining Percentage: 100 %
Brady Statistic RA Percent Paced: 4 %
Brady Statistic RV Percent Paced: 99 %
Date Time Interrogation Session: 20220921002200
Implantable Lead Implant Date: 20090901
Implantable Lead Implant Date: 20090901
Implantable Lead Location: 753859
Implantable Lead Location: 753860
Implantable Lead Model: 4076
Implantable Lead Model: 4076
Implantable Pulse Generator Implant Date: 20180723
Lead Channel Impedance Value: 332 Ohm
Lead Channel Impedance Value: 857 Ohm
Lead Channel Pacing Threshold Amplitude: 1 V
Lead Channel Pacing Threshold Pulse Width: 0.4 ms
Lead Channel Setting Pacing Amplitude: 1.2 V
Lead Channel Setting Pacing Amplitude: 2 V
Lead Channel Setting Pacing Pulse Width: 0.4 ms
Lead Channel Setting Sensing Sensitivity: 2.5 mV
Pulse Gen Serial Number: 357327

## 2021-08-07 NOTE — Progress Notes (Signed)
Remote pacemaker transmission.   

## 2021-08-30 ENCOUNTER — Ambulatory Visit: Payer: Medicare Other | Admitting: Podiatry

## 2021-10-30 ENCOUNTER — Ambulatory Visit (INDEPENDENT_AMBULATORY_CARE_PROVIDER_SITE_OTHER)

## 2021-10-30 DIAGNOSIS — I442 Atrioventricular block, complete: Secondary | ICD-10-CM

## 2021-10-30 LAB — CUP PACEART REMOTE DEVICE CHECK
Battery Remaining Longevity: 54 mo
Battery Remaining Percentage: 87 %
Brady Statistic RA Percent Paced: 3 %
Brady Statistic RV Percent Paced: 98 %
Date Time Interrogation Session: 20221221035500
Implantable Lead Implant Date: 20090901
Implantable Lead Implant Date: 20090901
Implantable Lead Location: 753859
Implantable Lead Location: 753860
Implantable Lead Model: 4076
Implantable Lead Model: 4076
Implantable Pulse Generator Implant Date: 20180723
Lead Channel Impedance Value: 295 Ohm
Lead Channel Impedance Value: 768 Ohm
Lead Channel Pacing Threshold Amplitude: 1 V
Lead Channel Pacing Threshold Pulse Width: 0.4 ms
Lead Channel Setting Pacing Amplitude: 2 V
Lead Channel Setting Pacing Amplitude: 3.5 V
Lead Channel Setting Pacing Pulse Width: 0.4 ms
Lead Channel Setting Sensing Sensitivity: 2.5 mV
Pulse Gen Serial Number: 357327

## 2021-11-08 NOTE — Progress Notes (Signed)
Remote pacemaker transmission.   

## 2021-12-04 ENCOUNTER — Ambulatory Visit: Payer: Medicare Other | Admitting: Podiatry

## 2022-02-04 ENCOUNTER — Ambulatory Visit (INDEPENDENT_AMBULATORY_CARE_PROVIDER_SITE_OTHER): Payer: Medicare Other

## 2022-02-04 DIAGNOSIS — I442 Atrioventricular block, complete: Secondary | ICD-10-CM | POA: Diagnosis not present

## 2022-02-04 LAB — CUP PACEART REMOTE DEVICE CHECK
Battery Remaining Longevity: 60 mo
Battery Remaining Percentage: 96 %
Brady Statistic RA Percent Paced: 3 %
Brady Statistic RV Percent Paced: 98 %
Date Time Interrogation Session: 20230327223800
Implantable Lead Implant Date: 20090901
Implantable Lead Implant Date: 20090901
Implantable Lead Location: 753859
Implantable Lead Location: 753860
Implantable Lead Model: 4076
Implantable Lead Model: 4076
Implantable Pulse Generator Implant Date: 20180723
Lead Channel Impedance Value: 294 Ohm
Lead Channel Impedance Value: 760 Ohm
Lead Channel Pacing Threshold Amplitude: 1.1 V
Lead Channel Pacing Threshold Pulse Width: 0.4 ms
Lead Channel Setting Pacing Amplitude: 1.4 V
Lead Channel Setting Pacing Amplitude: 2 V
Lead Channel Setting Pacing Pulse Width: 0.4 ms
Lead Channel Setting Sensing Sensitivity: 2.5 mV
Pulse Gen Serial Number: 357327

## 2022-02-18 NOTE — Progress Notes (Signed)
Remote pacemaker transmission.   

## 2022-04-11 IMAGING — CR DG CHEST 2V
2 series · 2 of 2 positions shown · non-contrast
Comparison: Chest x-ray 03/10/2021, CT chest 03/14/2021

CLINICAL DATA: Pt arrived via EMS from home with SHOB. Pt has wet
cough. Pmhx of pacemaker and urinary retention. Pt states he has had
urinary retention for 3 days. Pt denies chest pain, and has a hx of
HTN, and CHF.

EXAM:
CHEST - 2 VIEW

[w chest lat]
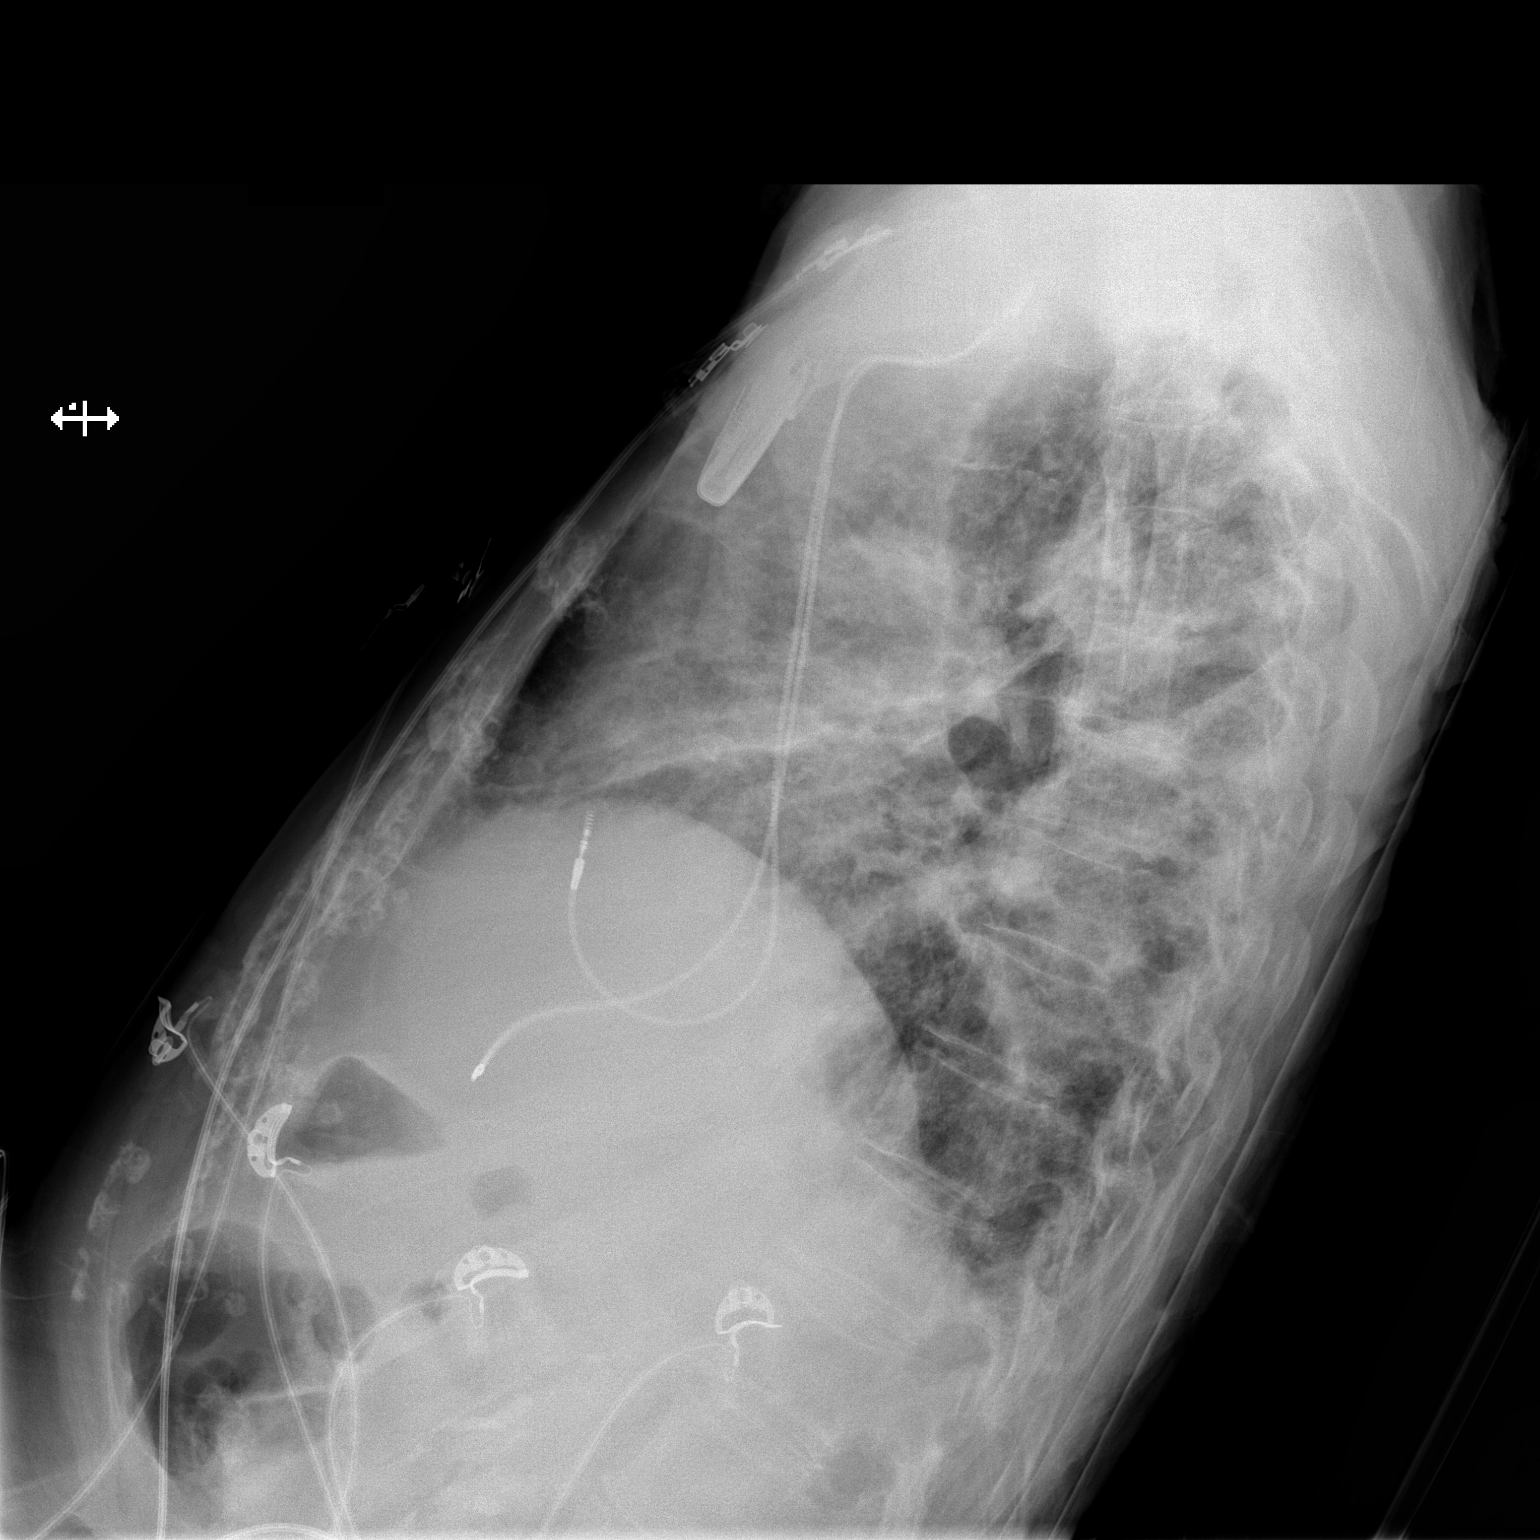

[x chest ap]
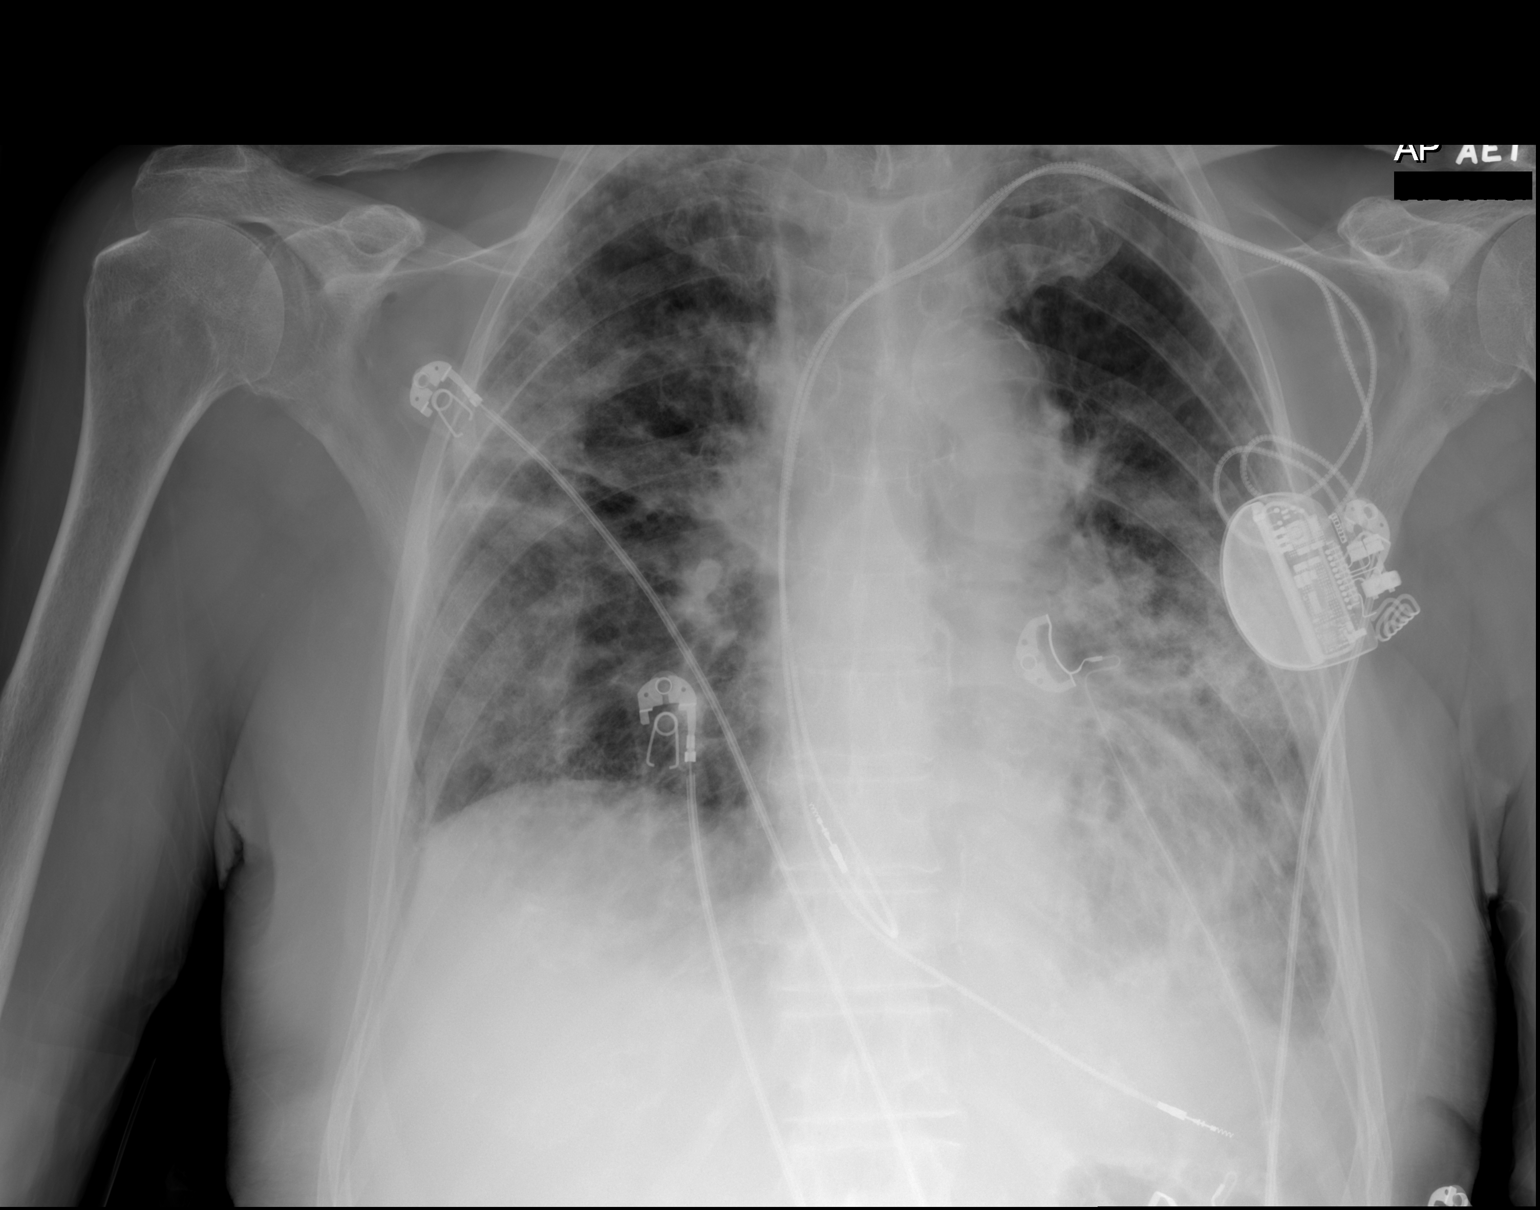

[2 of 2 positions shown; findings below may reference images not displayed]

FINDINGS: The heart size and mediastinal contours are unchanged. Aortic
calcification. Left chest wall dual lead cardiac pacemaker in
similar position.

Biapical pleural/pulmonary scarring. Interval increase in diffuse
patchy airspace opacities. Similar appearing coarsened interstitial
markings. Possible trace left pleural effusion. No right pleural
effusion. No pneumothorax.

No acute osseous abnormality.
IMPRESSION: 1. Chronic interstitial lung disease with superimposed
infection/inflammation.
2. Possible trace left pleural effusion

## 2022-04-18 IMAGING — DX DG CHEST 1V PORT
1 series · 1 of 1 positions shown · non-contrast
Comparison: 05/19/2021.

CLINICAL DATA: CHF.

EXAM:
PORTABLE CHEST 1 VIEW

[chest ap]
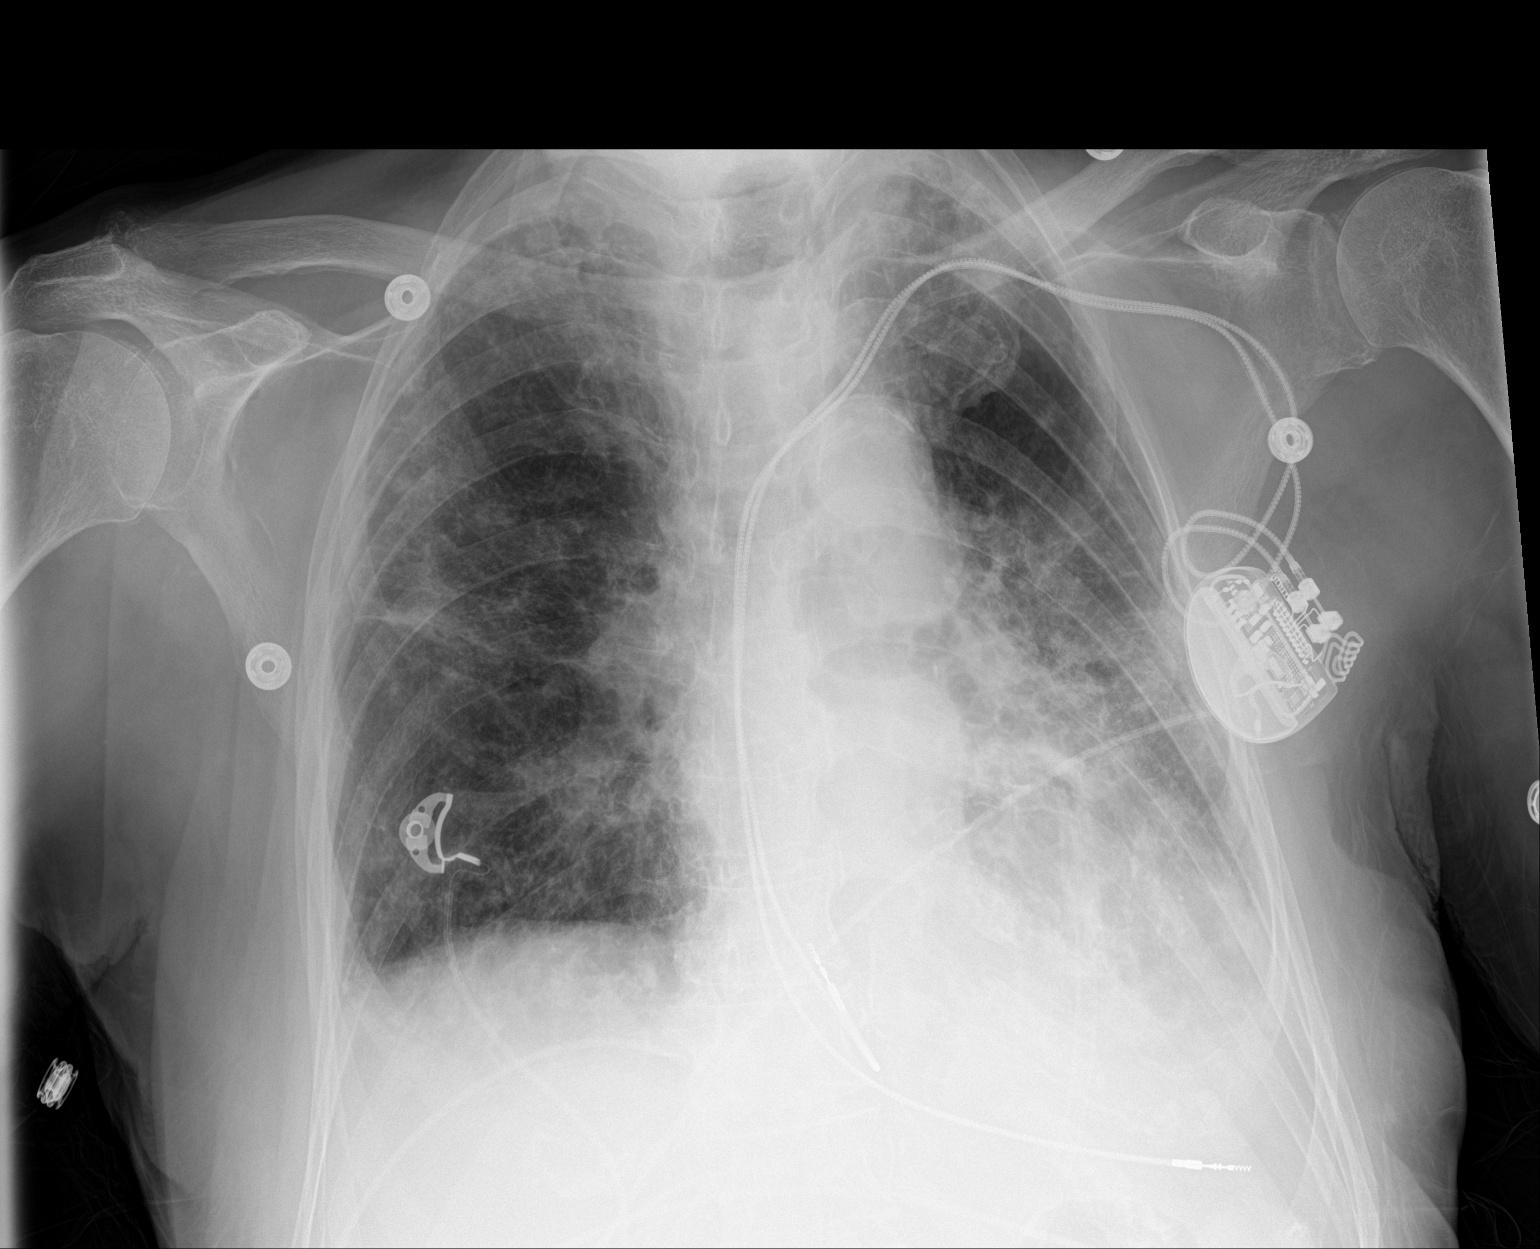

[1 of 1 positions shown; findings below may reference images not displayed]

FINDINGS: Chronic coarsened interstitial markings with improved superimposed
patchy bilateral airspace opacities. Chronic interstitial lung
disease was better characterized on prior CT chest from March 14, 2021.
No visible pleural effusions or pneumothorax on this single semi
erect AP radiograph. Cardiac silhouette is largely obscured. Aortic
atherosclerosis. Left subclavian approach dual lead cardiac rhythm
maintenance device in similar position. Polyarticular degenerative
change.
IMPRESSION: Findings suggestive of improving multifocal pneumonia and/or edema
superimposed on chronic interstitial lung disease.

## 2022-05-10 DEATH — deceased
